# Patient Record
Sex: Female | Born: 1935 | Race: White | Hispanic: No | Marital: Married | Smoking: Former smoker
Health system: Southern US, Community
[De-identification: ages and names within clinical notes are randomized; demographics above are authoritative.]

## PROBLEM LIST (undated history)

## (undated) DIAGNOSIS — E785 Hyperlipidemia, unspecified: Secondary | ICD-10-CM

## (undated) DIAGNOSIS — E119 Type 2 diabetes mellitus without complications: Secondary | ICD-10-CM

## (undated) DIAGNOSIS — I10 Essential (primary) hypertension: Secondary | ICD-10-CM

## (undated) DIAGNOSIS — N390 Urinary tract infection, site not specified: Secondary | ICD-10-CM

## (undated) DIAGNOSIS — D649 Anemia, unspecified: Secondary | ICD-10-CM

## (undated) DIAGNOSIS — G20 Parkinson's disease: Secondary | ICD-10-CM

## (undated) DIAGNOSIS — F17201 Nicotine dependence, unspecified, in remission: Secondary | ICD-10-CM

## (undated) DIAGNOSIS — I471 Supraventricular tachycardia: Secondary | ICD-10-CM

## (undated) DIAGNOSIS — G459 Transient cerebral ischemic attack, unspecified: Secondary | ICD-10-CM

## (undated) DIAGNOSIS — N200 Calculus of kidney: Secondary | ICD-10-CM

## (undated) DIAGNOSIS — G20A1 Parkinson's disease without dyskinesia, without mention of fluctuations: Secondary | ICD-10-CM

## (undated) DIAGNOSIS — K219 Gastro-esophageal reflux disease without esophagitis: Secondary | ICD-10-CM

## (undated) DIAGNOSIS — E669 Obesity, unspecified: Secondary | ICD-10-CM

## (undated) DIAGNOSIS — E039 Hypothyroidism, unspecified: Secondary | ICD-10-CM

## (undated) DIAGNOSIS — I2510 Atherosclerotic heart disease of native coronary artery without angina pectoris: Secondary | ICD-10-CM

## (undated) HISTORY — DX: Transient cerebral ischemic attack, unspecified: G45.9

## (undated) HISTORY — DX: Calculus of kidney: N20.0

## (undated) HISTORY — PX: TOTAL ABDOMINAL HYSTERECTOMY: SHX209

## (undated) HISTORY — PX: APPENDECTOMY: SHX54

## (undated) HISTORY — DX: Obesity, unspecified: E66.9

## (undated) HISTORY — PX: KIDNEY STONE SURGERY: SHX686

## (undated) HISTORY — DX: Anemia, unspecified: D64.9

## (undated) HISTORY — DX: Urinary tract infection, site not specified: N39.0

## (undated) HISTORY — DX: Supraventricular tachycardia: I47.1

## (undated) HISTORY — DX: Type 2 diabetes mellitus without complications: E11.9

## (undated) HISTORY — DX: Hypothyroidism, unspecified: E03.9

## (undated) HISTORY — DX: Hyperlipidemia, unspecified: E78.5

## (undated) HISTORY — DX: Parkinson's disease: G20

## (undated) HISTORY — DX: Nicotine dependence, unspecified, in remission: F17.201

## (undated) HISTORY — DX: Atherosclerotic heart disease of native coronary artery without angina pectoris: I25.10

## (undated) HISTORY — DX: Parkinson's disease without dyskinesia, without mention of fluctuations: G20.A1

## (undated) HISTORY — DX: Gastro-esophageal reflux disease without esophagitis: K21.9

---

## 1992-02-13 DIAGNOSIS — I2510 Atherosclerotic heart disease of native coronary artery without angina pectoris: Secondary | ICD-10-CM

## 1992-02-13 HISTORY — DX: Atherosclerotic heart disease of native coronary artery without angina pectoris: I25.10

## 2001-03-18 ENCOUNTER — Ambulatory Visit (HOSPITAL_COMMUNITY): Admission: RE | Admit: 2001-03-18 | Discharge: 2001-03-18 | Payer: Self-pay | Admitting: Cardiovascular Disease

## 2003-12-16 ENCOUNTER — Ambulatory Visit: Payer: Self-pay | Admitting: Family Medicine

## 2004-01-20 ENCOUNTER — Ambulatory Visit: Payer: Self-pay | Admitting: Family Medicine

## 2004-02-08 ENCOUNTER — Ambulatory Visit: Payer: Self-pay | Admitting: *Deleted

## 2004-02-13 DIAGNOSIS — I471 Supraventricular tachycardia, unspecified: Secondary | ICD-10-CM

## 2004-02-13 HISTORY — DX: Supraventricular tachycardia, unspecified: I47.10

## 2004-02-13 HISTORY — DX: Supraventricular tachycardia: I47.1

## 2004-02-13 HISTORY — PX: RADIOFREQUENCY ABLATION: SHX2290

## 2004-05-10 ENCOUNTER — Ambulatory Visit: Payer: Self-pay | Admitting: Family Medicine

## 2004-07-27 ENCOUNTER — Ambulatory Visit: Payer: Self-pay | Admitting: Family Medicine

## 2004-08-08 ENCOUNTER — Encounter: Admission: RE | Admit: 2004-08-08 | Discharge: 2004-10-03 | Payer: Self-pay | Admitting: Family Medicine

## 2004-08-17 ENCOUNTER — Ambulatory Visit: Payer: Self-pay | Admitting: Family Medicine

## 2004-08-29 ENCOUNTER — Ambulatory Visit: Payer: Self-pay | Admitting: Family Medicine

## 2004-09-25 ENCOUNTER — Ambulatory Visit: Payer: Self-pay | Admitting: Family Medicine

## 2004-11-16 ENCOUNTER — Ambulatory Visit: Payer: Self-pay | Admitting: Family Medicine

## 2004-11-22 ENCOUNTER — Ambulatory Visit: Payer: Self-pay | Admitting: Family Medicine

## 2004-12-20 ENCOUNTER — Ambulatory Visit: Payer: Self-pay | Admitting: Family Medicine

## 2004-12-20 ENCOUNTER — Inpatient Hospital Stay (HOSPITAL_COMMUNITY): Admission: EM | Admit: 2004-12-20 | Discharge: 2004-12-22 | Payer: Self-pay | Admitting: Emergency Medicine

## 2004-12-21 ENCOUNTER — Ambulatory Visit: Payer: Self-pay | Admitting: Internal Medicine

## 2005-01-02 ENCOUNTER — Ambulatory Visit: Payer: Self-pay | Admitting: Cardiology

## 2005-02-12 HISTORY — PX: LUMBAR SPINE SURGERY: SHX701

## 2005-02-17 ENCOUNTER — Ambulatory Visit: Payer: Self-pay | Admitting: Internal Medicine

## 2005-02-27 ENCOUNTER — Ambulatory Visit: Payer: Self-pay | Admitting: Family Medicine

## 2005-04-10 ENCOUNTER — Ambulatory Visit: Payer: Self-pay | Admitting: Family Medicine

## 2005-05-09 ENCOUNTER — Ambulatory Visit (HOSPITAL_COMMUNITY): Admission: RE | Admit: 2005-05-09 | Discharge: 2005-05-09 | Payer: Self-pay | Admitting: Neurosurgery

## 2005-05-17 ENCOUNTER — Ambulatory Visit: Payer: Self-pay | Admitting: Family Medicine

## 2005-06-08 ENCOUNTER — Ambulatory Visit: Payer: Self-pay | Admitting: Family Medicine

## 2005-06-27 ENCOUNTER — Ambulatory Visit: Payer: Self-pay | Admitting: Family Medicine

## 2005-07-05 ENCOUNTER — Ambulatory Visit: Payer: Self-pay | Admitting: Family Medicine

## 2005-07-23 ENCOUNTER — Inpatient Hospital Stay (HOSPITAL_COMMUNITY): Admission: RE | Admit: 2005-07-23 | Discharge: 2005-07-24 | Payer: Self-pay | Admitting: Neurosurgery

## 2005-09-14 ENCOUNTER — Ambulatory Visit (HOSPITAL_COMMUNITY): Admission: RE | Admit: 2005-09-14 | Discharge: 2005-09-14 | Payer: Self-pay | Admitting: Neurosurgery

## 2005-11-19 ENCOUNTER — Ambulatory Visit: Payer: Self-pay | Admitting: Family Medicine

## 2005-12-14 ENCOUNTER — Ambulatory Visit: Payer: Self-pay | Admitting: Family Medicine

## 2006-03-20 ENCOUNTER — Ambulatory Visit: Payer: Self-pay | Admitting: Family Medicine

## 2006-06-26 ENCOUNTER — Ambulatory Visit: Payer: Self-pay | Admitting: Family Medicine

## 2009-02-06 ENCOUNTER — Emergency Department (HOSPITAL_COMMUNITY): Admission: EM | Admit: 2009-02-06 | Discharge: 2009-02-06 | Payer: Self-pay | Admitting: Emergency Medicine

## 2010-01-17 ENCOUNTER — Emergency Department (HOSPITAL_COMMUNITY)
Admission: EM | Admit: 2010-01-17 | Discharge: 2010-01-17 | Payer: Self-pay | Source: Home / Self Care | Admitting: Emergency Medicine

## 2010-01-24 ENCOUNTER — Ambulatory Visit (HOSPITAL_COMMUNITY)
Admission: RE | Admit: 2010-01-24 | Discharge: 2010-01-24 | Payer: Self-pay | Source: Home / Self Care | Attending: Family Medicine | Admitting: Family Medicine

## 2010-02-07 ENCOUNTER — Ambulatory Visit: Payer: Self-pay | Admitting: Internal Medicine

## 2010-03-01 ENCOUNTER — Ambulatory Visit (HOSPITAL_COMMUNITY)
Admission: RE | Admit: 2010-03-01 | Discharge: 2010-03-01 | Payer: Self-pay | Source: Home / Self Care | Attending: Internal Medicine | Admitting: Internal Medicine

## 2010-03-01 ENCOUNTER — Ambulatory Visit: Admit: 2010-03-01 | Payer: Self-pay | Admitting: Internal Medicine

## 2010-03-06 LAB — GLUCOSE, CAPILLARY: Glucose-Capillary: 98 mg/dL (ref 70–99)

## 2010-03-17 NOTE — Op Note (Signed)
  Jennifer [name], ACHORN                ACCOUNT NO.:  [id number]  MEDICAL RECORD NO.:  [id number]          PATIENT TYPE:  AMB  LOCATION:  DAY                           FACILITY:  [facility]  PHYSICIAN:  Lionel December, M.D.    DATE OF BIRTH:  11-01-1930  DATE OF PROCEDURE:  03/01/2010 DATE OF DISCHARGE:  03/01/2010                              OPERATIVE REPORT   INDICATION:  [name] is a 77 year old Caucasian female with chronic GERD, whose heart burn was well controlled with Nexium and she has recurrent solid food dysphagia.  She has Parkinson disease.  She had barium study which suggest motility disorder.  She is undergoing EGD to make sure she does not have ring or stricture that could be easily treated to improve her dysphagia.  Procedure risks were reviewed with the patient. Informed consent was obtained.  MEDICATIONS FOR CONSCIOUS SEDATION: 1. Cetacaine spray for pharyngeal topical anesthesia. 2. Demerol 20 mg IV. 3. Versed 3 mg IV.  FINDINGS:  Procedure performed in endoscopy suite.  The patient's vital signs and O2 sat were monitored during the procedure and remained stable.  The patient was placed in left lateral recumbent position and Pentax videoscope was passed through oropharynx without any difficulty into esophagus.  Esophagus, mucosa of the esophagus normal.  Body of the esophagus was somewhat tortuous.  GE junction located at 30 cm from the incisors with a prominent ring.  Scope was easily passed across into the herniated part of the stomach.  Hiatus was at 34 cm.  Hernia was felt to be moderate size.  Stomach was emptied and distended very well with insufflation.  Folds of proximal stomach was normal.  Examination of the mucosa, body, antrum, pyloric channel as well as angularis, fundus and cardia was normal.  Duodenum and bulbar mucosa was normal.  Scope was passed to the second part of duodenum where mucosa and folds were normal.  Endoscope was withdrawn.  Esophagus  was dilated by passing 54-French Maloney dilator with full insertion.  As the dilators withdrawn, endoscope was passed again and esophageal mucosa reexamined and ring was noted to be disrupted.  Endoscope was withdrawn.  The patient tolerated the procedure well.  FINAL DIAGNOSES: 1. Schatzki ring which was dilated, disrupted by passing 54-French     Maloney dilator. 2. Moderate size sliding hiatal hernia.  RECOMMENDATIONS: 1. The patient will resume her Plavix and aspirin later this evening.     She will continue antireflux measures.  She has to chew her food     thoroughly since she may have an element of insufficient motility     disorder. 2. Prescription written for Nexium 40 mg p.o. q.a.m. 30 with 11     refills.     Lionel December, M.D.     NR/MEDQ  D:  03/01/2010  T:  03/02/2010  Job:  [id number]  cc:   Ladona Horns. Mariel Sleet, MD Fax: [phone]  Electronically Signed by Lionel December M.D. on 03/17/2010 12:57:53 PM

## 2010-04-25 LAB — BASIC METABOLIC PANEL
BUN: 21 mg/dL (ref 6–23)
CO2: 24 mEq/L (ref 19–32)
Calcium: 9.4 mg/dL (ref 8.4–10.5)
Chloride: 108 mEq/L (ref 96–112)
Creatinine, Ser: 1.08 mg/dL (ref 0.4–1.2)
GFR calc Af Amer: 60 mL/min — ABNORMAL LOW (ref >60)
GFR calc non Af Amer: 49 mL/min — ABNORMAL LOW (ref >60)
Glucose, Bld: 86 mg/dL (ref 70–99)
Potassium: 4 mEq/L (ref 3.5–5.1)
Sodium: 141 mEq/L (ref 135–145)

## 2010-04-25 LAB — POCT CARDIAC MARKERS
CKMB, poc: <1 ng/mL — ABNORMAL LOW (ref 1.0–8.0)
Myoglobin, poc: 67.6 ng/mL (ref 12–200)
Troponin i, poc: <0.05 ng/mL (ref 0.00–0.09)

## 2010-04-25 LAB — CBC
HCT: 35.6 % — ABNORMAL LOW (ref 36.0–46.0)
Hemoglobin: 11.8 g/dL — ABNORMAL LOW (ref 12.0–15.0)
MCH: 29.4 pg (ref 26.0–34.0)
MCHC: 33.1 g/dL (ref 30.0–36.0)
MCV: 88.6 fL (ref 78.0–100.0)
Platelets: 188 10*3/uL (ref 150–400)
RBC: 4.02 MIL/uL (ref 3.87–5.11)
RDW: 14.2 % (ref 11.5–15.5)
WBC: 5.3 10*3/uL (ref 4.0–10.5)

## 2010-06-27 ENCOUNTER — Observation Stay (HOSPITAL_COMMUNITY)
Admission: EM | Admit: 2010-06-27 | Discharge: 2010-06-28 | Disposition: A | Discharge status: Home / Self Care | Payer: Medicare Other | Source: Ambulatory Visit | Attending: Internal Medicine | Admitting: Internal Medicine

## 2010-06-27 ENCOUNTER — Encounter (HOSPITAL_COMMUNITY): Payer: Self-pay | Admitting: Radiology

## 2010-06-27 ENCOUNTER — Emergency Department (HOSPITAL_COMMUNITY): Payer: Medicare Other

## 2010-06-27 DIAGNOSIS — I2510 Atherosclerotic heart disease of native coronary artery without angina pectoris: Secondary | ICD-10-CM | POA: Insufficient documentation

## 2010-06-27 DIAGNOSIS — D696 Thrombocytopenia, unspecified: Secondary | ICD-10-CM | POA: Insufficient documentation

## 2010-06-27 DIAGNOSIS — G20 Parkinson's disease: Secondary | ICD-10-CM | POA: Insufficient documentation

## 2010-06-27 DIAGNOSIS — I10 Essential (primary) hypertension: Secondary | ICD-10-CM | POA: Insufficient documentation

## 2010-06-27 DIAGNOSIS — E875 Hyperkalemia: Secondary | ICD-10-CM | POA: Insufficient documentation

## 2010-06-27 DIAGNOSIS — D649 Anemia, unspecified: Secondary | ICD-10-CM | POA: Insufficient documentation

## 2010-06-27 DIAGNOSIS — N390 Urinary tract infection, site not specified: Secondary | ICD-10-CM | POA: Insufficient documentation

## 2010-06-27 DIAGNOSIS — K219 Gastro-esophageal reflux disease without esophagitis: Secondary | ICD-10-CM | POA: Insufficient documentation

## 2010-06-27 DIAGNOSIS — G20A1 Parkinson's disease without dyskinesia, without mention of fluctuations: Secondary | ICD-10-CM | POA: Insufficient documentation

## 2010-06-27 DIAGNOSIS — R0789 Other chest pain: Principal | ICD-10-CM | POA: Insufficient documentation

## 2010-06-27 DIAGNOSIS — Z79899 Other long term (current) drug therapy: Secondary | ICD-10-CM | POA: Insufficient documentation

## 2010-06-27 DIAGNOSIS — E119 Type 2 diabetes mellitus without complications: Secondary | ICD-10-CM | POA: Insufficient documentation

## 2010-06-27 HISTORY — DX: Essential (primary) hypertension: I10

## 2010-06-27 LAB — GLUCOSE, CAPILLARY
Glucose-Capillary: 65 mg/dL — ABNORMAL LOW (ref 70–99)
Glucose-Capillary: 156 mg/dL — ABNORMAL HIGH (ref 70–99)
Glucose-Capillary: 155 mg/dL — ABNORMAL HIGH (ref 70–99)
Glucose-Capillary: 101 mg/dL — ABNORMAL HIGH (ref 70–99)
Glucose-Capillary: 58 mg/dL — ABNORMAL LOW (ref 70–99)

## 2010-06-27 LAB — URINALYSIS, ROUTINE W REFLEX MICROSCOPIC
Bilirubin Urine: NEGATIVE
Glucose, UA: NEGATIVE mg/dL
Ketones, ur: NEGATIVE mg/dL
Nitrite: POSITIVE — AB
Protein, ur: 30 mg/dL — AB
Specific Gravity, Urine: 1.02 (ref 1.005–1.030)
Urobilinogen, UA: 0.2 mg/dL (ref 0.0–1.0)
pH: 6.5 (ref 5.0–8.0)

## 2010-06-27 LAB — DIFFERENTIAL
Basophils Absolute: 0 10*3/uL (ref 0.0–0.1)
Basophils Relative: 1 % (ref 0–1)
Eosinophils Absolute: 0.7 10*3/uL (ref 0.0–0.7)
Eosinophils Relative: 13 % — ABNORMAL HIGH (ref 0–5)
Lymphocytes Relative: 24 % (ref 12–46)
Lymphs Abs: 1.2 10*3/uL (ref 0.7–4.0)
Monocytes Absolute: 0.4 10*3/uL (ref 0.1–1.0)
Monocytes Relative: 8 % (ref 3–12)
Neutro Abs: 2.7 10*3/uL (ref 1.7–7.7)
Neutrophils Relative %: 54 % (ref 43–77)

## 2010-06-27 LAB — BASIC METABOLIC PANEL
BUN: 20 mg/dL (ref 6–23)
CO2: 24 mEq/L (ref 19–32)
Calcium: 9.8 mg/dL (ref 8.4–10.5)
Chloride: 106 mEq/L (ref 96–112)
Creatinine, Ser: 1.15 mg/dL (ref 0.4–1.2)
GFR calc Af Amer: 55 mL/min — ABNORMAL LOW (ref >60)
GFR calc non Af Amer: 46 mL/min — ABNORMAL LOW (ref >60)
Glucose, Bld: 109 mg/dL — ABNORMAL HIGH (ref 70–99)
Potassium: 5.1 mEq/L (ref 3.5–5.1)
Sodium: 140 mEq/L (ref 135–145)

## 2010-06-27 LAB — PROTIME-INR
INR: 0.89 (ref 0.00–1.49)
Prothrombin Time: 12.2 seconds (ref 11.6–15.2)

## 2010-06-27 LAB — CARDIAC PANEL(CRET KIN+CKTOT+MB+TROPI): Troponin I: <0.3 ng/mL (ref <0.30)

## 2010-06-27 LAB — CBC
HCT: 36.6 % (ref 36.0–46.0)
Hemoglobin: 11.7 g/dL — ABNORMAL LOW (ref 12.0–15.0)
MCH: 28.6 pg (ref 26.0–34.0)
MCHC: 32 g/dL (ref 30.0–36.0)
MCV: 89.5 fL (ref 78.0–100.0)
Platelets: 163 10*3/uL (ref 150–400)
RBC: 4.09 MIL/uL (ref 3.87–5.11)
RDW: 14.8 % (ref 11.5–15.5)
WBC: 5 10*3/uL (ref 4.0–10.5)

## 2010-06-27 LAB — POCT CARDIAC MARKERS
CKMB, poc: <1 ng/mL — ABNORMAL LOW (ref 1.0–8.0)
Myoglobin, poc: 59.9 ng/mL (ref 12–200)
Troponin i, poc: <0.05 ng/mL (ref 0.00–0.09)

## 2010-06-27 LAB — CK TOTAL AND CKMB (NOT AT ARMC)
CK, MB: 1.2 ng/mL (ref 0.3–4.0)
Relative Index: INVALID (ref 0.0–2.5)
Total CK: 35 U/L (ref 7–177)

## 2010-06-27 LAB — URINE MICROSCOPIC-ADD ON

## 2010-06-27 LAB — APTT: aPTT: 27 seconds (ref 24–37)

## 2010-06-27 LAB — TROPONIN I: Troponin I: <0.3 ng/mL (ref <0.30)

## 2010-06-28 DIAGNOSIS — I517 Cardiomegaly: Secondary | ICD-10-CM

## 2010-06-28 LAB — BASIC METABOLIC PANEL
BUN: 19 mg/dL (ref 6–23)
Creatinine, Ser: 1.21 mg/dL — ABNORMAL HIGH (ref 0.4–1.2)
GFR calc non Af Amer: 43 mL/min — ABNORMAL LOW (ref >60)

## 2010-06-28 LAB — COMPREHENSIVE METABOLIC PANEL
ALT: 6 U/L (ref 0–35)
AST: 10 U/L (ref 0–37)
Albumin: 3.1 g/dL — ABNORMAL LOW (ref 3.5–5.2)
Chloride: 107 mEq/L (ref 96–112)
Creatinine, Ser: 1.12 mg/dL (ref 0.4–1.2)
GFR calc Af Amer: 57 mL/min — ABNORMAL LOW (ref >60)
Sodium: 140 mEq/L (ref 135–145)
Total Bilirubin: 0.2 mg/dL — ABNORMAL LOW (ref 0.3–1.2)

## 2010-06-28 LAB — LIPID PANEL
HDL: 44 mg/dL (ref >39)
LDL Cholesterol: 49 mg/dL (ref 0–99)
Triglycerides: 131 mg/dL (ref <150)

## 2010-06-28 LAB — DIFFERENTIAL
Basophils Absolute: 0 10*3/uL (ref 0.0–0.1)
Basophils Relative: 1 % (ref 0–1)
Eosinophils Absolute: 0.7 10*3/uL (ref 0.0–0.7)
Monocytes Relative: 8 % (ref 3–12)
Neutro Abs: 2.4 10*3/uL (ref 1.7–7.7)
Neutrophils Relative %: 47 % (ref 43–77)

## 2010-06-28 LAB — CARDIAC PANEL(CRET KIN+CKTOT+MB+TROPI)
CK, MB: 1.2 ng/mL (ref 0.3–4.0)
CK, MB: 1.2 ng/mL (ref 0.3–4.0)
Relative Index: INVALID (ref 0.0–2.5)
Total CK: 27 U/L (ref 7–177)
Total CK: 29 U/L (ref 7–177)
Troponin I: <0.3 ng/mL (ref <0.30)
Troponin I: <0.3 ng/mL (ref <0.30)

## 2010-06-28 LAB — CBC
MCH: 28.4 pg (ref 26.0–34.0)
Platelets: 143 10*3/uL — ABNORMAL LOW (ref 150–400)
RBC: 3.8 MIL/uL — ABNORMAL LOW (ref 3.87–5.11)
WBC: 5.2 10*3/uL (ref 4.0–10.5)

## 2010-06-28 LAB — GLUCOSE, CAPILLARY

## 2010-06-28 LAB — IRON AND TIBC: Iron: 46 ug/dL (ref 42–135)

## 2010-06-28 LAB — FERRITIN: Ferritin: 14 ng/mL (ref 10–291)

## 2010-06-29 LAB — URINE CULTURE
Colony Count: >=100000
Culture  Setup Time: 201205151855

## 2010-06-29 LAB — HEMOGLOBIN A1C: Mean Plasma Glucose: 143 mg/dL — ABNORMAL HIGH (ref <117)

## 2010-06-30 NOTE — Cardiovascular Report (Signed)
NAMEDENEENE, TARVER                ACCOUNT NO.:  [id number]   MEDICAL RECORD NO.:  [id number]          PATIENT TYPE:  INP   LOCATION:  2018                         FACILITY:  [facility]   PHYSICIAN:  Charlies Constable, M.D. [facility] DATE OF BIRTH:  1930-12-01   DATE OF PROCEDURE:  12/22/2004  DATE OF DISCHARGE:  12/22/2004                              CARDIAC CATHETERIZATION   PROCEDURE:  Cardiac catheterization.   CLINICAL HISTORY:  Ms. Foote is 77 years old and had a remote PTCA of the  diagonal branch by Dr. Tresa Endo in 1994. She was admitted with chest pain and  atrial flutter. She underwent a flutter ablation yesterday. An attempt was  made to access the right femoral artery for catheterization, but this was  unsuccessful and she was brought back today for catheterization via the left  femoral artery approach.   PROCEDURE NOTE:  The procedure via the left femoral artery using arterial  sheath and 6-French preformed coronary catheters. A front wall arterial  punctured and Omnipaque contrast was used. Distal aortogram was performed to  rule out abdominal aortic aneurysm. We were unable close the left femoral  artery due to a stick below the bifurcation. The patient tolerated the  procedure well and left the laboratory in satisfactory condition.   RESULTS:  1.  The aortic pressure was 115/58 with a mean of 82 and the left      ventricular pressure was 115/10.  2.  The left main coronary artery has 30% distal stenosis.  3.  The left anterior descending artery gave rise to an early diagonal      branch, septal perforators, and a second diagonal branch. The LAD and      diagonal branch were irregular and somewhat small in caliber. There was      no significant obstruction.  4.  The circumflex artery gave rise to a ramus branch, marginal branch, and      a small posterolateral branch.  These vessels were free of significant      disease.  5.  The right coronary artery is a moderate size vessel and  had a large      shepherd's crook. The right coronary artery gave rise to right      ventricular branches, a posterior descending branch and a posterolateral      branch. There were 50% and 40% in the proximal right coronary artery and      irregularities in the proximal and mid right coronary artery.   LEFT VENTRICULOGRAM:  The left ventriculogram performed in the RAO  projection showed good wall motion with no evidence of hypokinesis. The  estimated ejection fraction was 60%.   DISTAL AORTOGRAM:  A distal aortogram was performed which showed a 70%  stenosis in the sub branch of the left renal artery. The right renal artery  was patent. There was no significant aortoiliac obstruction.   CONCLUSION:  Nonobstructive coronary artery disease with 30% narrowing in  the distal left main, irregularities in the LAD and diagonal branch, no  significant obstruction in the circumflex artery, 50% and 40% in the  proximal right coronary artery, and normal left ventricular function.   RECOMMENDATIONS:  Re-assurance. Will plan discharge later today with a  follow-up groin check in the office in a week and follow up with Dr. Ladona Ridgel  in six weeks, and follow up with Dr. Eden Emms after that.           ______________________________  Charlies Constable, M.D. [facility]     BB/MEDQ  D:  12/22/2004  T:  12/22/2004  Job:  [id number]   cc:   Delaney Meigs, M.D.  Fax: [phone]   Charlton Haws, M.D.  [phone] [address]  [address]  [place]  [place] [address]   Doylene Canning. Ladona Ridgel, M.D.  [address]  [address]  [place]  [place] [address]

## 2010-06-30 NOTE — Cardiovascular Report (Signed)
[place]. [facility]  Patient:    Jennifer [name], Jennifer [name] Visit Number: [id number] MRN: [id number]          Service Type: CAT Location: [facility] 2852 [id number] Attending Physician:  Colon Branch Dictated by:   Noralyn Pick Eden Emms, M.D. [facility] Proc. Date: 03/18/01 Admit Date:  03/18/2001                          Cardiac Catheterization  PROCEDURE:  Heart catheterization.  CARDIOLOGIST:  Noralyn Pick. Eden Emms, M.D. [facility]  INDICATION:  Previous angioplasty of right coronary artery and LAD many years ago in 1994.  The patient is without any chest pain.  Cardiolite study suggested possibility of apical ischemia.  RESULTS:  The left main coronary artery had 20% discrete stenosis.  Left anterior descending artery had 20% multiple discrete stenoses in the proximal and mid portion.  First diagonal branch had 20% ostial lesion.  The circumflex coronary artery was normal.  The right coronary artery had 30% multiple discrete lesions proximally.  There was a 50% tubular lesion in the mid vessel.  This vessel was normal size and diseased.  RAO VENTRICULOGRAPHY:  RAO ventriculography was normal.  Ejection fraction was in the 60% range.  There was no gradient across the aortic valve and no MR. lv pressure was in the 164/19 range.  Aortic pressure was in the 162/71 range.  IMPRESSION:  The patient needs to stop smoking.  She will likely have progression of her right coronary artery disease over the next few years if she continues to smoke.  Currently there is no flow-limiting lesion and, particularly, there is no flow-limiting lesion corresponding to the apical on Cardiolite study.  On Cardiolite study, there was no inferior ischemia. Continued medical therapy is warranted including treatment of her hypercholesterolemia with Zocor.  She will be discharged home later today for medical followup. Dictated by:   Noralyn Pick Eden Emms, M.D. [facility] Attending Physician:  Colon Branch DD:   03/18/01 TD:  03/18/01 Job: [id number]

## 2010-06-30 NOTE — H&P (Signed)
Jennifer [name], Jennifer [name]                ACCOUNT NO.:  [id number]   MEDICAL RECORD NO.:  [id number]          PATIENT TYPE:  INP   LOCATION:  2018                         FACILITY:  [facility]   PHYSICIAN:  Arvilla Meres, M.D. LHCDATE OF BIRTH:  1931/01/09   DATE OF ADMISSION:  12/20/2004  DATE OF DISCHARGE:                                HISTORY & PHYSICAL   CHIEF COMPLAINT:  Chest pain.   HISTORY OF PRESENT ILLNESS:  Jennifer [name] is a 77 year old female with a  history of heart disease who had onset of chest pain three days ago.  She  has had multiple episodes, each relieved with sublingual nitroglycerin x1,  however, she had chest pain that started substernally and reached an 8/10.  It was associated with shortness of breath and mild diaphoresis and nausea.  She took one of her husband's sublingual nitroglycerin which had worked but  it did not work.  She saw her primary care physician who evaluated her and  realized that she was in SVT as well as having ongoing chest pain.  She was  transported by EMS urgently to [facility].  En route,  she received medication, probably adenosine, and she is now in sinus rhythm.  After her tachycardia resolved, her chest pain was also relieved.   She has no awareness of increased  heart rate.  She said the pain radiated  to both arms and she was having such severe pain and shortness of breath  that she was not aware of any tachy palpitations.  She has no history of  tachy palpitations.  She was in her usual state of health until three days  ago when the chest pain episodes began.  She is currently pain-free.   ALLERGIES:  PENICILLIN, CODEINE, DEMEROL, and SEPTRA.   MEDICATIONS:  1.  Aspirin 325 mg daily.  2.  Metoprolol 50 mg b.i.d.  3.  Glucovance 2.5/5 two tablets b.i.d.  4.  Plavix 75 mg daily.  5.  Synthroid 75 mcg daily.  6.  Actos 45 mg daily.  7.  Prinivil 5 mg one half tablet daily.  8.  Vytorin 10/40 daily.   PAST  MEDICAL HISTORY:  1.  She is status post percutaneous intervention to the RCA and LAD in 1994.      No further details available.  2.  Status post catheterization in 2003 with a 20% left main, 20% LAD, 20%      D1, RCA with 30 and 50% lesions and EF of 60%.  3.  Hyperlipidemia.  4.  Hypertension.  5.  Diabetes.  6.  Ongoing tobacco use.  7.  Family history of coronary artery disease.  8.  History of TIA.  9.  Hypothyroidism.  10. Depression.   PAST SURGICAL HISTORY:  1.  Status post cardiac catheterization x2.  2.  Appendectomy.  3.  Hysterectomy.   SOCIAL HISTORY:  She lives in [place], [place], with her husband and  is a retired Engineer, petroleum.  She has a greater than 50-pack-year history  of tobacco use, denies alcohol and drug abuse.  FAMILY HISTORY:  Father diet at age 59 with heart disease and her mother  died at age 22 with heart disease and she has one sibling with heart disease  as well.   REVIEW OF SYSTEMS:  She has diaphoresis today but no other fevers, chills or  sweats.  She has some hearing loss and dentition is poor.  She has no rashes  or lesions.  Chest pain is described above.  She has an occasional cough but  denies wheezing and denies edema or palpitations.  There is no presyncope or  syncope.  She has no orthopnea or PND but has chronic dyspnea on exertion.  She has stress incontinence.  She has arthralgias.  She has no melena,  hematemesis or hemoptysis.  Review of systems is otherwise negative.   PHYSICAL EXAMINATION:  GENERAL APPEARANCE:  She is a well-developed, elderly  white female in no acute distress.  HEENT:  Her head is normocephalic and atraumatic with pupils are equal,  round, reactive to light and accommodation.  Extraocular movements intact.  Sclerae are clear.  Nares without discharge.  Dentition is poor.  NECK:  There is no JVD, no thyromegaly, no carotid bruits are appreciated.  LUNGS:  Clear to auscultation bilaterally.   CARDIOVASCULAR:  Her heart is regular in rate and rhythm with no significant  murmurs, rubs, or gallops noted.  Distal pulses are 2+ and bilateral femoral  bruits are appreciated.  ABDOMEN:  Obese, soft and nontender with active bowel sounds, no  hepatosplenomegaly.  EXTREMITIES:  No clubbing, cyanosis, or edema noted.  SKIN:  No rashes or lesions are noted.  MUSCULOSKELETAL:  There is no joint deformity or effusions and no spine or  CVA tenderness.  NEUROLOGIC:  She is alert and oriented.  Cranial nerves II-XII grossly  intact.   EKG done by EMS shows supraventricular tachycardia, rate 152 with no acute  ischemic changes.   Chest x-ray and labs are pending at the time of dictation.   IMPRESSION:  Jennifer [name] is a 77 year old female with known coronary artery  disease who is status post remote percutaneous intervention.  She is  admitted today with a three day history of intermittent chest pain and  shortness of breath that is worse with exertion.  Her pain was worse today  and she went to her primary care physician and was found to be in  supraventricular tachycardia, rates 150 to 170.  She apparently broke with  adenosine and is currently pain-free.  Her chest pain may be secondary to  intermittent supraventricular tachycardia, but need to rule out progression  of coronary artery disease.  We will therefore:  1.  Admit to rule out myocardial infarction.  2.  Use beta-blockade, aspirin and heparin.  3.  Cardiac catheterization hopefully tomorrow.  4.  Electrophysiology to see for consideration of ablation.  5.  Check echo and TSH.   Dr. Gala Romney saw the patient and determined the plan of care.      Theodore Demark, P.A. [facility]      Arvilla Meres, M.D. [facility]  Electronically Signed    RB/MEDQ  D:  12/20/2004  T:  12/21/2004  Job:  [id number]   cc:   Delaney Meigs, M.D.  Fax: [phone]   Charlton Haws, M.D. [phone] [address]  [address]  [place]  [place] [address]

## 2010-06-30 NOTE — Op Note (Signed)
Jennifer [name], [name]                ACCOUNT NO.:  [id number]   MEDICAL RECORD NO.:  [id number]          PATIENT TYPE:  INP   LOCATION:  2018                         FACILITY:  [facility]   PHYSICIAN:  Doylene Canning. Ladona Ridgel, M.D.  DATE OF BIRTH:  05/02/1930   DATE OF PROCEDURE:  12/21/2004  DATE OF DISCHARGE:                                 OPERATIVE REPORT   PROCEDURE PERFORMED:  Electrophysiologic study and radiofrequency catheter  ablation of atrial tachycardia.   INTRODUCTION:  The patient is a 77 year old woman who was admitted to  hospital with right arm, left arm and substernal chest pain and shortness of  breath, and was essentially found to be in SVT at 160 beats per minute.  She  was reported terminated with intravenous adenosine, though we do not know  with the time sequence was with her adenosine termination as the patient did  not feel palpitations per se with her SVT but rather had chest and arm  discomfort (angina).  She was admitted to hospital, where serial cardiac  enzymes demonstrated no obvious myocardial injury.  She is now referred for  electrophysiologic study and catheter ablation.   PROCEDURE:  After informed consent was obtained, the patient was taken to  the diagnostic EP lab in the fasting state.  After the usual preparation and draping, intravenous fentanyl and midazolam  were given for sedation.  Initially attempts to cannulate the right jugular  vein were carried out, but a guidewire could not be advanced.  A 5-French  quadripolar catheter was inserted percutaneously in the right femoral vein  and advanced the RV apex and a 5-French quadripolar catheter was inserted  percutaneously in the right femoral vein and advanced to the His bundle  region.  A 6-French hexapolar catheter was inserted percutaneously in the  right femoral vein and advanced to the coronary sinus.  Pacing was also  carried out from the RV apex and stepwise decreased down to 490 msec, where  VA  Wenckebach was observed.  During rapid ventricular pacing, the atrial  activation was midline and decremental.  Next, programmed ventricular  stimulation was carried out from the RV apex at a base drive cycle length of  191 msec.  The S1-S2 interval was stepwise decreased down to 520 msec, where  the retrograde AV node ERP was observed.  During programmed ventricular  stimulation, the atrial activation was midline and decremental.  Next,  programmed atrial stimulation was carried out from the coronary sinus as  well as from the high right atrium at a base drive cycle length of 478 msec.  The S1-S2 interval was stepwise decreased from 440 msec down to 320 msec,  where the AV node ERP was observed.  It should be noted that during  programmed atrial stimulation there was reproducibly and easily inducible  SVT.  Details of this will follow.  Next, rapid atrial pacing was carried  out from the coronary sinus as well as the high right atrium at a pacing  cycle length of 600 msec and stepwise decreased down to 420 msec, where AV  Wenckebach was  observed.  During rapid atrial pacing the PR interval was  less than the RR interval.  There was also inducible SVT with rapid atrial  pacing at decrements down around 360 msec.  It should be noted that during tachycardia, the cycle length was 440 msec.  Mapping was carried out demonstrating the earliest atrial activation in the  high right atrium.  This was demonstrated when 20-pole halo catheter was  inserted percutaneously through the right femoral vein and advanced to the  right atrium.  At this point ventricular pacing was carried out during  tachycardia, demonstrating a VA-AV conduction sequence consistent with  atrial tachycardia.  The ablation catheter, a 7-French quadripolar ablation  catheter, was inserted percutaneously through the right femoral vein and  advanced to the right atrium.  A total of six RF energy applications were  delivered to the  region of the high right atrium, resulting in termination  of the tachycardia.  Following catheter ablation, additional pacing was  carried out demonstrating no inducible SVT.  The patient was observed for  approximately 30 minutes and had additional atrial tachycardia.  The  catheters were removed, hemostasis was assured, and the patient was returned  to her room in satisfactory condition.   COMPLICATIONS:  There were no immediate procedure complications.   RESULTS:  A.  Baseline ECG:  Baseline ECG demonstrates sinus bradycardia,  normal axes and intervals.  B.  Baseline intervals:  The sinus node cycle length was 1015 msec, the PR  interval 134 msec, the QRS duration 81 msec, the HV interval was 49 msec,  the AH interval 68 msec.  C.  Rapid ventricular pacing:  Rapid atrial pacing was carried out from the  RV apex and demonstrated a VA Wenckebach cycle length of 490 msec.  During  rapid ventricular pacing, the atrial activation was midline and decremental.  D.  Programmed ventricular stimulation:  Programmed ventricular stimulation  was carried out from the RV apex at a base drive cycle of 045 msec.  The S1-  S2 interval was stepwise decreased down to 520 msec, where the retrograde AV  node ERP was observed.  During programmed ventricular stimulation, the  atrial activation was again midline and decremental.  E.  Rapid atrial pacing:  Rapid atrial pacing was carried out from the  coronary sinus as well as the high right atrium at pacing cycle length of  600 msec and stepwise decreased down to 420 msec, where AV Wenckebach was  observed.  Additional rapid atrial pacing down at 360 msec resulted in SVT.  F.  Programmed atrial stimulation:  Programmed atrial stimulation was  carried out from the high right atrium as well as the coronary sinus at a  basic drive cycle length of 409 msec.  The S1-S2 interval was stepwise decreased down to 320 msec, where the AV node ERP was observed.   During  programmed atrial stimulation, there was easily inducible SVT prior to  ablation.  G.  Arrhythmias observed:  Atrial tachycardia.  Initiation:  Rapid atrial  pacing and programmed atrial stimulation.  Duration was sustained.  Cycle  length 440 msec.  Method of termination was spontaneous and with RF  ablation.  H.  Mapping:  Mapping of the patient's atrial tachycardia demonstrated the  earliest atrial activation in the high right atrium near the sinus node  region.  1.  Radiofrequency energy application:  A total of six RF energy      applications were delivered to high right atrium  near the sinus node.      It should be noted during that RF energy application, the tachycardia      was terminated and it could not be induced.   CONCLUSION:  This study demonstrates successful electrophysiologic study and  radiofrequency catheter ablation of atrial tachycardia arising high in the  right atrium.  Six radiofrequency energy applications were delivered  resulting in rendering the tachycardia not inducible.           ______________________________  Doylene Canning. Ladona Ridgel, M.D.     GWT/MEDQ  D:  12/21/2004  T:  12/22/2004  Job:  [id number]   cc:   Delaney Meigs, M.D.  Fax: [phone]   Charlton Haws, M.D.  [phone] [address]  [address]  [place]  [place] [address]

## 2010-06-30 NOTE — Discharge Summary (Signed)
Jennifer [name], Jennifer [name]                ACCOUNT NO.:  [id number]   MEDICAL RECORD NO.:  [id number]          PATIENT TYPE:  INP   LOCATION:  2018                         FACILITY:  [facility]   PHYSICIAN:  Charlies Constable, M.D. [facility] DATE OF BIRTH:  Jun 17, 1930   DATE OF ADMISSION:  12/20/2004  DATE OF DISCHARGE:  12/22/2004                           DISCHARGE SUMMARY - REFERRING   SUMMARY OF HISTORY:  Jennifer [name] is a 77 year old white female who was  admitted on December 20, 2004, by Dr. Gala Romney after she presented with a  three day history of chest discomfort, each episode relieved with sublingual  nitroglycerin.  At one point, she took her husband's sublingual  nitroglycerin and this did not work.  Her primary care physician evaluated  her and found that she was in SVT with chest discomfort, thus, she was  transferred to [facility] for further evaluation.  Please note that  she was unaware of the rapid heart beat.   PAST MEDICAL HISTORY:  Notable for obesity, hyperlipidemia, hypertension,  diabetes, ongoing tobacco use, TIA, hypothyroidism, depression,  appendectomy, hysterectomy, status post catheterization with PCI to the RCA  and LAD in 2004.  The last catheterization in 2003 showed an EF of 60%, 20%  left main, 20% LAD, 20% diagonal one, 30-50% RCA lesions.   LABORATORY DATA:  Chest x-ray on November 8 showed cardiomegaly, no active  disease.  EKG on transfer showed supraventricular tachycardia with a rate of  167.  EMS found her to be in normal sinus rhythm.  Admission weight was  175.4.  H&H was 13.1 and 38.3, normal indices, platelets 176, WBC 5.2.  PTT  27, PT 11.8.  Sodium 138, potassium 4.2, BUN 14, creatinine 0.9, normal  LFTs.  Hemoglobin A1C was 6.8.  CK total MBs and troponins were negative x  3.  BNP was 38, fasting lipids showed a total cholesterol 122, triglycerides  63, HDL 59, LDL 50, TSH 5.058.   HOSPITAL COURSE:  Jennifer [name] was placed on IV heparin and admitted to  the  hospital for further evaluation.  She received Adenosine in the emergency  room  restoring sinus rhythm and relieved her discomfort.  EP consultation  was sought with Dr. Ladona Ridgel.  On December 21, 2004, Dr. Ladona Ridgel performed EP  study and ablation without difficulty.  Cardiac catheterization was also  performed on December 22, 2004, by Dr. Juanda Chance, this showed nonobstructive  coronary artery disease with a normal LV function.  It was felt that post  sheath removal and bedrest she could be discharged home post ambulation if  catheterization site was stable.   DISCHARGE DIAGNOSIS:  1.  Supraventricular tachycardia status post ablation.  2.  Unstable angina with nonobstructive coronary artery disease on cardiac      catheterization.   PROCEDURE:  1.  Cardiac catheterization on December 22, 2004, by Dr. Juanda Chance showed a 30%      distal left main, 50%/40% proximal /RCA, 60% LV function.  She was also      noted to have a left renal artery bifurcation with a 70% lesion in the  bifurcation branch.  2.  Status post SVT ablation on December 21, 2004.   DISPOSITION:  She is discharged home.  Asked to maintaining a low fat, low  salt, low cholesterol ADA diet.  She received supplemental instructions in  regards to her activity and wound are.  She was advised no smoking or  tobacco products.   DISCHARGE MEDICATIONS:  Aspirin 325 mg daily, Metoprolol 50 mg b.i.d.  She  was asked to resume her Glucovance 2.5/5, 2 tablets b.i.d. on Sunday p.m.  Plavix 75 mg daily, Synthroid 75 mcg daily, Actos 45 mg daily, Prinivil 45  mg 1/2 tablet daily, Vytorin 10/40 daily, nitroglycerin 0.4 mg p.r.n.   FOLLOW UP:  She will follow up with Dr. Fabio Bering PA, [facility], on  January 02, 2005, at 12:30.  She will follow up with Dr. Ladona Ridgel on February 16, 2005, at 11:30.  She was asked to follow up with Dr. Lysbeth Galas as needed.      Joellyn Rued, P.A. [facility]    ______________________________  Charlies Constable,  M.D. [facility]    EW/MEDQ  D:  12/22/2004  T:  12/22/2004  Job:  [id number]   cc:   Charlton Haws, M.D.  [address]  [address]  [place]  [place] [address]   Doylene Canning. Ladona Ridgel, M.D.  [address]  [address]  [place]  [place] [address]   Delaney Meigs, M.D.  Fax: [phone]

## 2010-06-30 NOTE — Op Note (Signed)
NAMEJAKI, Jennifer [name]                ACCOUNT NO.:  [id number]   MEDICAL RECORD NO.:  [id number]          PATIENT TYPE:  INP   LOCATION:  3007                         FACILITY:  [facility]   PHYSICIAN:  Kathaleen Maser. Pool, M.D.    DATE OF BIRTH:  December 20, 1930   DATE OF PROCEDURE:  07/23/2005  DATE OF DISCHARGE:                                 OPERATIVE REPORT   PREP DIAGNOSIS:  Right L2-3 spondylosis with stenosis and right L3-4  spondylosis with stenosis.   POSTOPERATIVE DIAGNOSIS:  Right L2-3 spondylosis with stenosis and right L3-  4 spondylosis with stenosis.   PROCEDURE NAME:  Right L2-3 and right L3-4 decompressive laminotomies with  foraminotomies.   SURGEON:  Kathaleen Maser. Pool, M.D.   ASSISTANT:  Donalee Citrin, M.D.   ANESTHESIA:  General oroendotracheal.   INDICATIONS:  Jennifer [name] is a 77 year old female with a history of severe  back and right lower extremity pain failing conservative management.  Workup  demonstrates evidence of marked spondylosis with stenosis at L2-3 and to a  lesser degree at  L3-4.  The patient has been counseled as her options.  Initially we considered doing just the L2-3 level, but on further review of  the studies; it is apparent the L3-4 level is also significantly involved.   OPERATIVE NOTE:  The patient was placed on the operating room table in the  supine position and after a significant level of anesthesia was achieved the  patient was placed prone onto the Wilson frame and appropriately padded.  The patient's lumbar region was prepped and draped sterilely.  A #10 blade  was used to make a linear skin incision overlying the L2-3-4 levels.  This  was carried down sharply in the midline.   A subperiosteal dissection was then performed exposing the lamina facet  joints at L2, L3, and L4.  Deep self-retaining retractor was then placed.  Intraoperative x-ray was taken; level was confirmed.  A laminotomy was then  performed using high-speed drill and Kerrison  rongeurs to remove the  inferior aspect of the lamina of L2, the medial aspect of the L3-4 facet  joint, the entire right hemi lamina of a L3 and the L3-4 facet joint, and  the superior aspect of the L4 lamina.  Ligament flavum was then elevated to  resect a piece of fascia using Kerrison rongeurs underlying the thecal sac;  exiting L3 and L4 nerve roots were identified.  Microscope was then brought  to field for microdissection, starting first at L3-4.  There is a broad  based spondylitic protrusion at L3-4 which was dissected free.  Thecal sac  and nerve roots were protected.  The bone spur was resected using osteotomes  and curettes.   Disk space was entered.  An aggressive diskectomy was performed.  A moderate  amount of free disk herniation extending into the foramen was encountered  and completely resected.  At this point a very thorough decompression and  discectomy had been performed at L2-3.  Attention then placed at L3-4.  There is a focal bulging in the disk just beneath the  L4 nerve root.  Thecal  sac and nerve roots were protected.  Disk space was then incised with a 15-  blade in a rectangular fashion.  A wide disk space cleanout was achieved  using pituitary rongeurs, up-biting pituitary rongeurs, and Epstein  curettes.  All elements of disk herniation were completely resected.  All  wounds were __________ and the disk material was removed from the  interspace.   At this point, the wound was then irrigated with antibiotic solution.  Gelfoam was placed topically for hemostasis and found to be good.  The  microscope and retractor system were removed.  Hemostasis was achieved with  electrocautery.  The wounds were then closed in layers with Vicryl sutures.  Steri-Strips and sterile dressings were applied.  There were no apparent  complications.  The patient tolerated the procedure well; and she returns to  the recovery room postoperatively.            ______________________________  Kathaleen Maser Pool, M.D.     HAP/MEDQ  D:  07/23/2005  T:  07/23/2005  Job:  [id number]

## 2010-07-03 NOTE — H&P (Signed)
Jennifer [name], Jennifer [name]                ACCOUNT NO.:  [id number]  MEDICAL RECORD NO.:  [id number]           PATIENT TYPE:  O  LOCATION:  A313                          FACILITY:  [facility]  PHYSICIAN:  Elliot Cousin, M.D.    DATE OF BIRTH:  11-04-1930  DATE OF ADMISSION:  06/27/2010 DATE OF DISCHARGE:  LH                             HISTORY & PHYSICAL   PRIMARY CARE PHYSICIAN:  Delaney Meigs, MD  GASTROENTEROLOGIST:  Lionel December, MD  CHIEF COMPLAINT:  Chest pain and generalized weakness.  HISTORY OF PRESENT ILLNESS:  The patient is a 77 year old woman with a past medical history significant for nonobstructive coronary artery disease, atrial tachycardia, status post ablation, hypertension, type 2 diabetes mellitus, and esophageal dysmotility.  She presents to the emergency department today with a chief complaint of chest pain and generalized weakness.  She has had chest pain substernally intermittently for the past 1-2 weeks.  Last night, the pain was constant.  She rates the pain as moderate on a scale of mild-to-severe. She says that the pain feels like a tightness.  She has had associated nausea, but no vomiting.  She has had radiation of the pain to her right arm.  She has had lightheadedness, although she acknowledges that she has chronic dizziness when she stands up.  Sometimes she has vertigo when she lays down.  She has had no difficulty swallowing, no sweating, cough, belching, chills, fever, pleurisy, or headache.  She did not eat this morning because of ill feeling.  She has had an increase in urinary frequency, but not necessarily pain when she urinates.  She has had constipation, but no diarrhea.  She denies bright red blood per rectum and black tarry stools.  In the emergency department, the patient is hemodynamically stable. Temperature is pending.  Her EKG reveals normal sinus rhythm with a heart rate of 71 beats per minute and some artifact in lead V. Otherwise,  there is low voltage QRS in the precordial leads.  Her chest x-ray reveals emphysematous and bronchitic changes, but no acute abnormalities.  Her initial cardiac markers are negative.  Her WBC is within normal limits at 5.0.  Her hemoglobin is slightly low at 11.7. Her urinalysis reveals trace blood, 30 protein, positive nitrite, and small leukocytes.  Her micro urine reveals too numerous to count wbc's and many bacteria.  She is being admitted for further evaluation and management.  PAST MEDICAL HISTORY: 1. Coronary artery disease, status post PTCA of the diagonal artery in     1994 by Dr. Tresa Endo.  Cardiac catheterization in November 2006 by Dr.     Charlies Constable revealed nonobstructive coronary artery disease and     normal LV function. 2. Left renal artery stenosis per cardiac catheterization in 2006. 3. Atrial tachycardia/supraventricular tachycardia status post     ablation by Dr. Lewayne Bunting in November 2006. 4. Hypertension. 5. Hyperlipidemia. 6. Type 2 diabetes mellitus. 7. Hypothyroidism. 8. Parkinson's disease. 9. Dysphagia, gastroesophageal reflux disease, hiatal hernia, and     Schatzki's ring, status post dilatation in January 2012 by Dr.     Karilyn Cota. 10.Esophageal  dysmotility per esophagram, December 2011. 11.Status post back surgeries x2 with associated neuropathy. 12.Status post hysterectomy.  MEDICATIONS: 1. Glyburide/metformin 2.5/500 mg 2 tablets b.i.d. 2. Plavix 75 mg daily. 3. Synthroid 0.05 mg daily. 4. Metoprolol tartrate 25 mg daily. 5. Aspirin 325 mg daily. 6. Sanctura XR 60 mg daily. 7. Furosemide 20 mg daily. 8. Nexium 40 mg daily. 9. Lisinopril 5 mg daily. 10.Crestor 10 mg b.i.d. 11.Lyrica 75 mg b.i.d. 12.Azilect 1 mg daily. 13.Celebrex 200 mg daily (the patient has not restarted yet).  ALLERGIES:  THE PATIENT HAS ALLERGIES TO CODEINE, PENICILLIN, AND REQUIP.  SOCIAL HISTORY:  The patient is married.  [place].  She has one daughter, Ms. Alona Bene.  She is retired.  She no longer drives.  She stopped smoking approximately 4 years ago after smoking half a pack of cigarettes for approximately 50 years.  She denies alcohol use.  No history of illicit drug use.  FAMILY HISTORY:  Her mother died of a heart attack.  She did have a history of congestive heart failure.  Her father died of a stroke.  He also had a history of heart disease.  REVIEW OF SYSTEMS:  The patient's review of systems is positive for bilateral leg pain, lower back pain, numbness in her legs, occasional difficulty swallowing, otherwise review of systems is negative.  PHYSICAL EXAMINATION:  VITAL SIGNS: Temperature pending, blood pressure 116/71, pulse 74, respiratory rate 20, oxygen saturation 96% on room air. GENERAL:  The patient is a pleasant 77 year old Caucasian woman, who is currently sitting up in bed, in no acute distress. HEENT:  Head is normocephalic, nontraumatic.  Pupils equal, round, and reactive to light.  Extraocular movements are intact.  Conjunctivae are clear.  Sclerae are white.  Tympanic membranes, not examined.  Nasal mucosa is mildly dry.  No sinus tenderness.  Oropharynx reveals mildly dry mucous membranes.  No posterior exudates or erythema. NECK:  Supple.  No adenopathy, no thyromegaly, no bruit, no JVD. LUNGS:  Decreased breath sounds in the bases, otherwise clear. Breathing is nonlabored. HEART:  S1 and S2 with no murmurs, rubs, or gallops. ABDOMEN:  Mildly obese, positive bowel sounds, soft, nontender, and nondistended.  No hepatosplenomegaly.  No masses palpated. GU AND RECTAL:  Deferred. EXTREMITIES:  Pedal pulses are palpable bilaterally.  No pretibial edema and no pedal edema. NEUROLOGIC:  The patient is alert and oriented x3.  Cranial nerves II through XII are intact.  There is bilateral upper extremity pin rolling tremor.  She does have parkinsonian facies.  Strength in the sitting  position is grossly 5-/5 globally.  Sensation is grossly intact. Her speech is clear.  She is cooperative.  LABORATORY DATA:  Admission laboratories, the results of the EKG and chest x-ray were dictated above.  Urinalysis reveals trace blood, 30 protein, nitrite positive, small leukocytes.  Sodium 140, potassium 5.1, chloride 106, CO2 of 24, glucose 109, BUN 20, creatinine 1.15, calcium 9.8.  WBC 5.0, hemoglobin 11.7, platelet count 163, CK-MB less than 1. Troponin-I less than 0.05, myoglobin 59.9.  ASSESSMENT: 1. Substernal chest pain.  The patient's chest pain appears to be     somewhat atypical.  She is certainly at risk for angina given her     history of coronary artery disease, although it has been deemed     nonobstructive.  The chest pain may also be a consequence of     gastroesophageal reflux disease as she has a history of  hiatal     hernia, Schatzki's ring, and esophageal dysmotility.  She denies     any recent history of dysphagia or pain when swallowing.  She is     treated chronically with Nexium. 2. Pyuria, consistent with urinary tract infection.  The patient has     no pain with urination, although she does complain of generalized     weakness and some urinary incontinence.  She does not recall having  a urinary tract infection before.  However, she takes Reunion     for urinary incontinence. 3. Mild normocytic anemia.  The patient's hemoglobin is borderline low     at 11.7. 4. Hypertension.  The patient's blood pressure is well within normal     limits.  She is treated chronically with metoprolol and lisinopril. 5. Hypothyroidism.  The patient is treated chronically with Synthroid. 6. Type 2 diabetes mellitus.  Her venous glucose is on the lower end     of normal for a patient with type 2 diabetes mellitus.  She is     treated chronically with glyburide and metformin.  She has not     eaten today and therefore she is at risk of hypoglycemia. 7. Parkinson's disease.   She has an active pin rolling tremor on exam.     She is treated chronically with Azilect. 8. As stated above, she does have a history of esophageal dysmotility,     gastroesophageal reflux disease, status post Schatzki's ring     dilatation in January 2012 by Dr. Karilyn Cota.  She is treated with     Nexium 40 mg daily.  PLAN: 1. We will admit her for 24-hour observation. 2. For further evaluation, we will order cardiac enzymes, thyroid     function test, fasting lipid profile, and a 2-D echocardiogram to     rule out wall motion abnormalities. 3. We will treat her symptomatically with as-needed morphine or as-     needed sublingual nitroglycerin.  The dose of Nexium and/or     Protonix will be increased to b.i.d. during the hospitalization. 4. We will start gentle hydration. 5. We will start Cipro intravenously and empirically.  We will ask the     lab to culture her urine prior to the administration of Cipro. 6. We will consult Cardiology and/or Gastroenterology only if needed.     Elliot Cousin, M.D.     DF/MEDQ  D:  06/27/2010  T:  06/27/2010  Job:  [id number]  cc:   Delaney Meigs, M.D. Fax: [phone]  Lionel December, M.D. Fax: [phone]  Electronically Signed by Elliot Cousin M.D. on 07/03/2010 05:28:02 PM

## 2010-07-03 NOTE — Discharge Summary (Signed)
Jennifer [name], Jennifer [name]                ACCOUNT NO.:  [id number]  MEDICAL RECORD NO.:  [id number]           PATIENT TYPE:  O  LOCATION:  A313                          FACILITY:  [facility]  PHYSICIAN:  Elliot Cousin, M.D.    DATE OF BIRTH:  May 05, 1930  DATE OF ADMISSION:  06/27/2010 DATE OF DISCHARGE:  05/16/2012LH                              DISCHARGE SUMMARY   DISCHARGE DIAGNOSES: 1. Chest pain.  Myocardial infarction ruled out.     History of nonobstructive CAD. 2. History of gastroesophageal reflux disease, esophageal dysmotility,     and hiatal hernia. 3. Type 2 diabetes mellitus with asymptomatic hypoglycemia. 4. Hyperkalemia, possibly secondary to ACE inhibitor therapy. 5. Nonsustained ventricular tachycardia, seen on     telemetry and PVCs on EKG. 6. Urinary tract infection. 7. History of hypothyroidism with iatrogenic hyperthyroidism. 8. Normocytic anemia.  The patient's hemoglobin was 10.8 prior to     discharge.  Anemia panel is pending. 9. Mild thrombocytopenia.  The patient's platelet count was 143 prior     to discharge. 10.Hypertension, well-controlled. 11.Parkinson disease, stable.  DISCHARGE MEDICATIONS: 1. Cipro 250 mg b.i.d. for 5 more days. 2. Multivitamin with iron once daily. 3. Lansoprazole 30 mg.  The dose was increased from once daily to     twice daily. 4. Levothyroxine.  The dose was decreased from 100 mcg to 88 mcg     daily. 5. Aspirin 325 mg daily. 6. Azilect 1 mg daily. 7. Celebrex 200 mg daily as needed for pain. 8. Crestor 10 mg daily. 9. Fluticasone Diskus 50 mcg 1 spray nasally twice daily. 10.Glyceride/metformin 2.5/500 mg 1 tablet b.i.d. 11.Lyrica 75 mg b.i.d. 12.Metoprolol 50 mg daily. 13.Plavix 75 mg daily. 14.Pramipexole 0.125 mg 3 times daily. 15.Sanctura XR 60 mg daily. 16.Stop lisinopril. 17.Stop furosemide.  DISCHARGE DISPOSITION:  The patient was discharged to home in improved and stable condition on Jun 28, 2010.  She will  follow up with her primary care physician, Dr. Lysbeth Galas on July 14, 2010, at 9:45 a.m. and with cardiologist, Dr. [place] Bing on July 21, 2010, at 1:15 p.m.  CONSULTATIONS:  None.  PROCEDURE PERFORMED:  A 2-D echocardiogram on Jun 28, 2010.  The results were pending at the time of hospital discharge.  HISTORY OF PRESENTING ILLNESS:  The patient is a 77 year old woman with a past medical history significant for nonobstructive coronary artery disease, atrial tachycardia, hypertension, type 2 diabetes mellitus, and esophageal dysmotility.  She presented to the emergency department on Jun 27, 2010, with a chief complaint of chest pain and generalized weakness.  In the emergency department, the patient was noted to be hemodynamically stable and afebrile.  Her EKG revealed a heart rate of 71 beats per minute, artifact in lead V, and low voltage QRS.  Her chest x-ray revealed emphysematous and bronchitic changes, but no acute abnormalities.  Her initial cardiac markers were negative.  Her urinalysis revealed a trace of blood, 30 protein, positive nitrite, and small leukocytes.  Her micro urine revealed too numerous to count wbc's and many bacteria.  She was admitted for further evaluation and management.  HOSPITAL COURSE:  1. Chest pain.  The patient was restarted on aspirin, Plavix, and her     other cardiac medications.  Her pain was treated with as-needed     sublingual nitroglycerin and as-needed morphine.  Proton pump     inhibitor therapy was continued.  The dose, however, of Protonix     was increased to b.i.d.  For further evaluation, a number of     studies were ordered.  All of her cardiac enzymes were well within     normal limits, and therefore she ruled out for myocardial     infarction.  Her followup EKG revealed normal sinus rhythm with a     heart rate of 81 beats per minute and a few PVCs, but no ST or T-     wave abnormalities.  The patient was also noted to have low  voltage     QRS.  A fasting lipid profile was ordered.  However, the results     were pending at the time of discharge.  The 2-D echocardiogram was     ordered and performed as well, but the results were pending at the     time of hospital discharge.  The patient had no complaints of chest     pain during the hospitalization.  The etiology of her chest pain is     unknown.  It could have been secondary to gastroesophageal reflux     and/or the effects of her hiatal hernia.  Nevertheless, an     appointment was made for her to follow up with cardiologist, Dr.     Dietrich Pates for further outpatient evaluation. 2. Gastroesophageal reflux disease, hiatal hernia, and history of     esophageal dysmotility.  The dose of Protonix was increased to 40     mg b.i.d.  The patient had no complaints of dysphagia during the     hospital course.  She is followed chronically by Dr. Karilyn Cota.  She     was recently informed that it would be okay to restart Celebrex.     She had not started Celebrex yet.  I instructed her to take     Celebrex only as needed.  She was instructed to increase the     lansoprazole to b.i.d. rather than taking it once daily. 3. Hyperkalemia.  The patient's serum potassium was 5.1 on admission.     It increased to 5.5 the next day.  Lisinopril was discontinued.     She was given one small intravenous dose of Lasix and Kayexalate.     Her serum potassium improved to 4.7 prior to discharge.  She was     advised to discontinue lisinopril. 4. Urinary tract infection.  The patient had no outright complaints of     pain with urination, although she did notice more urinary     frequency.  Her urinalysis was suggestive of infection.  She was     treated with Cipro intravenously.  A urine culture     was ordered, but the results were pending at the time of hospital     discharge.  She was discharged to home on 5 more days of Cipro. 5. Hypothyroidism with iatrogenic hyperthyroidism.  The  patient's TSH     was low at 0.066 and her free T4 was mildly elevated at 1.81.     Levothyroxine was withheld during the hospital course.  The dose of     levothyroxine was decreased to 88 mcg daily.  The patient was     informed of the decrease in the dose.  She was advised to restart     the new dose tomorrow.  Further evaluation and management will be     deferred to Dr. Lysbeth Galas. 6. Mild anemia and thrombocytopenia.  The patient's hemoglobin was     11.7 on admission, and her platelet count was 163 on admission.     Following IV fluid hydration, her hemoglobin fell to 10.8 and her     platelet count fell to 143.  She was given DVT prophylaxis with     Lovenox.  Lovenox probably did not cause the decrease in her     platelet count.  The decrease may have been secondary to the     dilutional effect of the IV fluids.  Nevertheless, an anemia panel     was ordered.  The results were pending at the time of hospital     discharge.  The patient was advised to take a multivitamin with     iron once daily.     Elliot Cousin, M.D.     DF/MEDQ  D:  06/28/2010  T:  06/29/2010  Job:  [id number]  cc:   Delaney Meigs, M.D. Fax: [phone]  Lionel December, M.D. Fax: [phone]  Gerrit Friends. Dietrich Pates, MD, [facility] [address] [place], [place] [address] Electronically Signed by Elliot Cousin M.D. on 07/03/2010 05:37:39 PM

## 2010-07-18 ENCOUNTER — Inpatient Hospital Stay (HOSPITAL_COMMUNITY)
Admission: EM | Admit: 2010-07-18 | Discharge: 2010-07-21 | DRG: 312 | Disposition: A | Discharge status: Home Health | Payer: Medicare Other | Source: Ambulatory Visit | Attending: Internal Medicine | Admitting: Internal Medicine

## 2010-07-18 DIAGNOSIS — G20 Parkinson's disease: Secondary | ICD-10-CM | POA: Diagnosis present

## 2010-07-18 DIAGNOSIS — I2510 Atherosclerotic heart disease of native coronary artery without angina pectoris: Secondary | ICD-10-CM | POA: Diagnosis present

## 2010-07-18 DIAGNOSIS — G20A1 Parkinson's disease without dyskinesia, without mention of fluctuations: Secondary | ICD-10-CM | POA: Diagnosis present

## 2010-07-18 DIAGNOSIS — R5381 Other malaise: Secondary | ICD-10-CM | POA: Diagnosis present

## 2010-07-18 DIAGNOSIS — IMO0001 Reserved for inherently not codable concepts without codable children: Secondary | ICD-10-CM | POA: Diagnosis present

## 2010-07-18 DIAGNOSIS — A088 Other specified intestinal infections: Secondary | ICD-10-CM | POA: Diagnosis present

## 2010-07-18 DIAGNOSIS — E860 Dehydration: Secondary | ICD-10-CM | POA: Diagnosis present

## 2010-07-18 DIAGNOSIS — I951 Orthostatic hypotension: Principal | ICD-10-CM | POA: Diagnosis present

## 2010-07-18 DIAGNOSIS — E039 Hypothyroidism, unspecified: Secondary | ICD-10-CM | POA: Diagnosis present

## 2010-07-18 DIAGNOSIS — K219 Gastro-esophageal reflux disease without esophagitis: Secondary | ICD-10-CM | POA: Diagnosis present

## 2010-07-18 LAB — DIFFERENTIAL
Basophils Absolute: 0 10*3/uL (ref 0.0–0.1)
Eosinophils Absolute: 0.5 10*3/uL (ref 0.0–0.7)
Lymphocytes Relative: 16 % (ref 12–46)
Lymphs Abs: 0.9 10*3/uL (ref 0.7–4.0)
Neutrophils Relative %: 65 % (ref 43–77)

## 2010-07-18 LAB — URINALYSIS, ROUTINE W REFLEX MICROSCOPIC
Glucose, UA: NEGATIVE mg/dL
Leukocytes, UA: NEGATIVE
Protein, ur: 100 mg/dL — AB
Specific Gravity, Urine: >1.03 — ABNORMAL HIGH (ref 1.005–1.030)
pH: 5.5 (ref 5.0–8.0)

## 2010-07-18 LAB — COMPREHENSIVE METABOLIC PANEL
Albumin: 3.6 g/dL (ref 3.5–5.2)
Alkaline Phosphatase: 109 U/L (ref 39–117)
BUN: 21 mg/dL (ref 6–23)
Calcium: 10.2 mg/dL (ref 8.4–10.5)
Potassium: 4.4 mEq/L (ref 3.5–5.1)
Total Protein: 7.1 g/dL (ref 6.0–8.3)

## 2010-07-18 LAB — URINE MICROSCOPIC-ADD ON

## 2010-07-18 LAB — CK TOTAL AND CKMB (NOT AT ARMC)
CK, MB: 2.6 ng/mL (ref 0.3–4.0)
Total CK: 52 U/L (ref 7–177)

## 2010-07-18 LAB — CBC
HCT: 36.6 % (ref 36.0–46.0)
MCV: 88.6 fL (ref 78.0–100.0)
Platelets: 146 10*3/uL — ABNORMAL LOW (ref 150–400)
RBC: 4.13 MIL/uL (ref 3.87–5.11)
WBC: 5.4 10*3/uL (ref 4.0–10.5)

## 2010-07-18 LAB — TROPONIN I: Troponin I: <0.3 ng/mL (ref <0.30)

## 2010-07-19 DIAGNOSIS — K219 Gastro-esophageal reflux disease without esophagitis: Secondary | ICD-10-CM

## 2010-07-19 DIAGNOSIS — D649 Anemia, unspecified: Secondary | ICD-10-CM

## 2010-07-19 DIAGNOSIS — G20 Parkinson's disease: Secondary | ICD-10-CM

## 2010-07-19 DIAGNOSIS — E119 Type 2 diabetes mellitus without complications: Secondary | ICD-10-CM | POA: Insufficient documentation

## 2010-07-19 DIAGNOSIS — R079 Chest pain, unspecified: Secondary | ICD-10-CM | POA: Insufficient documentation

## 2010-07-19 DIAGNOSIS — E0590 Thyrotoxicosis, unspecified without thyrotoxic crisis or storm: Secondary | ICD-10-CM | POA: Insufficient documentation

## 2010-07-19 DIAGNOSIS — I10 Essential (primary) hypertension: Secondary | ICD-10-CM

## 2010-07-19 DIAGNOSIS — D696 Thrombocytopenia, unspecified: Secondary | ICD-10-CM

## 2010-07-19 DIAGNOSIS — N390 Urinary tract infection, site not specified: Secondary | ICD-10-CM

## 2010-07-19 DIAGNOSIS — G20A1 Parkinson's disease without dyskinesia, without mention of fluctuations: Secondary | ICD-10-CM

## 2010-07-19 LAB — CBC
MCV: 89.2 fL (ref 78.0–100.0)
Platelets: 121 10*3/uL — ABNORMAL LOW (ref 150–400)
RBC: 3.69 MIL/uL — ABNORMAL LOW (ref 3.87–5.11)
RDW: 14.7 % (ref 11.5–15.5)
WBC: 3.9 10*3/uL — ABNORMAL LOW (ref 4.0–10.5)

## 2010-07-19 LAB — T4, FREE: Free T4: 1.43 ng/dL (ref 0.80–1.80)

## 2010-07-19 LAB — GLUCOSE, CAPILLARY
Glucose-Capillary: 76 mg/dL (ref 70–99)
Glucose-Capillary: 93 mg/dL (ref 70–99)

## 2010-07-19 LAB — CLOSTRIDIUM DIFFICILE BY PCR: Toxigenic C. Difficile by PCR: NEGATIVE

## 2010-07-19 LAB — DIFFERENTIAL
Basophils Relative: 1 % (ref 0–1)
Eosinophils Absolute: 0.5 10*3/uL (ref 0.0–0.7)
Eosinophils Relative: 14 % — ABNORMAL HIGH (ref 0–5)
Neutrophils Relative %: 46 % (ref 43–77)

## 2010-07-19 LAB — TSH: TSH: 0.114 u[IU]/mL — ABNORMAL LOW (ref 0.350–4.500)

## 2010-07-19 NOTE — H&P (Signed)
Jennifer [name], Jennifer [name] NO.:  [id number]  MEDICAL RECORD NO.:  [id number]  LOCATION:  A309                          FACILITY:  [facility]  PHYSICIAN:  Vania Rea, M.D. DATE OF BIRTH:  December 12, 1930  DATE OF ADMISSION:  07/18/2010 DATE OF DISCHARGE:  LH                             HISTORY & PHYSICAL   PRIMARY CARE PHYSICIAN:  Delaney Meigs, MD  NEUROLOGIST:  Dr. Izola Price at [facility].  CHIEF COMPLAINT:  Low blood pressure.  HISTORY OF PRESENT ILLNESS:  This is a 77 year old Caucasian lady who reports that she has been feeling very weak for the past couple of days, fell twice, and eventually went to her primary care physician for evaluation today.  When seen by primary care physician, the blood pressure was noted to be very low and he sent her to the emergency room for further evaluation.  She denies any chest pain, nausea, and vomiting.  She does feel that her husband apparently at one point felt her heart was racing, for which reason he took her to Dr. Joyce Copa office.  She does have a past history of SVT but has not herself noted any chest pain, tightness.  No shortness of breath.  She has had two watery loose stools since coming to the emergency room.  Since arrival in the emergency room, the patient's blood pressure which was initially in the 70s has come up to normal after repeated boluses of IV fluids.  However, she is still not quite herself and the hospitalist service has been called to assist with management.  PAST MEDICAL HISTORY: 1. Coronary artery disease. 2. Diabetes. 3. Hypertension. 4. Hypothyroidism with history of iatrogenic hyperthyroidism due to     excess Synthroid. 5. Parkinson disease. 6. History of kidney stones. 7. Past history of nonsustained V-tach. 8. History of GERD. 9. History of nonobstructive coronary disease.  PAST SURGICAL HISTORY: 1. History of decompressive lumbar laminotomy in June 2007. 2. Status post  radiofrequency ablation of atrial tachycardia in     November 2006 by Dr. Sharrell Ku.  MEDICATIONS: 1. Crestor 10 mg twice daily. 2. Glyburide/metformin 2.5/500 two tablets twice daily. 3. Plavix daily. 4. Synthroid 50 mcg daily. 5. Metoprolol 25 mg daily. 6. Aspirin 325 mg daily. 7. Sanctura 60 mg daily. 8. Furosemide 20 mg daily. 9. Nexium 40 mg daily. 10.Lisinopril 5 mg daily. 11.Lyrica 75 mg twice daily. 12.Azilect 1 mg daily. 13.Pramipexole 0.125 mg three times daily.  ALLERGIES:  CODEINE, PENICILLIN, REQUIP.  SOCIAL HISTORY:  She has a 50-pack-year tobacco history but currently does not smoke, drink, or use alcohol.  Lives with her husband.  She is a retired Engineer, petroleum.  FAMILY HISTORY:  Significant for coronary artery disease in both parents and also a sibling with heart disease.  Significant only for headache which she says has developed since coming to the emergency room.  She has not eaten for over 12 hours and watery loose stools which have developed since being in the emergency room.  PHYSICAL EXAMINATION:  GENERAL:  A pleasant, elderly Caucasian lady, reclining in the stretcher. VITAL SIGNS:  Her temperature is 98.2.  Her pulse is 97, respirations 18,  blood pressure 115/64.  She is saturating 100% on 2 liters. HEENT:  Her pupils are round and equal.  Mucous membranes pink. Anicteric. NECK:  No cervical lymphadenopathy.  No thyromegaly or carotid bruit. CHEST:  Clear to auscultation bilaterally. CARDIOVASCULAR:  Regular rhythm.  No murmur. ABDOMEN:  Obese, soft, nontender. EXTREMITIES:  Without edema.  Dorsalis pedis pulses 2+ bilaterally. SKIN: Warm, no erythema, no ulcerations. CENTRAL NERVOUS SYSTEM:  Cranial nerves II through XII grossly intact. She has a coarse resting tremor of the left upper extremity, less so of the right upper extremity.  LABS: WBC 5.4, Hb 11.7, Plats 146, normal differential.  Na 137, K 4.4, CO2 26, Glu 215, BUN 21, Creat  1.21, Ca 10.2.  CK 52, CK-MB 2.3, Trop I <0.30.  Urine SG: >1.030, proteins 100.  EKG: Sinus tach.   ASSESSMENT 1.  Hypotension - improved with IV fluids 2. Dehydration 3. DM2, uncnotrolled 4. Parkinsons Disease 5. New onset Diarrhoea 6. Hypothyroidism, 7. CAD 8. Chronic Back Pain  PLAN: Admit for continued hydration and telemetry monitoring; She has a H/O SVT and there are some irregulart tele tracings which are likely due to artifact becasue of her parkinsons. since she ahs had recnet antibiotics and hospitalization will chek C. diff  PCR., Cardiac enzymes, Thyroid function. We note her Lisionpril and lasix were discontinued at previous discharge. Recent ECHO shows neither systolic nor diastolic dysfunction. Other plans as per orders.      Vania Rea, M.D.     LC/MEDQ  D:  07/19/2010  T:  07/19/2010  Job:  [id number]  Electronically Signed by Vania Rea M.D. on 07/19/2010 07:05:56 AM

## 2010-07-20 LAB — FECAL LACTOFERRIN, QUANT

## 2010-07-20 LAB — GLUCOSE, CAPILLARY
Glucose-Capillary: 121 mg/dL — ABNORMAL HIGH (ref 70–99)
Glucose-Capillary: 132 mg/dL — ABNORMAL HIGH (ref 70–99)
Glucose-Capillary: 110 mg/dL — ABNORMAL HIGH (ref 70–99)

## 2010-07-20 LAB — BASIC METABOLIC PANEL
Calcium: 9.4 mg/dL (ref 8.4–10.5)
GFR calc Af Amer: >60 mL/min (ref >60)
GFR calc non Af Amer: 60 mL/min — ABNORMAL LOW (ref >60)
Glucose, Bld: 157 mg/dL — ABNORMAL HIGH (ref 70–99)
Potassium: 4.1 mEq/L (ref 3.5–5.1)
Sodium: 140 mEq/L (ref 135–145)

## 2010-07-20 LAB — HEMOGLOBIN A1C: Mean Plasma Glucose: 137 mg/dL — ABNORMAL HIGH (ref <117)

## 2010-07-21 ENCOUNTER — Encounter: Payer: Medicare Other | Admitting: Cardiology

## 2010-07-21 LAB — URINE CULTURE
Colony Count: >=100000
Culture  Setup Time: 201206062122

## 2010-07-21 LAB — GLUCOSE, CAPILLARY
Glucose-Capillary: 116 mg/dL — ABNORMAL HIGH (ref 70–99)
Glucose-Capillary: 139 mg/dL — ABNORMAL HIGH (ref 70–99)

## 2010-07-31 NOTE — Discharge Summary (Signed)
NAMEWILLARD, [name]                ACCOUNT NO.:  [id number]  MEDICAL RECORD NO.:  [id number]  LOCATION:  A309                          FACILITY:  [facility]  PHYSICIAN:  Jennifer [name], MDDATE OF BIRTH:  05/03/1930  DATE OF ADMISSION:  07/18/2010 DATE OF DISCHARGE:  06/08/2012LH                              DISCHARGE SUMMARY   DISCHARGE DIAGNOSES: 1. Hypotension. 2. Continued orthostatic hypotension. 3. Viral gastroenteritis. 4. Hypothyroidism. 5. Coronary artery disease. 6. Type 2 diabetes. 7. History of hypertension. 8. Parkinson disease. 9. History of nonsustained ventricular tachycardia. 10.Gastroesophageal reflux disease. 11.Reported unknown bladder dysfunction. 12.Chronic debility. 13.Chronic back pain.  DISCHARGE MEDICATIONS:  Stop metoprolol for a week until followup with Dr. Lysbeth [name].  This can be resumed if she becomes significantly hypertensive, and her orthostatic hypotension and symptoms improve. Stop Sanctura.  Continue: 1. Rasagiline 1 mg a day. 2. Synthroid 88 mcg a day. 3. Crestor 10 mg nightly. 4. Plavix 75 mg nightly. 5. Pramipexole 0.125 mg p.o. t.i.d. 6. Lansoprazole 30 mg a day. 7. Lyrica 75 mg p.o. b.i.d. 8. Aspirin 325 mg a day. 9. Fluticasone disk 50 mcg b.i.d. p.r.n. nasal congestion. 10.Glyburide/metformin 2.5/500 mg p.o. b.i.d. 11.Celebrex 200 mg a day.  CONDITION:  Stable.  ACTIVITY:  Continue her walker.  She should observe orthostatic hypotension precautions.  Home physical therapy is being arranged.  She should wear her compression stockings during the day.  Follow up with Dr. Dietrich [name] as previously scheduled.  Follow up with Dr. Lysbeth [name] next week to check orthostatic vital signs, and resume metoprolol as Sanctura as needed.  CONDITION:  Stable.  DIET:  Should be diabetic.  CONSULTATIONS:  None.  PROCEDURES:  None.  LABORATORY DATA:  C. diff PCR is negative.  On admission, CBC significant for hemoglobin of 11.7, platelet count  was 146.  CBC, otherwise unremarkable.  Complete metabolic panel on admission significant for a glucose of 215, creatinine 1.21, BUN was 21.  On July 20, 2010, her BUN was 14 and her creatinine was 0.91, hemoglobin A1c was 6.4.  Cardiac enzymes negative.  Free T4 is normal.  TSH is 0.114. Urinalysis shows specific gravity of greater than 1.030, 100 protein, many bacteria, but negative nitrite, negative leukocyte esterase, 0-2 white cells, and 0-2 red cells.  Urine culture grew out greater than 100,000 colonies of enterococcus viridans. Fecal lactoferrin was negative.  DIAGNOSTICS:  EKG showed sinus tachycardia.  HISTORY AND HOSPITAL COURSE:  Please see H and P for complete admission details.  Jennifer [name] is a pleasant 77 year old white female with multiple medical problems who had been feeling very weak.  She fell several times.  Her symptoms were worse with standing.  Her blood pressure reportedly was low in Jennifer [name].  When she arrived in the emergency room, she had a blood pressure of the 70s, which responded well to IV fluids.  She developed watery diarrhea and probably had a viral gastroenteritis and was dehydrated.  She had no fevers.  She was given IV fluids.  C. diff was ruled out.  Her antihypertensives were held.  By the time of discharge, her diarrhea had improved and nearly resolved.  Her appetite has been fine,  and she feels much better, but she continues to have orthostatic change in blood pressure.  This morning, her supine blood pressure was 145/82, and her standing blood pressure was 94/59.  Her heart rate increased by 10 as well.  However, she is requesting discharge for the past 3 days.  She has been able to ambulate.  We will put compression stockings on her, and stop her metoprolol completely for now.  I will also recommend that she stop her Sanctura in case that is contributing.  She will be seen by a home health nurse to monitor cardiopulmonary status and  orthostatics.  She may need her metoprolol started back, but for now this will be held. She will need close follow up with her primary care physician.  If this continues to be a problem, she may need pharmacologic treatment of her orthostasis as well.  She is quite debilitated and deconditioned and hopefully, Physical Therapy will help with the orthostatics as well.  It is most likely multifactorial, diabetes, medications, age, Parkinson, deconditioning.  Her other medical problems remained stable during the hospitalization.  Total time on the day of discharge was greater than 30 minutes.     Jennifer Monterosso L. Lendell Caprice, MD     CLS/MEDQ  D:  07/21/2010  T:  07/22/2010  Job:  [id number]  cc:   Jennifer [name], M.D. Fax: [phone]  Jennifer Friends. Jennifer Pates, MD, [facility] [address] [place], [place] [address]  Electronically Signed by Crista Curb MD on 07/31/2010 10:34:30 AM

## 2010-08-04 ENCOUNTER — Encounter: Payer: Self-pay | Admitting: Adult Health

## 2010-08-04 ENCOUNTER — Ambulatory Visit (INDEPENDENT_AMBULATORY_CARE_PROVIDER_SITE_OTHER): Payer: Medicare Other | Admitting: Adult Health

## 2010-08-04 DIAGNOSIS — R079 Chest pain, unspecified: Secondary | ICD-10-CM

## 2010-08-04 DIAGNOSIS — G238 Other specified degenerative diseases of basal ganglia: Secondary | ICD-10-CM

## 2010-08-04 MED ORDER — METOPROLOL SUCCINATE 12.5 MG HALF TABLET: 12.5000 mg | ORAL_TABLET | Freq: Every day | ORAL | Status: DC

## 2010-08-04 NOTE — Patient Instructions (Signed)
Your physician has recommended you make the following change in your medication: start taking Metoprolol 12.5 mg (1/2 of a 25mg  tablet) daily  Your physician recommends that you schedule a follow-up appointment in: 1 month

## 2010-08-04 NOTE — Progress Notes (Signed)
HPI: [name] is  76 y/o patient of Dr. Dietrich [name] we are following for assessment and treatment of CAD, NSVT, who was recently discharged from [facility] with diagnosis of orthostatic hypotension, in the setting of viral gastritis. She was treated with abx and symptoms improved along with BP. Metoprolol was discontinued. She was fitted for TED hose.  Since being home she is feeling a little better but continues to have dizziness when standing up. She is wearing the TED hose daily. She is drinking water daily.  She feels her heart rate up. Her husband who is with her is very attentive and keeps a close eye on her. She has had no falls or near syncope.  Allergies  Allergen Reactions  . Codeine   . Penicillins   . Requip     Current Outpatient Prescriptions  Medication Sig Dispense Refill  . aspirin 325 MG tablet Take 325 mg by mouth daily.        . celecoxib (CELEBREX) 200 MG capsule Take 200 mg by mouth 2 (two) times daily.        . clopidogrel (PLAVIX) 75 MG tablet Take 75 mg by mouth daily.        . fluticasone (FLONASE) 50 MCG/ACT nasal spray Place 2 sprays into the nose daily.        Marland Kitchen glyBURIDE-metformin (GLUCOVANCE) 2.5-500 MG per tablet Take 1 tablet by mouth daily with breakfast.        . lansoprazole (PREVACID) 30 MG capsule Take 30 mg by mouth daily.        Marland Kitchen levothyroxine (SYNTHROID, LEVOTHROID) 88 MCG tablet Take 88 mcg by mouth daily.        . metFORMIN (GLUCOPHAGE-XR) 500 MG 24 hr tablet Take 500 mg by mouth daily with breakfast.        . pramipexole (MIRAPEX) 0.125 MG tablet Take 0.125 mg by mouth 4 (four) times daily.       . pregabalin (LYRICA) 75 MG capsule Take 75 mg by mouth 2 (two) times daily.        . Rasagiline Mesylate (AZILECT) 1 MG TABS Take 1 tablet by mouth daily.        . rosuvastatin (CRESTOR) 10 MG tablet Take 10 mg by mouth daily.        . metoprolol succinate (TOPROL XL) 12.5 mg TB24 Take 0.5 tablets (12.5 mg total) by mouth daily.  30 tablet  3  . DISCONTD:  ciprofloxacin (CIPRO) 250 MG tablet Take 250 mg by mouth 2 (two) times daily.        Marland Kitchen DISCONTD: metoprolol (TOPROL-XL) 50 MG 24 hr tablet Take 50 mg by mouth daily.        Marland Kitchen DISCONTD: Multiple Vitamins-Minerals (MULTIVITAMIN WITH MINERALS) tablet Take 1 tablet by mouth daily.        Marland Kitchen DISCONTD: Trospium Chloride (SANCTURA XR) 60 MG CP24 Take 1 capsule by mouth daily.          Past Medical History  Diagnosis Date  . Hypertension   . Chest pain   . GERD (gastroesophageal reflux disease)   . Diabetes mellitus   . UTI (lower urinary tract infection)   . Hyperthyroidism   . Anemia   . Thrombocytopenia   . Hypertension   . Parkinson disease     Past Surgical History  Procedure Date  . Kidney stone surgery   . Lumbar laminectomy 2007    decompression  . Radiofrequency ablation 2006    atrial tachycardia Dr.Gregg Ladona [name]  YQI:HKVQQV of systems complete and found to be negative unless listed above PHYSICAL EXAM BP 128/80  Pulse 116  Ht 5\' 3"  (1.6 m)  Wt 163 lb (73.936 kg)  BMI 28.87 kg/m2  SpO2 91% General: Well developed, well nourished, frail,  in no acute distress Head: Eyes PERRLA, No xanthomas.   Normal cephalic and atramatic  Lungs: Clear bilaterally to auscultation and percussion. Heart: HRRR S1 S2, tachycardic.  Pulses are 2+ & equal.            No carotid bruit. No JVD.  No abdominal bruits. No femoral bruits. Abdomen: Bowel sounds are positive, abdomen soft and non-tender without masses or                  Hernia's noted. Msk:   Kyphosis,  Slow l gait. Diminshed strength and tone for age. Tremors are noted. Extremities: No clubbing, cyanosis or edema.  DP +1 Neuro: Alert and oriented X 3. Psych:  Good affect, responds appropriately  Orthostatics: Lying: 149/91 HR 147; Sitting 154/96 HR 117; Standing 128/80 HR 116.:  ASSESSMENT AND PLAN

## 2010-08-04 NOTE — Assessment & Plan Note (Signed)
Review of orthostatic BP taken in clinic today reveal that she continues to have this.  Almost 20 mmHg drop from lying to sitting.  HR remains elevated. She is to continue TED hose, water and get up slowly from seated or lying position.  Secondary to tachycardia and CAD, will restart metoprolol at 12.5mg  daily.  She had been on 50 mg daily, although H&P states it was 25 mg daily.  She insists it was 50 mg. We will start slowly and recheck her in a couple of weeks.

## 2010-08-04 NOTE — Assessment & Plan Note (Signed)
No recurrence with know history of CAD. Continue risk factor management.

## 2010-08-20 ENCOUNTER — Encounter (HOSPITAL_COMMUNITY): Payer: Self-pay

## 2010-08-20 ENCOUNTER — Emergency Department (HOSPITAL_COMMUNITY)
Admission: EM | Admit: 2010-08-20 | Discharge: 2010-08-20 | Disposition: A | Discharge status: Home / Self Care | Payer: Medicare Other | Attending: Emergency Medicine | Admitting: Emergency Medicine

## 2010-08-20 DIAGNOSIS — G20A1 Parkinson's disease without dyskinesia, without mention of fluctuations: Secondary | ICD-10-CM | POA: Insufficient documentation

## 2010-08-20 DIAGNOSIS — E119 Type 2 diabetes mellitus without complications: Secondary | ICD-10-CM | POA: Insufficient documentation

## 2010-08-20 DIAGNOSIS — I10 Essential (primary) hypertension: Secondary | ICD-10-CM | POA: Insufficient documentation

## 2010-08-20 DIAGNOSIS — Z87891 Personal history of nicotine dependence: Secondary | ICD-10-CM | POA: Insufficient documentation

## 2010-08-20 DIAGNOSIS — R197 Diarrhea, unspecified: Secondary | ICD-10-CM | POA: Insufficient documentation

## 2010-08-20 DIAGNOSIS — Z862 Personal history of diseases of the blood and blood-forming organs and certain disorders involving the immune mechanism: Secondary | ICD-10-CM | POA: Insufficient documentation

## 2010-08-20 DIAGNOSIS — G20 Parkinson's disease: Secondary | ICD-10-CM | POA: Insufficient documentation

## 2010-08-20 LAB — DIFFERENTIAL
Basophils Absolute: 0 10*3/uL (ref 0.0–0.1)
Lymphocytes Relative: 23 % (ref 12–46)
Lymphs Abs: 1.2 10*3/uL (ref 0.7–4.0)
Monocytes Absolute: 0.4 10*3/uL (ref 0.1–1.0)
Neutro Abs: 3.4 10*3/uL (ref 1.7–7.7)

## 2010-08-20 LAB — CBC
HCT: 34.6 % — ABNORMAL LOW (ref 36.0–46.0)
MCV: 89.2 fL (ref 78.0–100.0)
RBC: 3.88 MIL/uL (ref 3.87–5.11)
RDW: 14.9 % (ref 11.5–15.5)
WBC: 5.2 10*3/uL (ref 4.0–10.5)

## 2010-08-20 LAB — BASIC METABOLIC PANEL
CO2: 28 mEq/L (ref 19–32)
Chloride: 108 mEq/L (ref 96–112)
Glucose, Bld: 120 mg/dL — ABNORMAL HIGH (ref 70–99)
Sodium: 141 mEq/L (ref 135–145)

## 2010-08-20 MED ORDER — SODIUM CHLORIDE 0.9 % IV SOLN
Freq: Once | INTRAVENOUS | Status: AC
  Administered 2010-08-20 (×2): via INTRAVENOUS

## 2010-08-20 MED ORDER — LOPERAMIDE HCL 2 MG PO CAPS
4.0000 mg | ORAL_CAPSULE | ORAL | Status: DC | PRN
  Administered 2010-08-20: 4 mg via ORAL
  Filled 2010-08-20: qty 2

## 2010-08-20 MED ORDER — METRONIDAZOLE 500 MG PO TABS: 500.0000 mg | ORAL_TABLET | Freq: Three times a day (TID) | ORAL | Status: AC

## 2010-08-20 NOTE — ED Provider Notes (Signed)
History     Chief Complaint  Patient presents with  . Diarrhea   Patient is a 77 y.o. female presenting with diarrhea. The history is provided by the patient and a relative.  Diarrhea The primary symptoms include abdominal pain and diarrhea. Primary symptoms do not include fever, weight loss, nausea or vomiting. Episode onset: Symptoms started three weeks ago and have been  constant. She had been hospitalized one month ago. The onset was sudden. The problem has not changed (She ahs taken Pepto Bismol with no reliefl Nothing makes it better, nothing makes it worse. Symptoms are severe.) since onset. The illness does not include anorexia.    Past Medical History  Diagnosis Date  . Hypertension   . Chest pain   . GERD (gastroesophageal reflux disease)   . Diabetes mellitus   . UTI (lower urinary tract infection)   . Hyperthyroidism   . Anemia   . Thrombocytopenia   . Hypertension   . Parkinson disease     Past Surgical History  Procedure Date  . Kidney stone surgery   . Lumbar laminectomy 2007    decompression  . Radiofrequency ablation 2006    atrial tachycardia Dr.Gregg Ladona Ridgel    History reviewed. No pertinent family history.  History  Substance Use Topics  . Smoking status: Former Smoker -- 50 years    Types: Cigarettes  . Smokeless tobacco: Never Used  . Alcohol Use: No    OB History    Grav Para Term Preterm Abortions TAB SAB Ect Mult Living                  Review of Systems  Constitutional: Negative for fever and weight loss.  Gastrointestinal: Positive for abdominal pain and diarrhea. Negative for nausea, vomiting and anorexia.  All other systems reviewed and are negative.    Physical Exam  BP 182/82  Pulse 82  Temp(Src) 97.4 F (36.3 C) (Oral)  Resp 20  Ht 5\' 4"  (1.626 m)  Wt 162 lb (73.483 kg)  BMI 27.81 kg/m2  SpO2 94%  Physical Exam  Nursing note and vitals reviewed. Constitutional: She is oriented to person, place, and time. She  appears well-developed and well-nourished.  HENT:  Head: Normocephalic and atraumatic.  Right Ear: External ear normal.  Left Ear: External ear normal.  Nose: Nose normal.  Mouth/Throat: Oropharynx is clear and moist.  Eyes: Conjunctivae and EOM are normal. Pupils are equal, round, and reactive to light.  Neck: Normal range of motion. Neck supple. No JVD present.  Cardiovascular: Normal rate, regular rhythm and normal heart sounds.   Pulmonary/Chest: Effort normal and breath sounds normal. She has no wheezes. She has no rales.  Abdominal: Soft. Bowel sounds are normal. She exhibits no mass. There is no tenderness.  Genitourinary:       Decreased sphincter tone. Stool is gay and Hemoccult negative.  Musculoskeletal: Normal range of motion. She exhibits no edema.  Lymphadenopathy:    She has no cervical adenopathy.  Neurological: She is alert and oriented to person, place, and time. No cranial nerve deficit.  Skin: Skin is warm and dry. No rash noted.  Psychiatric: She has a normal mood and affect.    ED Course  Procedures No orthostatic vital sign changes MDM Possible C. Difficile colitis - will treat empirically with Flagyl.      Dione Booze, MD 08/20/10 1326

## 2010-08-20 NOTE — ED Notes (Signed)
Pt to be discharged home. Family has stepped out to have lunch.

## 2010-08-20 NOTE — ED Notes (Signed)
Pt states she has had diarrhea x 3 weeks. States she feels all washed out

## 2010-08-20 NOTE — ED Notes (Signed)
Pt ambulated to bathroom with assist x1 and back to bed. Pt in nad and EDP at bedside.

## 2010-09-08 ENCOUNTER — Encounter: Payer: Self-pay | Admitting: Adult Health

## 2010-09-08 ENCOUNTER — Ambulatory Visit (INDEPENDENT_AMBULATORY_CARE_PROVIDER_SITE_OTHER): Payer: Medicare Other | Admitting: Adult Health

## 2010-09-08 DIAGNOSIS — E7800 Pure hypercholesterolemia, unspecified: Secondary | ICD-10-CM

## 2010-09-08 DIAGNOSIS — I10 Essential (primary) hypertension: Secondary | ICD-10-CM

## 2010-09-08 DIAGNOSIS — I2510 Atherosclerotic heart disease of native coronary artery without angina pectoris: Secondary | ICD-10-CM

## 2010-09-08 MED ORDER — ROSUVASTATIN CALCIUM 10 MG PO TABS: 10.0000 mg | ORAL_TABLET | Freq: Every day | ORAL | Status: DC

## 2010-09-08 NOTE — Progress Notes (Signed)
HPI  Allergies  Allergen Reactions  . Codeine   . Penicillins   . Requip     Current Outpatient Prescriptions  Medication Sig Dispense Refill  . aspirin 325 MG tablet Take 325 mg by mouth daily.        . celecoxib (CELEBREX) 200 MG capsule Take 200 mg by mouth 2 (two) times daily.        . clopidogrel (PLAVIX) 75 MG tablet Take 75 mg by mouth daily.        . fluconazole (DIFLUCAN) 150 MG tablet Take 150 mg by mouth as directed.        . fluticasone (FLONASE) 50 MCG/ACT nasal spray Place 2 sprays into the nose daily.        . furosemide (LASIX) 20 MG tablet Take 20 mg by mouth daily as needed.       . lansoprazole (PREVACID) 30 MG capsule Take 30 mg by mouth daily.        Marland Kitchen levothyroxine (SYNTHROID, LEVOTHROID) 75 MCG tablet Take 75 mcg by mouth daily.        . metoprolol succinate (TOPROL XL) 12.5 mg TB24 Take 0.5 tablets (12.5 mg total) by mouth daily.  30 tablet  3  . phenazopyridine (PYRIDIUM) 100 MG tablet Take 100 mg by mouth 3 (three) times daily as needed.        . pramipexole (MIRAPEX) 0.125 MG tablet Take 0.125 mg by mouth 3 (three) times daily.       . pregabalin (LYRICA) 75 MG capsule Take 75 mg by mouth 2 (two) times daily.        . Rasagiline Mesylate (AZILECT) 1 MG TABS Take 1 tablet by mouth daily.        . rosuvastatin (CRESTOR) 10 MG tablet Take 1 tablet (10 mg total) by mouth daily.  28 tablet  0    Past Medical History  Diagnosis Date  . Hypertension   . Chest pain   . GERD (gastroesophageal reflux disease)   . Diabetes mellitus   . UTI (lower urinary tract infection)   . Hyperthyroidism   . Anemia   . Thrombocytopenia   . Hypertension   . Parkinson disease     Past Surgical History  Procedure Date  . Kidney stone surgery   . Lumbar laminectomy 2007    decompression  . Radiofrequency ablation 2006    atrial tachycardia Dr.Gregg Ladona Ridgel    ROS: PHYSICAL EXAM BP 131/68  Pulse 114  Ht 5\' 2"  (1.575 m)  Wt 167 lb (75.751 kg)  BMI 30.54 kg/m2  SpO2  95%  EKG:  ASSESSMENT AND PLAN HPI: [name] is  77 y/o patient of Dr. Dietrich Pates we are following for assessment and treatment of CAD, NSVT, who was admitted to [facility] with diagnosis of orthostatic hypotension, in the setting of viral gastritis in June 2012. She was treated with abx and symptoms improved along with BP. Metoprolol was discontinued. She was fitted for TED hose.She does not wear them consistently, because during the summer months they make her feel too hot.  On last visit she was started on low dose medtoprolol succinate, 12.5mg  daily for tachycardia.  She was also restarted on low dose lasix 20mg  by Dr. Lysbeth Galas since last visit because of persistent LEE.Marland Kitchen  She was seen in the ER last week for diarrhea and was place on flagyl.  She has completed the course of antibiotics.  She is without cardiac complaint today with the exception of generalized fatigue.  She states that the Parkinsons is progressing and she is having less energy and unable to do things as she had in the past.  She denies chest pain or shortness of breath.  Tremor become worse on and off during the day. She has her usual aches and pains from arthritis.  Allergies  Allergen Reactions  . Codeine   . Penicillins   . Requip     Current Outpatient Prescriptions  Medication Sig Dispense Refill  . aspirin 325 MG tablet Take 325 mg by mouth daily.        . celecoxib (CELEBREX) 200 MG capsule Take 200 mg by mouth 2 (two) times daily.        . clopidogrel (PLAVIX) 75 MG tablet Take 75 mg by mouth daily.        . fluconazole (DIFLUCAN) 150 MG tablet Take 150 mg by mouth as directed.        . fluticasone (FLONASE) 50 MCG/ACT nasal spray Place 2 sprays into the nose daily.        . furosemide (LASIX) 20 MG tablet Take 20 mg by mouth daily as needed.       . lansoprazole (PREVACID) 30 MG capsule Take 30 mg by mouth daily.        Marland Kitchen levothyroxine (SYNTHROID, LEVOTHROID) 75 MCG tablet Take 75 mcg by mouth daily.        .  metoprolol succinate (TOPROL XL) 12.5 mg TB24 Take 0.5 tablets (12.5 mg total) by mouth daily.  30 tablet  3  . phenazopyridine (PYRIDIUM) 100 MG tablet Take 100 mg by mouth 3 (three) times daily as needed.        . pramipexole (MIRAPEX) 0.125 MG tablet Take 0.125 mg by mouth 3 (three) times daily.       . pregabalin (LYRICA) 75 MG capsule Take 75 mg by mouth 2 (two) times daily.        . Rasagiline Mesylate (AZILECT) 1 MG TABS Take 1 tablet by mouth daily.        . rosuvastatin (CRESTOR) 10 MG tablet Take 1 tablet (10 mg total) by mouth daily.  28 tablet  0    Past Medical History  Diagnosis Date  . Hypertension   . Chest pain   . GERD (gastroesophageal reflux disease)   . Diabetes mellitus   . UTI (lower urinary tract infection)   . Hyperthyroidism   . Anemia   . Thrombocytopenia   . Hypertension   . Parkinson disease     Past Surgical History  Procedure Date  . Kidney stone surgery   . Lumbar laminectomy 2007    decompression  . Radiofrequency ablation 2006    atrial tachycardia Dr.Gregg Ladona Ridgel    QMV:HQIONG of systems complete and found to be negative unless listed above PHYSICAL EXAM BP 131/68  Pulse 114  Ht 5\' 2"  (1.575 m)  Wt 167 lb (75.751 kg)  BMI 30.54 kg/m2  SpO2 95% General: Well developed, well nourished, frail,  in no acute distress Head: Eyes PERRLA, No xanthomas.   Normal cephalic and atramatic  Lungs: Clear bilaterally to auscultation and percussion. Heart: HRRR S1 S2, tachycardic.  Pulses are 2+ & equal.            No carotid bruit. No JVD.  No abdominal bruits. No femoral bruits. Abdomen: Bowel sounds are positive, abdomen soft and non-tender without masses or  Hernia's noted. Msk:   Kyphosis,  Slow l gait. Diminshed strength and tone for age. Tremors are noted. Extremities: No clubbing, cyanosis or edema.  DP +1 Neuro: Alert and oriented X 3. Psych:  Good affect, responds appropriately  Orthostatics: Lying: 149/91 HR 147; Sitting  154/96 HR 117; Standing 128/80 HR 116.:  ASSESSMENT AND PLAN

## 2010-09-08 NOTE — Assessment & Plan Note (Signed)
She is asymptomatic at present. She will continue plavix and ASA.  Will follow in 6 months.

## 2010-09-08 NOTE — Patient Instructions (Addendum)
You will follow up with Jennifer [name] in 6 months. Continue your current medications,except you may now take your lasix(furosemide), as needed. You have been given crestor 10mg  samples.

## 2010-09-08 NOTE — Assessment & Plan Note (Signed)
Blood pressure is well controlled. I have rechecked the apical pulse and it is 82 bpm.  The tremors from Parkensons my have given a falsely high HR reading.  We will continue the metoprolol at current dose.

## 2011-03-08 ENCOUNTER — Encounter: Payer: Self-pay | Admitting: Cardiology

## 2011-03-08 ENCOUNTER — Ambulatory Visit (INDEPENDENT_AMBULATORY_CARE_PROVIDER_SITE_OTHER): Payer: Medicare Other | Admitting: Cardiology

## 2011-03-08 DIAGNOSIS — K219 Gastro-esophageal reflux disease without esophagitis: Secondary | ICD-10-CM | POA: Insufficient documentation

## 2011-03-08 DIAGNOSIS — D649 Anemia, unspecified: Secondary | ICD-10-CM

## 2011-03-08 DIAGNOSIS — G238 Other specified degenerative diseases of basal ganglia: Secondary | ICD-10-CM

## 2011-03-08 DIAGNOSIS — F17201 Nicotine dependence, unspecified, in remission: Secondary | ICD-10-CM | POA: Insufficient documentation

## 2011-03-08 DIAGNOSIS — E785 Hyperlipidemia, unspecified: Secondary | ICD-10-CM | POA: Insufficient documentation

## 2011-03-08 DIAGNOSIS — I10 Essential (primary) hypertension: Secondary | ICD-10-CM

## 2011-03-08 DIAGNOSIS — G459 Transient cerebral ischemic attack, unspecified: Secondary | ICD-10-CM | POA: Insufficient documentation

## 2011-03-08 DIAGNOSIS — I2510 Atherosclerotic heart disease of native coronary artery without angina pectoris: Secondary | ICD-10-CM | POA: Insufficient documentation

## 2011-03-08 DIAGNOSIS — E039 Hypothyroidism, unspecified: Secondary | ICD-10-CM | POA: Insufficient documentation

## 2011-03-08 DIAGNOSIS — E782 Mixed hyperlipidemia: Secondary | ICD-10-CM

## 2011-03-08 DIAGNOSIS — I951 Orthostatic hypotension: Secondary | ICD-10-CM

## 2011-03-08 DIAGNOSIS — I471 Supraventricular tachycardia: Secondary | ICD-10-CM | POA: Insufficient documentation

## 2011-03-08 MED ORDER — PRAVASTATIN SODIUM 80 MG PO TABS: 80.0000 mg | ORAL_TABLET | Freq: Every day | ORAL | Status: DC

## 2011-03-08 MED ORDER — LISINOPRIL 10 MG PO TABS: 10.0000 mg | ORAL_TABLET | Freq: Every day | ORAL | Status: DC

## 2011-03-08 NOTE — Assessment & Plan Note (Signed)
Patient has done well with no symptoms or recent manifestations of coronary disease.  Chart alludes to an intervention in 1994, but documentation of that procedure is not available.  We will continue to optimally control cardiovascular risk factors.

## 2011-03-08 NOTE — Assessment & Plan Note (Signed)
Attributed to the posterior circulation.  Maintained on clopidogrel at the request of her neurologist.

## 2011-03-08 NOTE — Assessment & Plan Note (Addendum)
Most recent laboratories are consistent with iron deficiency.  I have no record that this diagnosis was pursued or that GI studies have been done since an esophageal dilatation for Schatzki's ring in 02/2010.  I will leave further assessment of these issues to Dr. Lysbeth Galas.

## 2011-03-08 NOTE — Assessment & Plan Note (Signed)
Orthostatic hypotension in 07/2010 following gastroenteritis; no subsequent documentation of orthostatic change in blood pressure.  She does not appear to have true Shy-Drager's.

## 2011-03-08 NOTE — Assessment & Plan Note (Addendum)
Lipid profile was excellent 8 months ago, but she is treated with an expensive drug at low dose.  Pravastatin 80 mg per day should provide at least as much lipid-lowering as rosuvastatin 10 mg and will be substituted for it with a repeat lipid profile in one month.

## 2011-03-08 NOTE — Progress Notes (Signed)
Patient ID: Jennifer [name], female   DOB: 1930/08/07, 78 y.o.   MRN: [id number] HPI: Scheduled return visit for this lovely woman with a history of coronary disease, Parkinson's disease and an episode of orthostatic hypotension 6 months ago.  In the interval since our most recent office visit, she has done quite well.  She's had no further dizziness nor episodes of syncope.  She reports generalized fatigue and difficulty sleeping.  She has chronic low back pain that has improved since a 2nd neurosurgical procedure performed by Dr. Manson [name] in [name] some years ago.  She now has discomfort in her right hip with ambulation, which has been attributed to arthritis.  She has not been seen by an orthopaedic surgeon.  Prior to Admission medications   Medication Sig Start Date End Date Taking? Authorizing Provider  aspirin 325 MG tablet Take 325 mg by mouth daily.     Yes Historical Provider, MD  celecoxib (CELEBREX) 200 MG capsule Take 200 mg by mouth daily.    Yes Historical Provider, MD  clopidogrel (PLAVIX) 75 MG tablet Take 75 mg by mouth daily.     Yes Historical Provider, MD  glyBURIDE-metformin (GLUCOVANCE) 2.5-500 MG per tablet Take 1 tablet by mouth daily with breakfast.   Yes Historical Provider, MD  lansoprazole (PREVACID) 30 MG capsule Take 30 mg by mouth daily.     Yes Historical Provider, MD  levothyroxine (SYNTHROID, LEVOTHROID) 75 MCG tablet Take 75 mcg by mouth daily.     Yes Historical Provider, MD  pramipexole (MIRAPEX) 0.125 MG tablet Take 0.25 mg by mouth 3 (three) times daily.    Yes Historical Provider, MD  pregabalin (LYRICA) 75 MG capsule Take 75 mg by mouth 2 (two) times daily.     Yes Historical Provider, MD  Rasagiline Mesylate (AZILECT) 1 MG TABS Take 1 tablet by mouth daily.    Yes Historical Provider, MD  lisinopril (PRINIVIL,ZESTRIL) 10 MG tablet Take 1 tablet (10 mg total) by mouth daily. 03/08/11 03/07/12  Jennifer Friends. Gaige Fussner, MD  pravastatin (PRAVACHOL) 80 MG tablet Take 1  tablet (80 mg total) by mouth daily. 03/08/11 03/07/12  Jennifer Friends. Dietrich Pates, MD    Allergies  Allergen Reactions  . Ace Inhibitors Other (See Comments)    Hyperkalemia-mild   . Codeine   . Penicillins   . Requip   Past medical history, social history, and family history reviewed and updated.  ROS: Denies orthopnea, PND, chest discomfort, dyspnea, palpitations and syncope.  PHYSICAL EXAM: BP 129/73  Pulse 92  Resp 18  Ht 5\' 3"  (1.6 m)  Wt 77.111 kg (170 lb)  BMI 30.11 kg/m2  General-Well developed; no acute distress Body habitus-mildly overweight Neck-No JVD; no carotid bruits Lungs-clear lung fields; resonant to percussion Cardiovascular-normal PMI; normal S1 and S2; pulse irregularity Abdomen-normal bowel sounds; soft and non-tender without masses or organomegaly Musculoskeletal-No deformities, no cyanosis or clubbing Neurologic-Normal cranial nerves; symmetric strength and tone; resting tremor of the head, hands and left lower extremity especially; masked facies; shuffling gait Skin-Warm, no significant lesions Extremities-distal pulses intact; trace edema  Rhythm Strip: Normal sinus rhythm at a rate of 90 bpm  ASSESSMENT AND PLAN:  Jennifer Bing, MD 03/08/2011 3:40 PM

## 2011-03-08 NOTE — Assessment & Plan Note (Addendum)
Blood pressure control is excellent.  Dose of metoprolol is minimal and subtherapeutic and will be discontinued.  Since she has diabetes, ACE inhibitor will provide blood pressure therapy as well as mitigate proteinuria and renal dysfunction.

## 2011-03-08 NOTE — Patient Instructions (Signed)
**Note De-Identified Dajane Valli Obfuscation** Your physician has recommended you make the following change in your medication: stop taking Metoprolol, start taking Lisinopril 10 mg daily and when you finish current bottle of Crestor start taking Pravastatin 80 mg at bedtime.  Your physician recommends that you return for lab work in: 1 month  Your physician has requested that you regularly monitor and record your blood pressure readings at home. Please use the same machine at the same time of day to check your readings and record them to bring to your follow-up visit. Please call office to report repeated blood pressure  greater than 140 (top number) or greater than 90 (bottom number)  Your physician recommends that you schedule a follow-up appointment in: 1 year

## 2011-03-09 ENCOUNTER — Encounter: Payer: Self-pay | Admitting: *Deleted

## 2011-03-18 ENCOUNTER — Other Ambulatory Visit: Payer: Self-pay | Admitting: Adult Health

## 2011-04-02 ENCOUNTER — Encounter (INDEPENDENT_AMBULATORY_CARE_PROVIDER_SITE_OTHER): Payer: Self-pay | Admitting: *Deleted

## 2011-04-09 ENCOUNTER — Other Ambulatory Visit: Payer: Self-pay | Admitting: *Deleted

## 2011-04-09 ENCOUNTER — Other Ambulatory Visit: Payer: Self-pay | Admitting: Adult Health

## 2011-04-09 DIAGNOSIS — E785 Hyperlipidemia, unspecified: Secondary | ICD-10-CM

## 2011-04-10 ENCOUNTER — Other Ambulatory Visit: Payer: Self-pay | Admitting: Adult Health

## 2011-04-13 ENCOUNTER — Telehealth: Payer: Self-pay | Admitting: *Deleted

## 2011-04-13 ENCOUNTER — Encounter: Payer: Self-pay | Admitting: *Deleted

## 2011-04-24 ENCOUNTER — Ambulatory Visit (INDEPENDENT_AMBULATORY_CARE_PROVIDER_SITE_OTHER): Payer: Medicare Other | Admitting: Internal Medicine

## 2011-05-07 ENCOUNTER — Encounter: Payer: Self-pay | Admitting: Cardiology

## 2011-05-09 ENCOUNTER — Encounter: Payer: Self-pay | Admitting: Cardiology

## 2011-05-15 ENCOUNTER — Encounter (INDEPENDENT_AMBULATORY_CARE_PROVIDER_SITE_OTHER): Payer: Self-pay | Admitting: Internal Medicine

## 2011-05-15 ENCOUNTER — Ambulatory Visit (INDEPENDENT_AMBULATORY_CARE_PROVIDER_SITE_OTHER): Payer: Medicare Other | Admitting: Internal Medicine

## 2011-05-15 VITALS — BP 140/78 | HR 72 | Temp 96.8°F | Resp 18 | Ht 63.0 in | Wt 174.6 lb

## 2011-05-15 DIAGNOSIS — R197 Diarrhea, unspecified: Secondary | ICD-10-CM

## 2011-05-15 NOTE — Patient Instructions (Signed)
High fiber diet. Metamucil 3-4 g daily by mouth. Check with your cardiologist if you could reduce aspirin dose to 81 mg daily. Use celecoxib on as-needed basis rather than every day to reduce risk of GI bleeding and ulcer. Use Glycerin or Dulcolax suppository per rectum daily for one week and thereafter on as-needed basis. Stool diary as to consistency and frequency of stools until office visit in 6 weeks.

## 2011-05-15 NOTE — Consult Note (Signed)
Note dictated; Job [phone]

## 2011-05-16 NOTE — Consult Note (Signed)
NAME:  Jennifer, [name]                     ACCOUNT NO.:  MEDICAL RECORD NO.:  [id number]  LOCATION:                                 FACILITY:  PHYSICIAN:  Lionel December, M.D.    DATE OF BIRTH:  1930/07/27  DATE OF CONSULTATION:  05/15/2011 DATE OF DISCHARGE:                                CONSULTATION   CONSULTING PHYSICIAN:  Delaney Meigs, M.D.  REASON FOR CONSULTATION:  Recurrent diarrhea.  HISTORY OF PRESENT ILLNESS:  Jennifer [name] is a 78 year old Caucasian female who is referred through courtesy of Dr. Lysbeth Galas for GI evaluation.  She is accompanied by her sister-in-law, Ms. [name].  She presents with at least 3 month history of diarrhea.  She had stool studies by Dr. Lysbeth Galas in February, 2013, and these were all negative reviewed under lab data.  She states on her worse days, she will have as many as 6 stools.  She has had multiple accidents.  She describes her stools to be loose and watery.  Diarrhea has not been associated with nausea, vomiting, fever, chills or weight loss.  She states she does not understand why she has gained 3 pounds.  She did use antibiotics a few months ago for her sinusitis, but does not believe it made her diarrhea worse.  She states she has been under lot of stress.  She feels bad all the time.  She states she has used up whole box of Imodium, but does not believe it made any difference.  She did try flour paste which she took 4 doses and she believes it, it helped a great deal.  She has had both diurnal and nocturnal diarrhea.  She states before she developed diarrhea, she either had normal bowel movements or occasionally was constipated.  However, her symptoms have changed completely in the last 3 days.  She has not had a bowel movement and now feels constipated. She denies abdominal pain.  She also denies melena or rectal bleeding. She states her last colonoscopy was perhaps 20 years ago and was normal. These records are not available.  She  has been using depends because of accidents and urgency.  CURRENT MEDICATIONS: 1. Aspirin 325 mg p.o. daily. 2. Solu-Cortef 200 mg p.o. daily. 3. Clopidogrel 75 mg p.o. daily. 4. Dexlansoprazole 60 mg p.o. q.a.m. 5. Glucovance 2.5/500 mg p.o. q.a.m. 6. Levothyroxine 100 mcg p.o. daily. 7. Lisinopril 10 mg p.o. daily. 8. Lopressor 12.5 mg p.o. daily. 9. Pramipexole 0.25 mg p.o. q.i.d. 10.Pregabalin 75 mg p.o. b.i.d. 11.Rasagiline mesylate 1 mg p.o. daily. 12.Rosuvastatin 10 mg p.o. daily.  PAST MEDICAL HISTORY:  She has been diabetic for more than 25 years. She has developed neuropathy secondary to it.  She has coronary artery disease.  She had PTCA in 1994.  She had radiofrequency ablation for atrial tachycardia back in 2006.  She has chronic GERD.  She had esophagogastroduodenoscopy by me in January 2012. For dysphagia, she had Schatzki's ring which was disrupted with a Maloney dilator and she had moderate size sliding hiatal hernia.  She is not having swallowing difficulty anymore.  She has hypothyroidism.  Parkinson disease was diagnosed about 5  years ago.  She had BSO with hysterectomy over 20 years ago.  She has had back surgery x2.  She had decompressive laminectomies with foraminotomies at right L2-L3 and right L3-L4 in June 2007.  She believes her last surgery was in October 2011, in [place] and she had bone graft and provided good relief to her chronic back pain.  Other surgeries include tonsillectomy and appendectomy as a child. Right cataract extraction.  She has hypertension.  Hyperlipidemia.  ALLERGIES:  To CODEINE, PENICILLIN, REQUIP and ACE INHIBITORS.  FAMILY HISTORY:  Father suffered CVA at age 5 and died a year later. Mother died of MI in her 10s.  She had 13 siblings and only 3 are living, 33 is 51 year old and in nursing home.  Another 78 is 76 year old and in the hospital with pneumonia and third one is 76 year old.  SOCIAL HISTORY:  She  is married.  Her husband is not doing well because he got burned with left upper extremity.  The patient states she is able to do simple tasks at home like cleaning, etc.  She has 1 daughter who is in good health and lives next door and her sister also lives close by.  The patient has never drank alcohol and she smoked 1 to 1-1/2 pack a day for 33 years, but quit 4 years ago.  PHYSICAL EXAM:  VITAL SIGNS:  Weight 174.6 pounds, she is 63 inches tall, pulse 72 per minute, blood pressure 140/70, temp is 96.8, respiratory rate is 18. EYES:  Conjunctivae are pink.  Sclerae are nonicteric. MOUTH:  Oropharyngeal mucosa is normal.  She has complete upper plate n.p.o. for teeth in lower jaw and one is in poor condition. NECK:  No neck masses or thyromegaly noted. CARDIAC:  With regular rhythm, normal S1 and S2.  No murmur or gallop noted. LUNGS:  Clear to auscultation. ABDOMEN:  Full.  Bowel sounds are normal on palpation, soft abdomen without tenderness, organomegaly or masses. RECTAL:  Normal sphincter tone.  She has moderate amount of formed stool in the rectum.  Stool is guaiac negative. EXTREMITIES:  No peripheral edema or clubbing noted.  LABORATORY DATA:  From Dr. Joyce Copa office from March 27, 2011, C. diff toxin titer is negative.  Stool O and P negative.  Cultures negative for enteric pathogens.  CBC from August 20, 2010, WBC 5.2, H and H 11.2 and 34.6, platelet count was 151 potassium.  ASSESSMENT:  [name] is a 79 year old Caucasian female who presents with diarrhea of few months duration.  Her stool studies over 7 weeks ago were negative.  It is interesting to know that she has been constipated for the last 3 days and she does have formed stool in her rectum and is quite negative.  Her diarrhea could be spurious due to poor colonic evacuation or she could have diabetic diarrhea or merely an irritable bowel syndrome.  She could  also have collagenous or microscopic colitis.   Since she is constipated and has a formed stool in rectum, I would delay further workup just to see how she responds to therapy.  Second issue is that she is on full dose aspirin, celecoxib and clopidogrel.  She is, therefore, at high-risk for peptic ulcer disease and/or gastrointestinal bleed.  I have asked the patient to check with Dr. Lysbeth Galas or Dr. Dietrich Pates if aspirin dose could be reduced to 81 mg. She also needs to use celecoxib sparingly.  RECOMMENDATIONS:  The patient advised to increase intake of fiber rich foods.  Metamucil 3-4 g p.o. daily samples given.  She is to use glycerin and/or Dulcolax suppository per rectum daily for 1 week and thereafter on as needed basis.  The patient advised to use celecoxib only on as needed basis.  She will decrease aspirin to 81 mg daily if it is all right with Dr. Lysbeth Galas or Dr. Dietrich Pates.  She will call their office.  The patient will keep stool diary as to consistency and frequency of stools until her next visit in 6 weeks.  If she does not do better, we will consider diagnostic colonoscopy.  We appreciate the opportunity to participate in the care of this nice lady.            ______________________________ Lionel December, M.D.     NR/MEDQ  D:  05/15/2011  T:  05/15/2011  Job:  [id number]

## 2011-05-23 NOTE — Progress Notes (Signed)
CONSULTING PHYSICIAN: Delaney Meigs, M.D.  REASON FOR CONSULTATION: Recurrent diarrhea.  HISTORY OF PRESENT ILLNESS: Jennifer [name] is a 78 year old Caucasian female who  is referred through courtesy of Dr. Lysbeth Galas for GI evaluation. She is  accompanied by her sister-in-law, [name].  She presents with at least 3 month history of diarrhea. She had stool  studies by Dr. Lysbeth Galas in February, 2013, and these were all negative  reviewed under lab data. She states on her worse days, she will have as  many as 6 stools. She has had multiple accidents. She describes her  stools to be loose and watery. Diarrhea has not been associated with  nausea, vomiting, fever, chills or weight loss. She states she does not  understand why she has gained 3 pounds. She did use antibiotics a few  months ago for her sinusitis, but does not believe it made her diarrhea  worse. She states she has been under lot of stress. She feels bad all  the time. She states she has used up whole box of Imodium, but does not  believe it made any difference. She did try flour paste which she took  4 doses and she believes it, it helped a great deal. She has had both  diurnal and nocturnal diarrhea. She states before she developed  diarrhea, she either had normal bowel movements or occasionally was  constipated. However, her symptoms have changed completely in the last  3 days. She has not had a bowel movement and now feels constipated.  She denies abdominal pain. She also denies melena or rectal bleeding.  She states her last colonoscopy was perhaps 20 years ago and was normal.  These records are not available. She has been using depends because of  accidents and urgency.  CURRENT MEDICATIONS:  1. Aspirin 325 mg p.o. daily.  2. Solu-Cortef 200 mg p.o. daily.  3. Clopidogrel 75 mg p.o. daily.  4. Dexlansoprazole 60 mg p.o. q.a.m.  5. Glucovance 2.5/500 mg p.o. q.a.m.  6. Levothyroxine 100 mcg p.o. daily.  7. Lisinopril  10 mg p.o. daily.  8. Lopressor 12.5 mg p.o. daily.  9. Pramipexole 0.25 mg p.o. q.i.d.  10.Pregabalin 75 mg p.o. b.i.d.  11.Rasagiline mesylate 1 mg p.o. daily.  12.Rosuvastatin 10 mg p.o. daily.  PAST MEDICAL HISTORY: She has been diabetic for more than 25 years.  She has developed neuropathy secondary to it.  She has coronary artery disease. She had PTCA in 1994. She had  radiofrequency ablation for atrial tachycardia back in 2006. She has  chronic GERD. She had esophagogastroduodenoscopy by me in January 2012.  For dysphagia, she had Schatzki's ring which was disrupted with a  Maloney dilator and she had moderate size sliding hiatal hernia. She is  not having swallowing difficulty anymore.  She has hypothyroidism.  Parkinson disease was diagnosed about 5 years ago.  She had BSO with hysterectomy over 20 years ago.  She has had back surgery x2. She had decompressive laminectomies with  foraminotomies at right L2-L3 and right L3-L4 in June 2007. She  believes her last surgery was in October 2011, in [place] and she  had bone graft and provided good relief to her chronic back pain.  Other surgeries include tonsillectomy and appendectomy as a child.  Right cataract extraction.  She has hypertension.  Hyperlipidemia.  ALLERGIES: To CODEINE, PENICILLIN, REQUIP and ACE INHIBITORS.  FAMILY HISTORY: Father suffered CVA at age 13 and died a year later.  Mother died of MI in her  60s. She had 13 siblings and only 3 are  living, 32 is 29 year old and in nursing home. Another 52 is 76 year old  and in the hospital with pneumonia and third one is 76 year old.  SOCIAL HISTORY: She is married. Her husband is not doing well because  he got burned with left upper extremity. The patient states she is able  to do simple tasks at home like cleaning, etc. She has 1 daughter who  is in good health and lives next door and her sister also lives close  by. The patient has never drank alcohol and she  smoked 1 to 1-1/2 pack  a day for 33 years, but quit 4 years ago.  PHYSICAL EXAM: VITAL SIGNS: Weight 174.6 pounds, she is 63 inches  tall, pulse 72 per minute, blood pressure 140/70, temp is 96.8,  respiratory rate is 18.  EYES: Conjunctivae are pink. Sclerae are nonicteric.  MOUTH: Oropharyngeal mucosa is normal. She has complete upper plate  n.p.o. for teeth in lower jaw and one is in poor condition.  NECK: No neck masses or thyromegaly noted.  CARDIAC: With regular rhythm, normal S1 and S2. No murmur or gallop  noted.  LUNGS: Clear to auscultation.  ABDOMEN: Full. Bowel sounds are normal on palpation, soft abdomen  without tenderness, organomegaly or masses.  RECTAL: Normal sphincter tone. She has moderate amount of formed stool  in the rectum. Stool is guaiac negative.  EXTREMITIES: No peripheral edema or clubbing noted.  LABORATORY DATA: From Dr. Joyce Copa office from March 27, 2011, C.  diff toxin titer is negative.  Stool O and P negative.  Cultures negative for enteric pathogens.  CBC from August 20, 2010, WBC 5.2, H and H 11.2 and 34.6, platelet count  was 151 potassium.  ASSESSMENT: [name] is a 78 year old Caucasian female who presents with  diarrhea of few months duration. Her stool studies over 7 weeks ago  were negative. It is interesting to know that she has been constipated  for the last 3 days and she does have formed stool in her rectum and is  quite negative.  Her diarrhea could be spurious due to poor colonic evacuation or she  could have diabetic diarrhea or merely an irritable bowel syndrome. She  could also have collagenous or microscopic colitis. Since she is  constipated and has a formed stool in rectum, I would delay further  workup just to see how she responds to therapy.  Second issue is that she is on full dose aspirin, celecoxib and  clopidogrel. She is, therefore, at high-risk for peptic ulcer disease  and/or gastrointestinal bleed. I have asked the  patient to check with  Dr. Lysbeth Galas or Dr. Dietrich Pates if aspirin dose could be reduced to 81 mg.  She also needs to use celecoxib sparingly.  RECOMMENDATIONS: The patient advised to increase intake of fiber rich  foods.  Metamucil 3-4 g p.o. daily samples given.  She is to use glycerin and/or Dulcolax suppository per rectum daily for  1 week and thereafter on as needed basis.  The patient advised to use celecoxib only on as needed basis.  She will decrease aspirin to 81 mg daily if it is all right with Dr.  Lysbeth Galas or Dr. Dietrich Pates. She will call their office.  The patient will keep stool diary as to consistency and frequency of  stools until her next visit in 6 weeks.  If she does not do better, we will consider diagnostic colonoscopy.  We appreciate the opportunity to participate  in the care of this nice  lady.

## 2011-07-02 ENCOUNTER — Encounter (INDEPENDENT_AMBULATORY_CARE_PROVIDER_SITE_OTHER): Payer: Self-pay | Admitting: Internal Medicine

## 2011-07-02 ENCOUNTER — Ambulatory Visit (INDEPENDENT_AMBULATORY_CARE_PROVIDER_SITE_OTHER): Payer: Medicare Other | Admitting: Internal Medicine

## 2011-07-02 VITALS — BP 108/70 | HR 68 | Temp 97.5°F | Resp 20 | Ht 61.0 in | Wt 175.0 lb

## 2011-07-02 DIAGNOSIS — K219 Gastro-esophageal reflux disease without esophagitis: Secondary | ICD-10-CM

## 2011-07-02 DIAGNOSIS — R197 Diarrhea, unspecified: Secondary | ICD-10-CM

## 2011-07-02 NOTE — Progress Notes (Signed)
Presenting complaint;  Followup for diarrhea.  Subjective:  Patient is a 78 year old Caucasian female who is here for followup of her diarrhea. She was last seen about 6 weeks ago. Today she is accompanied by her husband. She feels much better. On most days she has 1-2 formed stools. On few occasions she has felt constipated and uses suppository with quick relief. She denies melena or rectal bleeding her appetite is very good. Patient's last colonoscopy was done over 10 years ago and she is not interested in having one now unless symptoms progress or she passes blood per rectum. She would like to get some samples of Dexilant as she cannot afford this medication. She is on this PPI in order to prevent interaction with clopidogrel.  Current Medications: Current Outpatient Prescriptions  Medication Sig Dispense Refill  . aspirin 81 MG tablet Take 81 mg by mouth daily.      . celecoxib (CELEBREX) 200 MG capsule Take 200 mg by mouth daily.       . clopidogrel (PLAVIX) 75 MG tablet Take 75 mg by mouth daily.        Marland Kitchen glyBURIDE-metformin (GLUCOVANCE) 2.5-500 MG per tablet Take 1 tablet by mouth daily with breakfast.      . levothyroxine (SYNTHROID, LEVOTHROID) 75 MCG tablet Take 100 mcg by mouth daily.       . metoprolol tartrate (LOPRESSOR) 25 MG tablet Take 25 mg by mouth. Patient takes 1/2 tablet a day      . pramipexole (MIRAPEX) 0.125 MG tablet Take 0.25 mg by mouth 4 (four) times daily.       . pregabalin (LYRICA) 75 MG capsule Take 75 mg by mouth 2 (two) times daily.        . Rasagiline Mesylate (AZILECT) 1 MG TABS Take 1 tablet by mouth daily.       . rosuvastatin (CRESTOR) 10 MG tablet Take 10 mg by mouth daily.      Marland Kitchen dexlansoprazole (DEXILANT) 60 MG capsule Take 60 mg by mouth daily.      Marland Kitchen lisinopril (PRINIVIL,ZESTRIL) 10 MG tablet Take 1 tablet (10 mg total) by mouth daily.  30 tablet  11     Objective: Blood pressure 108/70, pulse 68, temperature 97.5 F (36.4 C), temperature  source Oral, resp. rate 20, height 5\' 1"  (1.549 m), weight 175 lb (79.379 kg). Conjunctiva is pink. Sclera is nonicteric Oropharyngeal mucosa is normal. No neck masses or thyromegaly noted. Abdomen is full. It is soft and nontender without organomegaly or masses.  She does not have peripheral edema. She has pill-rolling tremors to both hands.   Assessment:  #1. Irregular bowels felt to be secondary to irritable bowel syndrome. She has responded nicely to simple measures. #2. GERD. Symptoms well controlled with therapy. Ideally she should be on Dexilant.  Plan:  Continue fiber supplements and when necessary use of Dulcolax suppository. Samples of Dexon given to the patient. Office visit in one year

## 2011-07-02 NOTE — Patient Instructions (Addendum)
Please take fiber supplement 4 g every day rather than on as-needed basis.

## 2011-08-07 ENCOUNTER — Encounter (INDEPENDENT_AMBULATORY_CARE_PROVIDER_SITE_OTHER): Payer: Self-pay

## 2012-02-20 ENCOUNTER — Encounter: Payer: Self-pay | Admitting: Cardiology

## 2012-02-20 ENCOUNTER — Ambulatory Visit (INDEPENDENT_AMBULATORY_CARE_PROVIDER_SITE_OTHER): Payer: Medicare Other | Admitting: Cardiology

## 2012-02-20 VITALS — BP 146/72 | HR 80 | Ht 61.0 in | Wt 179.0 lb

## 2012-02-20 DIAGNOSIS — I471 Supraventricular tachycardia: Secondary | ICD-10-CM

## 2012-02-20 DIAGNOSIS — G459 Transient cerebral ischemic attack, unspecified: Secondary | ICD-10-CM

## 2012-02-20 DIAGNOSIS — I10 Essential (primary) hypertension: Secondary | ICD-10-CM

## 2012-02-20 DIAGNOSIS — I951 Orthostatic hypotension: Secondary | ICD-10-CM | POA: Insufficient documentation

## 2012-02-20 NOTE — Progress Notes (Signed)
HPI The patient presents to me because she lives in this area. She is new to me. She has a history of previous vascular disease with apparent angioplasty in 1990s I was able to review a catheterization 2006 when she had nonobstructive disease. She's been followed for orthostatic hypotension. This was her most recent problem but she's not bothered by this any longer. She will rarely get lightheaded. She's not had any presyncope or syncope. She will rarely get chest discomfort and she does at times take a sublingual nitroglycerin but this has been a stable pattern. She's very limited in her activities by back and leg pain. She is going to have this evaluated in and possibly get spinal injection. She says she has a bulging disc.  Allergies  Allergen Reactions  . Ace Inhibitors Other (See Comments)    Hyperkalemia-mild   . Codeine   . Penicillins   . Ropinirole Hcl     Current Outpatient Prescriptions  Medication Sig Dispense Refill  . aspirin 81 MG tablet Take 81 mg by mouth daily.      . celecoxib (CELEBREX) 200 MG capsule Take 200 mg by mouth daily.       . clopidogrel (PLAVIX) 75 MG tablet Take 75 mg by mouth daily.        Marland Kitchen dexlansoprazole (DEXILANT) 60 MG capsule Take 60 mg by mouth daily.      Marland Kitchen glyBURIDE-metformin (GLUCOVANCE) 2.5-500 MG per tablet Take 1 tablet by mouth daily with breakfast.      . levothyroxine (SYNTHROID, LEVOTHROID) 75 MCG tablet Take 100 mcg by mouth daily.       Marland Kitchen lisinopril (PRINIVIL,ZESTRIL) 10 MG tablet Take 1 tablet (10 mg total) by mouth daily.  30 tablet  11  . metoprolol tartrate (LOPRESSOR) 25 MG tablet Take 25 mg by mouth. Patient takes 1/2 tablet a day      . pramipexole (MIRAPEX) 0.125 MG tablet Take 0.25 mg by mouth 2 (two) times daily.       . Rasagiline Mesylate (AZILECT) 1 MG TABS Take 1 tablet by mouth daily.       . rosuvastatin (CRESTOR) 10 MG tablet Take 10 mg by mouth daily.        Past Medical History  Diagnosis Date  . Hypertension     . Arteriosclerotic cardiovascular disease (ASCVD) 1994    1994-PCI to RCA and LAD; nonsustained ventricular tachycardia;cath in 11/06-30% left main, 50% proximal RCA, normal EF; Echo in 06/2010-mild LVH, hyperdynamic LV function  . Gastroesophageal reflux disease     Dysmotility, hiatal hernia; Schatzki's ring-dilated in 02/2010  . Diabetes mellitus, type 2   . UTI (lower urinary tract infection)   . 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 Anemia     Normocytic; with thrombocytopenia  . Parkinson disease   . Nephrolithiasis   . PSVT (paroxysmal supraventricular tachycardia) 2006    S/p RFA-2006  . Tobacco abuse, in remission     50 pack years  . Obesity   . TIA (transient ischemic attack)   . Hyperlipidemia     Past Surgical History  Procedure Date  . Kidney stone surgery   . Lumbar spine surgery 2007    L2-3 and L3-4 decompressive laminotomies and foraminotomies  . Radiofrequency ablation 2006    atrial tachycardia Dr.Gregg Ladona Ridgel  . Total abdominal hysterectomy   . Appendectomy     ROS:   Positive for all. Otherwise as stated in the HPI and  negative for all other systems.   PHYSICAL EXAM BP 146/72  Pulse 80  Ht 5\' 1"  (1.549 m)  Wt 179 lb (81.194 kg)  BMI 33.82 kg/m2  SpO2 99% GENERAL:  Well appearing HEENT:  Pupils equal round and reactive, fundi not visualized, oral mucosa unremarkable NECK:  No jugular venous distention, waveform within normal limits, carotid upstroke brisk and symmetric, no bruits, no thyromegaly LYMPHATICS:  No cervical, inguinal adenopathy LUNGS:  Clear to auscultation bilaterally BACK:  No CVA tenderness CHEST:  Unremarkable HEART:  PMI not displaced or sustained,S1 and S2 within normal limits, no S3, no S4, no clicks, no rubs, no murmurs ABD:  Flat, positive bowel sounds normal in frequency in pitch, no bruits, no rebound, no guarding, no midline pulsatile mass, no hepatomegaly, no splenomegaly EXT:  2 plus  pulses throughout, no edema, no cyanosis no clubbing SKIN:  No rashes no nodules NEURO:  Cranial nerves II through XII grossly intact, motor grossly intact throughout, feel rolling tremor PSYCH:  Cognitively intact, oriented to person place and time   ASSESSMENT AND PLAN  Arteriosclerotic cardiovascular disease (ASCVD) -  The patient has no new sypmtoms.  No further cardiovascular testing is indicated.  We will continue with aggressive risk reduction and meds as listed.  Of note she could come off her Plavix for 7 days as needed if she has spinal injection.  Hyperlipidemia -  I will defer to Dr. Lysbeth Galas.  Given how well she is doing from a cardiovascular standpoint and other comorbidities I would  not titrate to maximum therapy  If fast as is suggested by guidelines.  Hypertension -  Although her blood pressure is slightly elevated today I will not titrate her medicines with her previous history of orthostasis. No change in therapy is indicated.   TIA (transient ischemic attack) -  Attributed to the posterior circulation. Maintained on clopidogrel at the request of her neurologist.

## 2012-02-20 NOTE — Patient Instructions (Addendum)
The current medical regimen is effective;  continue present plan and medications.  Follow up in 1 year with Dr Hochrein.  You will receive a letter in the mail 2 months before you are due.  Please call us when you receive this letter to schedule your follow up appointment.  

## 2012-06-19 ENCOUNTER — Encounter (INDEPENDENT_AMBULATORY_CARE_PROVIDER_SITE_OTHER): Payer: Self-pay | Admitting: *Deleted

## 2012-06-30 ENCOUNTER — Ambulatory Visit (INDEPENDENT_AMBULATORY_CARE_PROVIDER_SITE_OTHER): Payer: Medicare Other | Admitting: Internal Medicine

## 2012-09-16 ENCOUNTER — Ambulatory Visit (INDEPENDENT_AMBULATORY_CARE_PROVIDER_SITE_OTHER): Payer: Medicare Other | Admitting: Internal Medicine

## 2012-11-20 ENCOUNTER — Observation Stay (HOSPITAL_COMMUNITY)
Admission: EM | Admit: 2012-11-20 | Discharge: 2012-11-21 | Disposition: A | Discharge status: Home / Self Care | Payer: Medicare Other | Attending: Internal Medicine | Admitting: Internal Medicine

## 2012-11-20 ENCOUNTER — Other Ambulatory Visit: Payer: Self-pay

## 2012-11-20 ENCOUNTER — Encounter (HOSPITAL_COMMUNITY): Payer: Self-pay | Admitting: Emergency Medicine

## 2012-11-20 DIAGNOSIS — E119 Type 2 diabetes mellitus without complications: Secondary | ICD-10-CM

## 2012-11-20 DIAGNOSIS — R197 Diarrhea, unspecified: Secondary | ICD-10-CM

## 2012-11-20 DIAGNOSIS — F17201 Nicotine dependence, unspecified, in remission: Secondary | ICD-10-CM

## 2012-11-20 DIAGNOSIS — I471 Supraventricular tachycardia, unspecified: Secondary | ICD-10-CM

## 2012-11-20 DIAGNOSIS — K219 Gastro-esophageal reflux disease without esophagitis: Secondary | ICD-10-CM

## 2012-11-20 DIAGNOSIS — D649 Anemia, unspecified: Secondary | ICD-10-CM

## 2012-11-20 DIAGNOSIS — R55 Syncope and collapse: Principal | ICD-10-CM

## 2012-11-20 DIAGNOSIS — G459 Transient cerebral ischemic attack, unspecified: Secondary | ICD-10-CM

## 2012-11-20 DIAGNOSIS — E039 Hypothyroidism, unspecified: Secondary | ICD-10-CM

## 2012-11-20 DIAGNOSIS — I951 Orthostatic hypotension: Secondary | ICD-10-CM

## 2012-11-20 DIAGNOSIS — N179 Acute kidney failure, unspecified: Secondary | ICD-10-CM

## 2012-11-20 DIAGNOSIS — I2510 Atherosclerotic heart disease of native coronary artery without angina pectoris: Secondary | ICD-10-CM

## 2012-11-20 DIAGNOSIS — G20C Parkinsonism, unspecified: Secondary | ICD-10-CM

## 2012-11-20 DIAGNOSIS — G238 Other specified degenerative diseases of basal ganglia: Secondary | ICD-10-CM | POA: Insufficient documentation

## 2012-11-20 DIAGNOSIS — E785 Hyperlipidemia, unspecified: Secondary | ICD-10-CM

## 2012-11-20 DIAGNOSIS — N390 Urinary tract infection, site not specified: Secondary | ICD-10-CM

## 2012-11-20 DIAGNOSIS — I10 Essential (primary) hypertension: Secondary | ICD-10-CM

## 2012-11-20 LAB — CBC WITH DIFFERENTIAL/PLATELET
Basophils Absolute: 0.1 10*3/uL (ref 0.0–0.1)
Eosinophils Relative: 2 % (ref 0–5)
HCT: 37.2 % (ref 36.0–46.0)
Hemoglobin: 12.3 g/dL (ref 12.0–15.0)
Lymphocytes Relative: 27 % (ref 12–46)
Lymphs Abs: 1.4 10*3/uL (ref 0.7–4.0)
MCV: 91.2 fL (ref 78.0–100.0)
Monocytes Absolute: 0.3 10*3/uL (ref 0.1–1.0)
Monocytes Relative: 5 % (ref 3–12)
Neutro Abs: 3.5 10*3/uL (ref 1.7–7.7)
Platelets: 161 10*3/uL (ref 150–400)
RBC: 4.08 MIL/uL (ref 3.87–5.11)
RDW: 13.8 % (ref 11.5–15.5)
WBC: 5.3 10*3/uL (ref 4.0–10.5)

## 2012-11-20 LAB — URINALYSIS, ROUTINE W REFLEX MICROSCOPIC
Bilirubin Urine: NEGATIVE
Glucose, UA: NEGATIVE mg/dL
Ketones, ur: NEGATIVE mg/dL
Nitrite: POSITIVE — AB
Specific Gravity, Urine: 1.025 (ref 1.005–1.030)
Urobilinogen, UA: 0.2 mg/dL (ref 0.0–1.0)
pH: 5.5 (ref 5.0–8.0)

## 2012-11-20 LAB — GLUCOSE, CAPILLARY: Glucose-Capillary: 120 mg/dL — ABNORMAL HIGH (ref 70–99)

## 2012-11-20 LAB — BASIC METABOLIC PANEL
BUN: 22 mg/dL (ref 6–23)
CO2: 26 mEq/L (ref 19–32)
Calcium: 9.6 mg/dL (ref 8.4–10.5)
Chloride: 103 mEq/L (ref 96–112)
Creatinine, Ser: 1.24 mg/dL — ABNORMAL HIGH (ref 0.50–1.10)
GFR calc non Af Amer: 40 mL/min — ABNORMAL LOW (ref >90)
Glucose, Bld: 77 mg/dL (ref 70–99)

## 2012-11-20 LAB — URINE MICROSCOPIC-ADD ON

## 2012-11-20 LAB — TROPONIN I: Troponin I: <0.3 ng/mL (ref <0.30)

## 2012-11-20 MED ORDER — ENOXAPARIN SODIUM 40 MG/0.4ML ~~LOC~~ SOLN
40.0000 mg | SUBCUTANEOUS | Status: DC
  Administered 2012-11-20: 40 mg via SUBCUTANEOUS
  Filled 2012-11-20: qty 0.4

## 2012-11-20 MED ORDER — SODIUM CHLORIDE 0.9 % IV BOLUS (SEPSIS)
500.0000 mL | Freq: Once | INTRAVENOUS | Status: AC
  Administered 2012-11-20: 12:00:00 via INTRAVENOUS

## 2012-11-20 MED ORDER — ASPIRIN 81 MG PO CHEW
81.0000 mg | CHEWABLE_TABLET | Freq: Every day | ORAL | Status: DC
  Administered 2012-11-20 – 2012-11-21 (×2): 81 mg via ORAL
  Filled 2012-11-20 (×2): qty 1

## 2012-11-20 MED ORDER — ONDANSETRON HCL 4 MG PO TABS: 4.0000 mg | ORAL_TABLET | Freq: Four times a day (QID) | ORAL | Status: DC | PRN

## 2012-11-20 MED ORDER — ACETAMINOPHEN 650 MG RE SUPP: 650.0000 mg | Freq: Four times a day (QID) | RECTAL | Status: DC | PRN

## 2012-11-20 MED ORDER — INSULIN ASPART 100 UNIT/ML ~~LOC~~ SOLN: 0.0000 [IU] | Freq: Three times a day (TID) | SUBCUTANEOUS | Status: DC

## 2012-11-20 MED ORDER — LEVOTHYROXINE SODIUM 75 MCG PO TABS
75.0000 ug | ORAL_TABLET | Freq: Every day | ORAL | Status: DC
Start: 2012-11-21 — End: 2012-11-21
  Administered 2012-11-21: 75 ug via ORAL
  Filled 2012-11-20: qty 1

## 2012-11-20 MED ORDER — ACETAMINOPHEN 325 MG PO TABS: 650.0000 mg | ORAL_TABLET | Freq: Four times a day (QID) | ORAL | Status: DC | PRN

## 2012-11-20 MED ORDER — ATORVASTATIN CALCIUM 10 MG PO TABS: 10.0000 mg | ORAL_TABLET | Freq: Every day | ORAL | Status: DC

## 2012-11-20 MED ORDER — CARBIDOPA-LEVODOPA 10-100 MG PO TABS
1.0000 | ORAL_TABLET | Freq: Two times a day (BID) | ORAL | Status: DC
  Administered 2012-11-20 – 2012-11-21 (×2): 1 via ORAL
  Filled 2012-11-20 (×8): qty 1

## 2012-11-20 MED ORDER — DOCUSATE SODIUM 100 MG PO CAPS
100.0000 mg | ORAL_CAPSULE | Freq: Two times a day (BID) | ORAL | Status: DC
  Administered 2012-11-20 – 2012-11-21 (×3): 100 mg via ORAL
  Filled 2012-11-20 (×3): qty 1

## 2012-11-20 MED ORDER — INSULIN ASPART 100 UNIT/ML ~~LOC~~ SOLN: 0.0000 [IU] | Freq: Every day | SUBCUTANEOUS | Status: DC

## 2012-11-20 MED ORDER — SODIUM CHLORIDE 0.9 % IV SOLN
INTRAVENOUS | Status: DC
  Administered 2012-11-21: 04:00:00 via INTRAVENOUS

## 2012-11-20 MED ORDER — CIPROFLOXACIN IN D5W 200 MG/100ML IV SOLN
200.0000 mg | Freq: Two times a day (BID) | INTRAVENOUS | Status: DC
  Administered 2012-11-20 – 2012-11-21 (×2): 200 mg via INTRAVENOUS
  Filled 2012-11-20 (×8): qty 100

## 2012-11-20 MED ORDER — TRAZODONE HCL 50 MG PO TABS: 25.0000 mg | ORAL_TABLET | Freq: Every evening | ORAL | Status: DC | PRN

## 2012-11-20 MED ORDER — ALUM & MAG HYDROXIDE-SIMETH 200-200-20 MG/5ML PO SUSP: 30.0000 mL | Freq: Four times a day (QID) | ORAL | Status: DC | PRN

## 2012-11-20 MED ORDER — ONDANSETRON HCL 4 MG/2ML IJ SOLN: 4.0000 mg | Freq: Four times a day (QID) | INTRAMUSCULAR | Status: DC | PRN

## 2012-11-20 MED ORDER — BISACODYL 10 MG RE SUPP: 10.0000 mg | Freq: Every day | RECTAL | Status: DC | PRN

## 2012-11-20 MED ORDER — PRAMIPEXOLE DIHYDROCHLORIDE 0.25 MG PO TABS
0.1250 mg | ORAL_TABLET | Freq: Two times a day (BID) | ORAL | Status: DC
  Administered 2012-11-20 – 2012-11-21 (×2): 0.125 mg via ORAL
  Filled 2012-11-20: qty 1
  Filled 2012-11-20 (×3): qty 0.5
  Filled 2012-11-20: qty 1
  Filled 2012-11-20 (×3): qty 0.5
  Filled 2012-11-20: qty 1

## 2012-11-20 MED ORDER — CLOPIDOGREL BISULFATE 75 MG PO TABS
75.0000 mg | ORAL_TABLET | Freq: Every day | ORAL | Status: DC
  Administered 2012-11-21: 75 mg via ORAL
  Filled 2012-11-20: qty 1

## 2012-11-20 NOTE — H&P (Signed)
Patient seen and examined.  Agree with note as above.  Patient was admitted to the hospital with transient hypotension, bradycardia and lightheadedness.  She was found to have uti by urinalysis.  She will be monitored on telemetry and will get 2d echo.  If work up is unrevealing, she may need follow up with cardiology (Hochrien) to be evaluated for holter monitor.  Her parkinsons may be playing a role as well due to orthostatic hypotension.  Agree with holding antihypertensives for now. Possible discharge home in am.  Manuelita Moxon

## 2012-11-20 NOTE — ED Notes (Signed)
Pt states she was at her PCP two weeks ago and had her bp med changed.states her bp was low today and she was taken off her lisinopril and celebrex

## 2012-11-20 NOTE — ED Provider Notes (Signed)
CSN: [id number]     Arrival date & time 11/20/12  1104 History  This chart was scribed for Roney Marion, MD, by Yevette Edwards, ED Scribe. This patient was seen in room APA06/APA06 and the patient's care was started at 11:38 AM. First MD Initiated Contact with Patient 11/20/12 1124     Chief Complaint  Patient presents with  . Hypotension    The history is provided by the patient, the spouse and a relative. No language interpreter was used.   HPI Comments: Jennifer [name] is a 79 y.o. female, with a h/o HTN, TIA, and ASCVD, who presents to the Emergency Department complaining of hypotension.  At her PCP's office today PTA in the ED, she was told that her blood sugar was high, her blood pressure was low, and her heart rate was decreased. She reports that she has experienced episodes of lightheadedness which have been occurring intermittently for approximately three week, but which have increased in occurrence in the past several days. She has also experienced nausea, a headache, and intermittent SOB as associated symptoms; she experienced SOB today. She denies any syncope, and she denies any palpations associated with the lightheadedness. The pt also denies a diminished appetite, emesis, or diarrhea. When she visited her PCP last week, she had medication changes including being taken off lisinopril and a change from celebrex to Coenzyme Q10 and excedrin. She reports that she took half of a tablet metoprolol this morning.  She denies a h/o MIA or anemia.The pt is a former smoker.    Dr. Denzil Magnuson is her PCP.  Dr. Karl Bales at [place] is her cardiologist. She had a second pacemaker cauterized.   Past Medical History  Diagnosis Date  . Hypertension   . Arteriosclerotic cardiovascular disease (ASCVD) 1994    1994-PCI to RCA and LAD; nonsustained ventricular tachycardia;cath in 11/06-30% left main, 50% proximal RCA, normal EF; Echo in 06/2010-mild LVH, hyperdynamic LV function  . Gastroesophageal reflux  disease     Dysmotility, hiatal hernia; Schatzki's ring-dilated in 02/2010  . Diabetes mellitus, type 2   . UTI (lower urinary tract infection)   . 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 Anemia     Normocytic; with thrombocytopenia  . Parkinson disease   . Nephrolithiasis   . PSVT (paroxysmal supraventricular tachycardia) 2006    S/p RFA-2006  . Tobacco abuse, in remission     50 pack years  . Obesity   . TIA (transient ischemic attack)   . Hyperlipidemia    Past Surgical History  Procedure Laterality Date  . Kidney stone surgery    . Lumbar spine surgery  2007    L2-3 and L3-4 decompressive laminotomies and foraminotomies  . Radiofrequency ablation  2006    atrial tachycardia Dr.Gregg Ladona Ridgel  . Total abdominal hysterectomy    . Appendectomy     Family History  Problem Relation Age of Onset  . Coronary artery disease Father   . Stroke Father   . Coronary artery disease Mother     also a sibling  . Heart disease Mother   . Heart disease Sister   . Heart disease Sister   . Heart disease Sister   . Heart disease Sister   . Heart disease Sister   . Heart disease Sister   . Heart disease Brother   . Heart disease Brother   . Heart disease Brother   . Heart disease Brother   . Heart disease Brother   .  Healthy Daughter    History  Substance Use Topics  . Smoking status: Former Smoker -- 1.00 packs/day for 50 years    Types: Cigarettes    Quit date: 05/14/2008  . Smokeless tobacco: Never Used     Comment: Patient smokes 1 to 1 1/2 a day  . Alcohol Use: No   No OB history provided.  Review of Systems  Constitutional: Positive for unexpected weight change (15 pounds within three months. ). Negative for fever and appetite change.  Respiratory: Positive for shortness of breath.   Cardiovascular: Negative for palpitations.  Gastrointestinal: Positive for nausea. Negative for vomiting and diarrhea.  Neurological: Positive for  light-headedness and headaches.  All other systems reviewed and are negative.    Allergies  Ace inhibitors; Codeine; Penicillins; and Ropinirole hcl  Home Medications   Current Outpatient Rx  Name  Route  Sig  Dispense  Refill  . acetaminophen (TYLENOL) 650 MG CR tablet   Oral   Take 650 mg by mouth 2 (two) times daily as needed for pain.         Marland Kitchen aspirin 81 MG tablet   Oral   Take 81 mg by mouth daily.         . carbidopa-levodopa (SINEMET IR) 10-100 MG per tablet   Oral   Take 1-2 tablets by mouth 2 (two) times daily. Takes 1 tablet in the morning & 2 after supper.         . clopidogrel (PLAVIX) 75 MG tablet   Oral   Take 75 mg by mouth daily.           . Coenzyme Q10 (CO Q-10 PO)   Oral   Take 1 capsule by mouth daily.         Marland Kitchen dexlansoprazole (DEXILANT) 60 MG capsule   Oral   Take 60 mg by mouth daily.         . pramipexole (MIRAPEX) 0.125 MG tablet   Oral   Take 0.125 mg by mouth 2 (two) times daily.          . Rasagiline Mesylate (AZILECT) 1 MG TABS   Oral   Take 1 tablet by mouth daily.          . rosuvastatin (CRESTOR) 10 MG tablet   Oral   Take 10 mg by mouth daily.         . ciprofloxacin (CIPRO) 500 MG tablet   Oral   Take 1 tablet (500 mg total) by mouth 2 (two) times daily.   10 tablet   0   . levothyroxine (SYNTHROID) 50 MCG tablet   Oral   Take 1 tablet (50 mcg total) by mouth daily before breakfast.   30 tablet   0    Triage Vitals: BP 114/58  Pulse 64  Temp(Src) 97.8 F (36.6 C) (Oral)  Resp 14  Ht 5\' 3"  (1.6 m)  Wt 150 lb (68.04 kg)  BMI 26.58 kg/m2  SpO2 96%  Physical Exam  Nursing note and vitals reviewed. Constitutional: She is oriented to person, place, and time. She appears well-developed and well-nourished. No distress.  HENT:  Head: Normocephalic and atraumatic.  Mouth/Throat: Oropharynx is clear and moist.  Eyes: Pupils are equal, round, and reactive to light. Right eye exhibits no discharge.  Left eye exhibits no discharge. No scleral icterus.  Slight pale to conjunctiva.   Neck: Neck supple. No tracheal deviation present.  Cardiovascular: Normal rate, regular rhythm and normal heart sounds.  No murmur heard. No bruits.   Pulmonary/Chest: Effort normal and breath sounds normal. No respiratory distress. She has no wheezes.  Musculoskeletal: Normal range of motion.  Neurological: She is alert and oriented to person, place, and time.  Fine resting tremor.   Skin: Skin is warm and dry.  Psychiatric: She has a normal mood and affect. Her behavior is normal.    ED Course  Procedures (including critical care time)  EKG: Indication dizziness. Interpretation sinus paced rhythm. Occasional PACs. Occasional PVCs. Or rubs.  DIAGNOSTIC STUDIES: Oxygen Saturation is 96% on room air, normal by my interpretation.    COORDINATION OF CARE:  11:55 AM- Discussed treatment plan with patient, and the patient agreed to the plan.   Labs Review Labs Reviewed  BASIC METABOLIC PANEL - Abnormal; Notable for the following:    Creatinine, Ser 1.24 (*)    GFR calc non Af Amer 40 (*)    GFR calc Af Amer 46 (*)    All other components within normal limits  URINALYSIS, ROUTINE W REFLEX MICROSCOPIC - Abnormal; Notable for the following:    Hgb urine dipstick TRACE (*)    Protein, ur TRACE (*)    Nitrite POSITIVE (*)    Leukocytes, UA TRACE (*)    All other components within normal limits  URINE MICROSCOPIC-ADD ON - Abnormal; Notable for the following:    Squamous Epithelial / LPF FEW (*)    Bacteria, UA MANY (*)    All other components within normal limits  TSH - Abnormal; Notable for the following:    TSH 0.219 (*)    All other components within normal limits  HEMOGLOBIN A1C - Abnormal; Notable for the following:    Hemoglobin A1C 6.9 (*)    Mean Plasma Glucose 151 (*)    All other components within normal limits  GLUCOSE, CAPILLARY - Abnormal; Notable for the following:     Glucose-Capillary 120 (*)    All other components within normal limits  BASIC METABOLIC PANEL - Abnormal; Notable for the following:    Glucose, Bld 104 (*)    Creatinine, Ser 1.17 (*)    GFR calc non Af Amer 43 (*)    GFR calc Af Amer 50 (*)    All other components within normal limits  GLUCOSE, CAPILLARY - Abnormal; Notable for the following:    Glucose-Capillary 113 (*)    All other components within normal limits  GLUCOSE, CAPILLARY - Abnormal; Notable for the following:    Glucose-Capillary 50 (*)    All other components within normal limits  GLUCOSE, CAPILLARY - Abnormal; Notable for the following:    Glucose-Capillary 160 (*)    All other components within normal limits  GLUCOSE, CAPILLARY - Abnormal; Notable for the following:    Glucose-Capillary 52 (*)    All other components within normal limits  GLUCOSE, CAPILLARY - Abnormal; Notable for the following:    Glucose-Capillary 156 (*)    All other components within normal limits  GLUCOSE, CAPILLARY - Abnormal; Notable for the following:    Glucose-Capillary 119 (*)    All other components within normal limits  GLUCOSE, CAPILLARY - Abnormal; Notable for the following:    Glucose-Capillary 108 (*)    All other components within normal limits  URINE CULTURE  URINE CULTURE  CBC WITH DIFFERENTIAL  TROPONIN I  TROPONIN I  TROPONIN I  T3, FREE  T4, FREE   Imaging Review No results found.  EKG Interpretation     Ventricular  Rate:    PR Interval:    QRS Duration:   QT Interval:    QTC Calculation:   R Axis:     Text Interpretation:              MDM   1. Near syncope   2. Parkinsonian syndrome with idiopathic orthostatic hypotension   3. UTI (lower urinary tract infection)   4. Diabetes mellitus   5. Hypertension   6. Arteriosclerotic cardiovascular disease (ASCVD)   7. Gastroesophageal reflux disease   8. Hypothyroid   9. Anemia   10. Hyperlipidemia   11. Pre-syncope   12. Acute renal failure    13. PSVT (paroxysmal supraventricular tachycardia)   14. Tobacco abuse, in remission   15. TIA (transient ischemic attack)   16. Diarrhea   17. Orthostatic hypotension     3 weeks of near syncopal episodes more frequent over the last 2-3 days. Hypotension with a pressure of a high 80s and heart rate of 50 at her physician's office today. He was seen by her physician a week ago and had her Zestril discontinued. There is some uncertainty between she and her family she thinks she is continued to take half of this dosage. She continues on metoprolol as well. Her pressure here is 11. Her heart rate here is 65. At rest he is asymptomatic sitting. Her symptoms do not sound purely orthostatic. She has symptoms at rest sometimes. She denies palpitations. She's not recall the sensation of tachycardia palpitations that she had had in the past with her PSVT before her radiofrequency ablation. However, she denies palpitations now. This may be a simple matter of not eating her medications. Given her gentle fluid hydration here. I have placed a call to hospitalist request admission for telemetry. With her frequent episodes I feel it is prudent to be sure she's not having episodes of marked bradycardia, or arrhythmias. While have her hold her blood pressure medications.   I personally performed the services described in this documentation, which was scribed in my presence. The recorded information has been reviewed and is accurate.    Roney Marion, MD 11/25/12 [phone]

## 2012-11-20 NOTE — H&P (Signed)
[facility] History and Physical  Jennifer [name] MVH:[id number] DOB: 1930/08/06 DOA: 11/20/2012  Referring physician:  PCP: Josue Hector, MD  Specialists:   Chief Complaint: dizziness/hypotension  HPI: Jennifer [name] is a delightful 79 y.o. female with a past medical history that includes hypertension, CAD, diabetes, Parkinson's, hyperlipidemia, presents to the emergency department from her primary care provider's office with a chief complaint of hypotension and dizziness. Information is obtained from the patient and her husband and daughter who are at the bedside. Patient reports a three-week history of intermittent "episodes". She describes these episodes as starting with a headache then she feels "stupid" and feels the need to lay down for fear she will fall down. Associated symptoms include shortness of breath, diaphoresis, nausea without vomiting. She denies any chest pain palpitations visual disturbances or syncopal events. She denies fever chills anorexia abdominal pain diarrhea. She does endorse a tendency towards constipation her last BM was yesterday. In addition she reports some dysuria and a sensation that she does not completely empty her bladder as well as frequency. She denies any hematuria, melena, hematochezia. She went to her primary care provider today for evaluation. At her primary care provider's office she was told her blood sugar was high, her blood pressure was low and her heart rate was decreased. She states that she has recently been taken off of her lisinopril and that she only took half a tablet of her beta blocker this morning. She was transported to the emergency room from the primary care provider's office. Initial workup in the emergency department significant for a creatinine of 1.24, urinalysis with many bacteria few squamous cells positive nitrites, EKG with normal sinus rhythm and PVCs. In the emergency room she was given a 500 cc bolus of normal saline.  She was hemodynamically stable in the emergency department. We are asked to admit for further evaluation to   Review of Systems: 10 point review of systems complete all systems are negative except as indicated in the history of present illness  Past Medical History  Diagnosis Date  . Hypertension   . Arteriosclerotic cardiovascular disease (ASCVD) 1994    1994-PCI to RCA and LAD; nonsustained ventricular tachycardia;cath in 11/06-30% left main, 50% proximal RCA, normal EF; Echo in 06/2010-mild LVH, hyperdynamic LV function  . Gastroesophageal reflux disease     Dysmotility, hiatal hernia; Schatzki's ring-dilated in 02/2010  . Diabetes mellitus, type 2   . UTI (lower urinary tract infection)   . 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 Anemia     Normocytic; with thrombocytopenia  . Parkinson disease   . Nephrolithiasis   . PSVT (paroxysmal supraventricular tachycardia) 2006    S/p RFA-2006  . Tobacco abuse, in remission     50 pack years  . Obesity   . TIA (transient ischemic attack)   . Hyperlipidemia    Past Surgical History  Procedure Laterality Date  . Kidney stone surgery    . Lumbar spine surgery  2007    L2-3 and L3-4 decompressive laminotomies and foraminotomies  . Radiofrequency ablation  2006    atrial tachycardia Dr.Gregg Ladona Ridgel  . Total abdominal hysterectomy    . Appendectomy     Social History:  reports that she quit smoking about 4 years ago. Her smoking use included Cigarettes. She has a 50 pack-year smoking history. She has never used smokeless tobacco. She reports that she does not drink alcohol or use illicit drugs. Patient is married lives  with her husband is retired. She uses a walker and a cane for ambulation due to her Parkinson's. She is independent with her ADLs  Allergies  Allergen Reactions  . Ace Inhibitors Other (See Comments)    Hyperkalemia-mild   . Codeine Nausea And Vomiting  . Penicillins Swelling  .  Ropinirole Hcl Other (See Comments)    Pt wasn't in right state of mind    Family History  Problem Relation Age of Onset  . Coronary artery disease Father   . Stroke Father   . Coronary artery disease Mother     also a sibling  . Heart disease Mother   . Heart disease Sister   . Heart disease Sister   . Heart disease Sister   . Heart disease Sister   . Heart disease Sister   . Heart disease Sister   . Heart disease Brother   . Heart disease Brother   . Heart disease Brother   . Heart disease Brother   . Heart disease Brother   . Healthy Daughter      Prior to Admission medications   Medication Sig Start Date End Date Taking? Authorizing Provider  acetaminophen (TYLENOL) 650 MG CR tablet Take 650 mg by mouth 2 (two) times daily as needed for pain.   Yes Historical Provider, MD  aspirin 81 MG tablet Take 81 mg by mouth daily.   Yes Historical Provider, MD  carbidopa-levodopa (SINEMET IR) 10-100 MG per tablet Take 1 tablet by mouth 2 (two) times daily.   Yes Historical Provider, MD  clopidogrel (PLAVIX) 75 MG tablet Take 75 mg by mouth daily.     Yes Historical Provider, MD  Coenzyme Q10 (CO Q-10 PO) Take 1 capsule by mouth daily.   Yes Historical Provider, MD  dexlansoprazole (DEXILANT) 60 MG capsule Take 60 mg by mouth daily.   Yes Historical Provider, MD  glyBURIDE-metformin (GLUCOVANCE) 2.5-500 MG per tablet Take 1 tablet by mouth daily with breakfast.   Yes Historical Provider, MD  levothyroxine (SYNTHROID, LEVOTHROID) 75 MCG tablet Take 75 mcg by mouth daily.    Yes Historical Provider, MD  metoprolol tartrate (LOPRESSOR) 25 MG tablet Take 25 mg by mouth daily.    Yes Historical Provider, MD  pramipexole (MIRAPEX) 0.125 MG tablet Take 0.125 mg by mouth 2 (two) times daily.    Yes Historical Provider, MD  Rasagiline Mesylate (AZILECT) 1 MG TABS Take 1 tablet by mouth daily.    Yes Historical Provider, MD  rosuvastatin (CRESTOR) 10 MG tablet Take 10 mg by mouth daily.   Yes  Historical Provider, MD   Physical Exam: Filed Vitals:   11/20/12 1300  BP: 163/73  Pulse: 74  Temp:   Resp:      General:  Well-nourished no acute distress  Eyes: PERR L., EOMI, no scleral icterus  ENT: Ears clear nose without drainage oropharynx without erythema or exudate. Mucous membranes of her mouth are pink slightly dry. Somewhat poor dentition  Neck: Supple no JVD full range of motion no lymphadenopathy  Cardiovascular: Regular rate and rhythm no murmur no gallop no rub no lower extremity edema. Fingernail beds slightly cyanotic hands cool to touch  Respiratory: Normal effort. Breath sounds slightly diminished in bilateral bases otherwise clear to auscultation bilaterally. No wheeze no rhonchi  Abdomen: Round soft nondistended nontender to palpation positive bowel sounds in all 4 quadrants.  Skin: Warm and dry no lesions noted no rash  Musculoskeletal: Moves all extremities. Fair muscle tone. Joints without swelling  or erythema  Psychiatric: Friendly cooperative calm  Neurologic:  Cranial nerves II through XII grossly intact speech clear facial symmetry  Labs on Admission:  Basic Metabolic Panel:  Recent Labs Lab 11/20/12 1129  NA 136  K 4.8  CL 103  CO2 26  GLUCOSE 77  BUN 22  CREATININE 1.24*  CALCIUM 9.6   Liver Function Tests: No results found for this basename: AST, ALT, ALKPHOS, BILITOT, PROT, ALBUMIN,  in the last 168 hours No results found for this basename: LIPASE, AMYLASE,  in the last 168 hours No results found for this basename: AMMONIA,  in the last 168 hours CBC:  Recent Labs Lab 11/20/12 1129  WBC 5.3  NEUTROABS 3.5  HGB 12.3  HCT 37.2  MCV 91.2  PLT 161   Cardiac Enzymes:  Recent Labs Lab 11/20/12 1159  TROPONINI <0.30    BNP (last 3 results) No results found for this basename: PROBNP,  in the last 8760 hours CBG: No results found for this basename: GLUCAP,  in the last 168 hours  Radiological Exams on  Admission: No results found.  EKG: Normal sinus rhythm with occasional PVC  Assessment/Plan Principal Problem:   Pre-syncope: Etiology uncertain. Patient reportedly hypotensive with bradycardia at primary care office today. May be multifactorial such as medications, mild dehydration, urinary tract infection, orthostatic hypotension. Will admit to telemetry for observation. Will cycle her cardiac enzymes and get a 2-D echo for completeness. Will check her TSH hemoglobin A1c. Will hold her antihypertensive medications and her beta blocker for now. Will gently hydrate with IV fluids. Check her orthostatics vital signs but note patient does have a history of same related to her Parkinson's. Active Problems:   UTI (lower urinary tract infection): Patient is afebrile with no leukocytosis. Will start Rocephin per pharmacy. Will send for urine culture. Will also check postvoid bladder scan for residual urine Acute renal failure    Acute renal failure: Mild. Likely related to mild dehydration and or use of ACE inhibitor. Will hold her ACE inhibitor with other antihypertensives 2 to problem #1. Provide gentle IV hydration and recheck in the morning.     Hypertension: Patient's hypotensive in her PCPs office today. She also has a history of orthostatic hypotension associated with her Parkinson's disease. Home medications include Lopressor and lisinopril. Will hold these for now. Will monitor closely    Parkinsonian syndrome with idiopathic orthostatic hypotension: Patient reports seeing her neurologist last month. Medication adjustments dosage reduced. May be playing a Part in problem #1. Monitor. Continue    Arteriosclerotic cardiovascular disease (ASCVD): No chest pain. Will continue aspirin. Will get a 2-D echo for completeness and cycle her cardiac enzymes.    Gastroesophageal reflux disease: We'll provide PPI.    Hypothyroid: Will check a TSH. Continue her Synthroid    Anemia: Appears stable at  baseline. No signs symptoms of obvious bleeding. Will monitor    Hyperlipidemia: Continue Crestor. Check a lipid panel     Code Status: Full Family Communication: Husband and daughter at bedside Disposition Plan: Home when ready  Time spent: 33 minutes  Gwenyth Bender [facility] Pager [phone]  If 7PM-7AM, please contact night-coverage [email or web address] Password [email or web address] 11/20/2012, 2:03 PM

## 2012-11-20 NOTE — Progress Notes (Signed)
ANTIBIOTIC CONSULT NOTE - INITIAL  Pharmacy Consult for Cipro Indication: UTI  Allergies  Allergen Reactions  . Ace Inhibitors Other (See Comments)    Hyperkalemia-mild   . Codeine Nausea And Vomiting  . Penicillins Swelling  . Ropinirole Hcl Other (See Comments)    Pt wasn't in right state of mind    Patient Measurements: Height: 5\' 3"  (160 cm) Weight: 150 lb (68.04 kg) IBW/kg (Calculated) : 52.4  Vital Signs: Temp: 98 F (36.7 C) (10/09 1440) Temp src: Oral (10/09 1440) BP: 132/109 mmHg (10/09 1440) Pulse Rate: 77 (10/09 1440) Intake/Output from previous day:   Intake/Output from this shift:    Labs:  Recent Labs  11/20/12 1129  WBC 5.3  HGB 12.3  PLT 161  CREATININE 1.24*   Estimated Creatinine Clearance: 33.5 ml/min (by C-G formula based on Cr of 1.24). No results found for this basename: VANCOTROUGH, VANCOPEAK, VANCORANDOM, GENTTROUGH, GENTPEAK, GENTRANDOM, TOBRATROUGH, TOBRAPEAK, TOBRARND, AMIKACINPEAK, AMIKACINTROU, AMIKACIN,  in the last 72 hours   Microbiology: No results found for this or any previous visit (from the past 720 hour(s)).  Medical History: Past Medical History  Diagnosis Date  . Hypertension   . Arteriosclerotic cardiovascular disease (ASCVD) 1994    1994-PCI to RCA and LAD; nonsustained ventricular tachycardia;cath in 11/06-30% left main, 50% proximal RCA, normal EF; Echo in 06/2010-mild LVH, hyperdynamic LV function  . Gastroesophageal reflux disease     Dysmotility, hiatal hernia; Schatzki's ring-dilated in 02/2010  . Diabetes mellitus, type 2   . UTI (lower urinary tract infection)   . 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 Anemia     Normocytic; with thrombocytopenia  . Parkinson disease   . Nephrolithiasis   . PSVT (paroxysmal supraventricular tachycardia) 2006    S/p RFA-2006  . Tobacco abuse, in remission     50 pack years  . Obesity   . TIA (transient ischemic attack)   . Hyperlipidemia      Medications:  Scheduled:  . aspirin  81 mg Oral Daily  . [START ON 11/21/2012] atorvastatin  10 mg Oral q1800  . carbidopa-levodopa  1 tablet Oral BID PC  . ciprofloxacin  200 mg Intravenous Q12H  . [START ON 11/21/2012] clopidogrel  75 mg Oral Daily  . docusate sodium  100 mg Oral BID  . enoxaparin (LOVENOX) injection  40 mg Subcutaneous Q24H  . insulin aspart  0-15 Units Subcutaneous TID WC  . insulin aspart  0-5 Units Subcutaneous QHS  . [START ON 11/21/2012] levothyroxine  75 mcg Oral QAC breakfast  . pramipexole  0.125 mg Oral BID   Assessment: 79 yo F admitted with syncope & possible UTI.  She is afebrile with normal WBC.   Of note, she has PCN allergy = swelling.  Unclear if she has ever had cephalosporin in the past so discussed therapy with Toya Smothers, NP and will switch her to Cipro.  She has previously taken & tolerated this medication.  Acute renal function noted.    Goal of Therapy:  Eradicate infection.  Plan:  Cipro 200mg  IV q12h Monitor renal function and cx data   Elson Clan 11/20/2012,3:24 PM

## 2012-11-21 DIAGNOSIS — I951 Orthostatic hypotension: Secondary | ICD-10-CM

## 2012-11-21 DIAGNOSIS — I369 Nonrheumatic tricuspid valve disorder, unspecified: Secondary | ICD-10-CM

## 2012-11-21 DIAGNOSIS — E039 Hypothyroidism, unspecified: Secondary | ICD-10-CM

## 2012-11-21 LAB — GLUCOSE, CAPILLARY
Glucose-Capillary: 113 mg/dL — ABNORMAL HIGH (ref 70–99)
Glucose-Capillary: 50 mg/dL — ABNORMAL LOW (ref 70–99)
Glucose-Capillary: 52 mg/dL — ABNORMAL LOW (ref 70–99)

## 2012-11-21 LAB — BASIC METABOLIC PANEL
BUN: 19 mg/dL (ref 6–23)
Calcium: 9.3 mg/dL (ref 8.4–10.5)
Chloride: 107 mEq/L (ref 96–112)
Creatinine, Ser: 1.17 mg/dL — ABNORMAL HIGH (ref 0.50–1.10)
GFR calc Af Amer: 50 mL/min — ABNORMAL LOW (ref >90)
GFR calc non Af Amer: 43 mL/min — ABNORMAL LOW (ref >90)
Glucose, Bld: 104 mg/dL — ABNORMAL HIGH (ref 70–99)
Potassium: 4.4 mEq/L (ref 3.5–5.1)
Sodium: 140 mEq/L (ref 135–145)

## 2012-11-21 LAB — TROPONIN I: Troponin I: <0.3 ng/mL (ref <0.30)

## 2012-11-21 LAB — HEMOGLOBIN A1C
Hgb A1c MFr Bld: 6.9 % — ABNORMAL HIGH (ref <5.7)
Mean Plasma Glucose: 151 mg/dL — ABNORMAL HIGH (ref <117)

## 2012-11-21 MED ORDER — CIPROFLOXACIN HCL 500 MG PO TABS: 500.0000 mg | ORAL_TABLET | Freq: Two times a day (BID) | ORAL | Status: DC

## 2012-11-21 MED ORDER — LEVOTHYROXINE SODIUM 50 MCG PO TABS: 50.0000 ug | ORAL_TABLET | Freq: Every day | ORAL | Status: DC

## 2012-11-21 NOTE — Evaluation (Signed)
Physical Therapy Evaluation Patient Details Name: Jennifer [name] MRN: [id number] DOB: March 29, 1930 Today's Date: 11/21/2012 Time: 6213-0865 PT Time Calculation (min): 49 min  PT Assessment / Plan / Recommendation History of Present Illness  Pt with a hx of Parkinson's Disease has been experiencing intermittent light headedness and is admitted for evaluation.  She lives with husband and is failrly independent with ADLs, uses a walker for gait due to DJD of the spine.  She has had back surgery and has significant scoliosis      Clinical Impression   Pt was seen for evaluation and found to be at functional baseline.  She had no reports of dizziness in the upright position and her gait is stable with a walker for functional distances.  She has documented orthostatic hypotension by Chart review and I asked her if she ever wore support stockings....she said that she has, but that they never really helped her.  Her mobility is quite limited by her back dysfunction and her Parkinson's sx seem to be well controlled.  She is probably at her maximal functional potential.    PT Assessment  Patent does not need any further PT services    Follow Up Recommendations  No PT follow up    Does the patient have the potential to tolerate intense rehabilitation      Barriers to Discharge        Equipment Recommendations  None recommended by PT    Recommendations for Other Services     Frequency      Precautions / Restrictions Precautions Precautions: Fall Restrictions Weight Bearing Restrictions: No   Pertinent Vitals/Pain       Mobility  Bed Mobility Bed Mobility: Sit to Supine Supine to Sit: 6: Modified independent (Device/Increase time);HOB flat Sit to Supine: 6: Modified independent (Device/Increase time);HOB flat Transfers Transfers: Sit to Stand;Stand to Sit Sit to Stand: 6: Modified independent (Device/Increase time);From bed;With upper extremity assist Stand to Sit: 6: Modified  independent (Device/Increase time);To chair/3-in-1;With upper extremity assist Ambulation/Gait Ambulation/Gait Assistance: 6: Modified independent (Device/Increase time) Ambulation Distance (Feet): 175 Feet Assistive device: Rolling walker Gait Pattern: Trunk flexed;Decreased step length - right;Decreased stance time - right;Decreased hip/knee flexion - right Gait velocity: WNL General Gait Details: gait with walker is stable Stairs: No Wheelchair Mobility Wheelchair Mobility: No    Exercises     PT Diagnosis:    PT Problem List:   PT Treatment Interventions:       PT Goals(Current goals can be found in the care plan section) Acute Rehab PT Goals PT Goal Formulation: No goals set, d/c therapy  Visit Information  Last PT Received On: 11/21/12 History of Present Illness: Pt with a hx of Parkinson's Disease has been experiencing intermittent light headedness and is admitted for evaluation.  She lives with husband and is failrly independent with ADLs, uses a walker for gait due to DJD of the spine.  She has had back surgery and has significant scoliosis           Prior Functioning  Home Living Family/patient expects to be discharged to:: Private residence Living Arrangements: Spouse/significant other Available Help at Discharge: Family;Available PRN/intermittently Type of Home: House Home Access: Stairs to enter [employer] of Steps: 5 Entrance Stairs-Rails: Right;Left;Can reach both Home Layout: One level Home Equipment: Walker - 2 wheels;Walker - standard;Cane - single point;Shower seat;Hand held shower head Prior Function Level of Independence: Independent with assistive device(s) Communication Communication: No difficulties    Cognition  Cognition Arousal/Alertness: Awake/alert  Behavior During Therapy: WFL for tasks assessed/performed Overall Cognitive Status: Within Functional Limits for tasks assessed    Extremity/Trunk Assessment Lower Extremity  Assessment Lower Extremity Assessment: Overall WFL for tasks assessed   Balance Balance Balance Assessed: Yes Dynamic Standing Balance Dynamic Standing - Balance Support: Bilateral upper extremity supported Dynamic Standing - Level of Assistance: 6: Modified independent (Device/Increase time)  End of Session PT - End of Session Equipment Utilized During Treatment: Gait belt Activity Tolerance: Patient tolerated treatment well Patient left: in bed;with family/visitor present;with bed alarm set  GP Functional Assessment Tool Used: clinical judgement Functional Limitation: Mobility: Walking and moving around Mobility: Walking and Moving Around Current Status ([id number]): At least 1 percent but less than 20 percent impaired, limited or restricted Mobility: Walking and Moving Around Goal Status [phone]): At least 1 percent but less than 20 percent impaired, limited or restricted Mobility: Walking and Moving Around Discharge Status [phone]): At least 1 percent but less than 20 percent impaired, limited or restricted   Konrad Penta 11/21/2012, 12:15 PM

## 2012-11-21 NOTE — Progress Notes (Signed)
UR chart review completed.  

## 2012-11-21 NOTE — Progress Notes (Signed)
Pt and her husband verbalize understanding of d/c instructions, mediation changes and follow up appts. Pts IV was d/c without complications. PT d/c via wheelchair accompanied by NT. Pt has necessary prescriptions.Sheryn Bison

## 2012-11-21 NOTE — Progress Notes (Signed)
[facility]   2D echo completed 11/21/2012.   Cindy Lamisha Roussell, RDCS  

## 2012-11-21 NOTE — Progress Notes (Signed)
Pt and her husband verbalize understanding of d/c instructions, new meds and medication changes, and follow up appts. IV was d/c without complications. D/c via wheelchair accompanied by NT and her husband. Sheryn Bison

## 2012-11-21 NOTE — Discharge Summary (Signed)
Physician Discharge Summary  [name]:[id number] DOB: [birth year] DOA: 11/20/2012  PCP: Josue Hector, MD  Admit date: 11/20/2012 Discharge date: 11/21/2012  Time spent: 40 minutes  Recommendations for Outpatient Follow-up:  1. Call PCP for follow up appointment for 7-10 days for evaluation of symptoms. Evaluation of CBG readings as diabetes medications discontinued at discharge. Evaluation of BP as medications discontinued at discharge. Follow free T3 and Free T4.   2. Appointment with Dr. Negley Lions for evaluation of symptoms. May benefit from holter monitoring.   Discharge Diagnoses:  Principal Problem:   Pre-syncope Active Problems:   Hypertension   Parkinsonian syndrome with idiopathic orthostatic hypotension   Arteriosclerotic cardiovascular disease (ASCVD)   Gastroesophageal reflux disease   Hypothyroid   Anemia   Hyperlipidemia   Acute renal failure   UTI (lower urinary tract infection)   Discharge Condition: stable  Diet recommendation: carb modified  Filed Weights   11/20/12 1113  Weight: 68.04 kg (150 lb)    History of present illness:  Jennifer [name] is a delightful 79 y.o. female with a past medical history that includes hypertension, CAD, diabetes, Parkinson's, hyperlipidemia, presented to the emergency department on 11/20/12 from her primary care provider's office with a chief complaint of hypotension and dizziness. Patient reported a three-week history of intermittent "episodes". She described these episodes as starting with a headache then she felt "stupid" and feels the need to lay down for fear she will fall down. Associated symptoms include shortness of breath, diaphoresis, nausea without vomiting. She denied any chest pain palpitations visual disturbances or syncopal events. She denied fever chills anorexia abdominal pain diarrhea. She did endorse a tendency towards constipation her last BM was 11/19/12. In addition she reported some dysuria and a  sensation that she does not completely empty her bladder as well as frequency. She denied any hematuria, melena, hematochezia. She went to her primary care provider  for evaluation. At her primary care provider's office she was told her blood sugar was high, her blood pressure was low and her heart rate was decreased. She stated that she had recently been taken off of her lisinopril and that she only took half a tablet of her beta blocker. She was transported to the emergency room from the primary care provider's office. Initial workup in the emergency department significant for a creatinine of 1.24, urinalysis with many bacteria few squamous cells positive nitrites, EKG with normal sinus rhythm and PVCs. In the emergency room she was given a 500 cc bolus of normal saline. She was hemodynamically stable in the emergency department.   Hospital Course:  Pre-syncope:  Patient reportedly hypotensive with bradycardia and hyperglycemia at primary care office. May be multifactorial such as medications, mild dehydration, urinary tract infection, orthostatic hypotension due to parkinsons. No events on telemetry. Cardiac enzymes negative. 2-D echo unremarkable. TSH 0.219. Free T3 and Free T4 pending at discharge. Will decrease synthroid at discharge. hemoglobin A1c 6.9. Orthostatics vital signs within the limits of normal. Lopressor held during this hospitalization.  HR range 77-84. SBP range 102-160. Will continue to hold at discharge until evaluated by PCP in 7-10 days. At discharge BP 123/60 HR 80. CBG 115  Diabetes with hypoglycemia.  Patient was taking glyburide/metformin combo prior to admission.  She was noted to have significant hypoglycemia while in the hospital.  This will be held until re-evaluated by primary care doctor.  UTI (lower urinary tract infection): Patient remained afebrile with no leukocytosis. Given Rocephin x2. wil discharge with 5  days of cipro to complete 7 day course. Urine culture currently  growing gram negative rods.     Acute renal failure: Mild. Likely related to mild dehydration and or use of ACE inhibitor. Gentle IV fluids given. Resolved at discharge.     Hypertension: Patient's hypotensive in her PCPs office today. She also has a history of orthostatic hypotension associated with her Parkinson's disease. Home medications include Lopressor. This was held and will continue to be held at discharge. See #1. Blood pressure now stable.   Parkinsonian syndrome with idiopathic orthostatic hypotension: Patient reports seeing her neurologist last month. Stable   Arteriosclerotic cardiovascular disease (ASCVD): No chest pain. Will continue aspirin. Echo results unremarkable.    Gastroesophageal reflux disease: Stable  Hypothyroid: TSH 0.219. Synthroid decreased to . Free T3 and free T4 to be followed OP. Repeat TSH in 4 weeks   Anemia: remained stable at baseline. No signs symptoms of obvious bleeding.   Hyperlipidemia: Continue Crestor.     Procedures: Echo: - Left ventricle: The cavity size was normal. Wall thickness was increased in a pattern of mild LVH. There was focal basal hypertrophy with dynamic obstruction. Systolic function was vigorous. The estimated ejection fraction was >70%. There is a mild intracavitary peak gradient of 25 mmHg due to systolic cavity obliteration. This gradient increases to 51 mmHg with valsalva. Doppler parameters are consistent with abnormal left ventricular relaxation (grade 1 diastolic dysfunction). - Aortic valve: Mildly to moderately calcified annulus. Probably trileaflet; mildly thickened leaflets. Valve area: 1.51cm^2(VTI). Valve area: 1.36cm^2 (Vmax). - Left atrium: The atrium was mildly dilated. - Right ventricle: The cavity size was mildly dilated. - Right atrium: The atrium was mildly dilated. - Pulmonary arteries: PA peak pressure: 35mm Hg (S). - Systemic veins: The IVC is collapsed, consistent with low RA  pressures/hypovolemia.   Consultations:  none  Discharge Exam: Filed Vitals:   11/21/12 0512  BP: 137/47  Pulse:   Temp:   Resp:     General: well nourished NAD Cardiovascular: RRR No MGR No LE edema PPP Respiratory: normal effort BS clear bilaterally no wheeze Abdomen soft +BS non-tender to palpation  Discharge Instructions      Discharge Orders   Future Appointments Provider Department Dept Phone   12/24/2012 3:30 PM Rollene Rotunda, MD [facility] [phone]   Future Orders Complete By Expires   Diet - low sodium heart healthy  As directed    Discharge instructions  As directed    Comments:     Take medication as directed Monitor CBG 3 times daily. If get reading of 300 or greater 3 times in a row, call MD. Document all readings with time and date and take to PCP for evaluation. Call PCP for appointment for 7-10 days   Increase activity slowly  As directed        Medication List    STOP taking these medications       glyBURIDE-metformin 2.5-500 MG per tablet  Commonly known as:  GLUCOVANCE     metoprolol tartrate 25 MG tablet  Commonly known as:  LOPRESSOR      TAKE these medications       acetaminophen 650 MG CR tablet  Commonly known as:  TYLENOL  Take 650 mg by mouth 2 (two) times daily as needed for pain.     aspirin 81 MG tablet  Take 81 mg by mouth daily.     AZILECT 1 MG Tabs tablet  Generic drug:  rasagiline  Take 1  tablet by mouth daily.     carbidopa-levodopa 10-100 MG per tablet  Commonly known as:  SINEMET IR  Take 1-2 tablets by mouth 2 (two) times daily. Takes 1 tablet in the morning & 2 after supper.     ciprofloxacin 500 MG tablet  Commonly known as:  CIPRO  Take 1 tablet (500 mg total) by mouth 2 (two) times daily.     clopidogrel 75 MG tablet  Commonly known as:  PLAVIX  Take 75 mg by mouth daily.     CO Q-10 PO  Take 1 capsule by mouth daily.     DEXILANT 60 MG capsule  Generic drug:  dexlansoprazole   Take 60 mg by mouth daily.     levothyroxine 50 MCG tablet  Commonly known as:  SYNTHROID  Take 1 tablet (50 mcg total) by mouth daily before breakfast.     pramipexole 0.125 MG tablet  Commonly known as:  MIRAPEX  Take 0.125 mg by mouth 2 (two) times daily.     rosuvastatin 10 MG tablet  Commonly known as:  CRESTOR  Take 10 mg by mouth daily.       Allergies  Allergen Reactions  . Ace Inhibitors Other (See Comments)    Hyperkalemia-mild   . Codeine Nausea And Vomiting  . Penicillins Swelling  . Ropinirole Hcl Other (See Comments)    Pt wasn't in right state of mind   Follow-up Information   Follow up with Josue Hector, MD. Schedule an appointment as soon as possible for a visit in 1 week. (evaluation of symptoms)    Specialty:  Family Medicine   Contact information:   [address] [place] [address] [phone]       Follow up with Rollene Rotunda, MD On 12/24/2012. (appointment at 3:30pm in [facility])    Specialty:  Cardiology   Contact information:   [address] [place] [address] [phone]        The results of significant diagnostics from this hospitalization (including imaging, microbiology, ancillary and laboratory) are listed below for reference.    Significant Diagnostic Studies: No results found.  Microbiology: Recent Results (from the past 240 hour(s))  URINE CULTURE     Status: None   Collection Time    11/20/12 12:54 PM      Result Value Range Status   Specimen Description URINE, CLEAN CATCH   Final   Special Requests NONE   Final   Culture  Setup Time     Final   Value: 11/20/2012 18:20     Performed at [facility] Count     Final   Value: >=100,000 COLONIES/ML     Performed at [facility]   Culture     Final   Value: GRAM NEGATIVE RODS     Performed at [facility]   Report Status PENDING   Incomplete     Labs: Basic Metabolic  Panel:  Recent Labs Lab 11/20/12 1129 11/21/12 0453  NA 136 140  K 4.8 4.4  CL 103 107  CO2 26 24  GLUCOSE 77 104*  BUN 22 19  CREATININE 1.24* 1.17*  CALCIUM 9.6 9.3   Liver Function Tests: No results found for this basename: AST, ALT, ALKPHOS, BILITOT, PROT, ALBUMIN,  in the last 168 hours No results found for this basename: LIPASE, AMYLASE,  in the last 168 hours No results found for this basename: AMMONIA,  in the  last 168 hours CBC:  Recent Labs Lab 11/20/12 1129  WBC 5.3  NEUTROABS 3.5  HGB 12.3  HCT 37.2  MCV 91.2  PLT 161   Cardiac Enzymes:  Recent Labs Lab 11/20/12 1159 11/20/12 1755 11/20/12 2320  TROPONINI <0.30 <0.30 <0.30   BNP: BNP (last 3 results) No results found for this basename: PROBNP,  in the last 8760 hours CBG:  Recent Labs Lab 11/21/12 0010 11/21/12 0128 11/21/12 0805 11/21/12 0842 11/21/12 1203  GLUCAP 50* 160* 52* 156* 119*       Signed:  Toya Smothers M  [facility] 11/21/2012, 1:22 PM   Attending note:   Patient seen and examined.  Agree with note as above per Toya Smothers, NP.  Patient was admitted with lightheadedness, hypotension, hypoglycemia.  She was noted to be clinically dehydrated, found to have a UTI. She was given IV fluids, antibiotics.  Beta blockers were held and blood pressures are stable.  Oral hypoglycemics were also held and she has been advised to check her blood sugar frequently.  She feels significantly improved, is ambulating without difficulty and is ready for discharge home.  She has been referred to follow up with her cardiologist for any further work up, and will also follow up with her primary care doctor.  MEMON,JEHANZEB

## 2012-11-22 LAB — URINE CULTURE: Colony Count: >=100000

## 2012-11-22 LAB — T4, FREE: Free T4: 1.4 ng/dL (ref 0.80–1.80)

## 2012-12-15 ENCOUNTER — Emergency Department (HOSPITAL_COMMUNITY)
Admission: EM | Admit: 2012-12-15 | Discharge: 2012-12-15 | Disposition: A | Discharge status: Home / Self Care | Payer: Medicare Other | Attending: Emergency Medicine | Admitting: Emergency Medicine

## 2012-12-15 ENCOUNTER — Emergency Department (HOSPITAL_COMMUNITY): Payer: Medicare Other

## 2012-12-15 ENCOUNTER — Encounter (HOSPITAL_COMMUNITY): Payer: Self-pay | Admitting: Emergency Medicine

## 2012-12-15 DIAGNOSIS — S0003XA Contusion of scalp, initial encounter: Secondary | ICD-10-CM | POA: Insufficient documentation

## 2012-12-15 DIAGNOSIS — Z23 Encounter for immunization: Secondary | ICD-10-CM | POA: Insufficient documentation

## 2012-12-15 DIAGNOSIS — Y9389 Activity, other specified: Secondary | ICD-10-CM | POA: Insufficient documentation

## 2012-12-15 DIAGNOSIS — T148XXA Other injury of unspecified body region, initial encounter: Secondary | ICD-10-CM

## 2012-12-15 DIAGNOSIS — W010XXA Fall on same level from slipping, tripping and stumbling without subsequent striking against object, initial encounter: Secondary | ICD-10-CM | POA: Insufficient documentation

## 2012-12-15 DIAGNOSIS — E119 Type 2 diabetes mellitus without complications: Secondary | ICD-10-CM | POA: Insufficient documentation

## 2012-12-15 DIAGNOSIS — Z87891 Personal history of nicotine dependence: Secondary | ICD-10-CM | POA: Insufficient documentation

## 2012-12-15 DIAGNOSIS — Z862 Personal history of diseases of the blood and blood-forming organs and certain disorders involving the immune mechanism: Secondary | ICD-10-CM | POA: Insufficient documentation

## 2012-12-15 DIAGNOSIS — E785 Hyperlipidemia, unspecified: Secondary | ICD-10-CM | POA: Insufficient documentation

## 2012-12-15 DIAGNOSIS — Z8673 Personal history of transient ischemic attack (TIA), and cerebral infarction without residual deficits: Secondary | ICD-10-CM | POA: Insufficient documentation

## 2012-12-15 DIAGNOSIS — Y92009 Unspecified place in unspecified non-institutional (private) residence as the place of occurrence of the external cause: Secondary | ICD-10-CM | POA: Insufficient documentation

## 2012-12-15 DIAGNOSIS — IMO0002 Reserved for concepts with insufficient information to code with codable children: Secondary | ICD-10-CM | POA: Insufficient documentation

## 2012-12-15 DIAGNOSIS — Z7902 Long term (current) use of antithrombotics/antiplatelets: Secondary | ICD-10-CM | POA: Insufficient documentation

## 2012-12-15 DIAGNOSIS — Z87442 Personal history of urinary calculi: Secondary | ICD-10-CM | POA: Insufficient documentation

## 2012-12-15 DIAGNOSIS — Z7982 Long term (current) use of aspirin: Secondary | ICD-10-CM | POA: Insufficient documentation

## 2012-12-15 DIAGNOSIS — G20A1 Parkinson's disease without dyskinesia, without mention of fluctuations: Secondary | ICD-10-CM | POA: Insufficient documentation

## 2012-12-15 DIAGNOSIS — G20 Parkinson's disease: Secondary | ICD-10-CM | POA: Insufficient documentation

## 2012-12-15 DIAGNOSIS — Z880 Allergy status to penicillin: Secondary | ICD-10-CM | POA: Insufficient documentation

## 2012-12-15 DIAGNOSIS — S8990XA Unspecified injury of unspecified lower leg, initial encounter: Secondary | ICD-10-CM | POA: Insufficient documentation

## 2012-12-15 DIAGNOSIS — E669 Obesity, unspecified: Secondary | ICD-10-CM | POA: Insufficient documentation

## 2012-12-15 DIAGNOSIS — Z79899 Other long term (current) drug therapy: Secondary | ICD-10-CM | POA: Insufficient documentation

## 2012-12-15 DIAGNOSIS — E039 Hypothyroidism, unspecified: Secondary | ICD-10-CM | POA: Insufficient documentation

## 2012-12-15 DIAGNOSIS — W1809XA Striking against other object with subsequent fall, initial encounter: Secondary | ICD-10-CM | POA: Insufficient documentation

## 2012-12-15 DIAGNOSIS — Z87440 Personal history of urinary (tract) infections: Secondary | ICD-10-CM | POA: Insufficient documentation

## 2012-12-15 DIAGNOSIS — K219 Gastro-esophageal reflux disease without esophagitis: Secondary | ICD-10-CM | POA: Insufficient documentation

## 2012-12-15 LAB — GLUCOSE, CAPILLARY: Glucose-Capillary: 156 mg/dL — ABNORMAL HIGH (ref 70–99)

## 2012-12-15 MED ORDER — TETANUS-DIPHTH-ACELL PERTUSSIS 5-2.5-18.5 LF-MCG/0.5 IM SUSP
0.5000 mL | Freq: Once | INTRAMUSCULAR | Status: AC
  Administered 2012-12-15: 0.5 mL via INTRAMUSCULAR
  Filled 2012-12-15: qty 0.5

## 2012-12-15 MED ORDER — HYDROCODONE-ACETAMINOPHEN 5-325 MG PO TABS: 0.5000 | ORAL_TABLET | ORAL | Status: DC | PRN

## 2012-12-15 NOTE — ED Notes (Signed)
Patient given discharge instruction, verbalized understand.  Patient in wheelchair out of the department.  

## 2012-12-15 NOTE — ED Provider Notes (Signed)
CSN: [id number]     Arrival date & time 12/15/12  1602 History   First MD Initiated Contact with Patient 12/15/12 1719     Chief Complaint  Patient presents with  . Fall   (Consider location/radiation/quality/duration/timing/severity/associated sxs/prior Treatment) HPI Comments: Patient presents to the ER for evaluation after a fall. Patient reports that she had went to answer the door, like someone into her house and when she turned to walk away, stumbled and fell. She dated her face on the concrete floor. There was no loss of consciousness. The patient complains of pain and swelling of the right side of her face. She also is complaining of pain in the left knee. She denies chest pain, shortness of breath, or palpitations. There was no syncope and no loss of consciousness after she had her head. She does take Plavix. Patient denies neck or back pain.   Past Medical History  Diagnosis Date  . Hypertension   . Arteriosclerotic cardiovascular disease (ASCVD) 1994    1994-PCI to RCA and LAD; nonsustained ventricular tachycardia;cath in 11/06-30% left main, 50% proximal RCA, normal EF; Echo in 06/2010-mild LVH, hyperdynamic LV function  . Gastroesophageal reflux disease     Dysmotility, hiatal hernia; Schatzki's ring-dilated in 02/2010  . Diabetes mellitus, type 2   . UTI (lower urinary tract infection)   . 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 Anemia     Normocytic; with thrombocytopenia  . Parkinson disease   . Nephrolithiasis   . PSVT (paroxysmal supraventricular tachycardia) 2006    S/p RFA-2006  . Tobacco abuse, in remission     50 pack years  . Obesity   . TIA (transient ischemic attack)   . Hyperlipidemia    Past Surgical History  Procedure Laterality Date  . Kidney stone surgery    . Lumbar spine surgery  2007    L2-3 and L3-4 decompressive laminotomies and foraminotomies  . Radiofrequency ablation  2006    atrial tachycardia Dr.Gregg Ladona Ridgel    . Total abdominal hysterectomy    . Appendectomy     Family History  Problem Relation Age of Onset  . Coronary artery disease Father   . Stroke Father   . Coronary artery disease Mother     also a sibling  . Heart disease Mother   . Heart disease Sister   . Heart disease Sister   . Heart disease Sister   . Heart disease Sister   . Heart disease Sister   . Heart disease Sister   . Heart disease Brother   . Heart disease Brother   . Heart disease Brother   . Heart disease Brother   . Heart disease Brother   . Healthy Daughter    History  Substance Use Topics  . Smoking status: Former Smoker -- 1.00 packs/day for 50 years    Types: Cigarettes    Quit date: 05/14/2008  . Smokeless tobacco: Never Used     Comment: Patient smokes 1 to 1 1/2 a day  . Alcohol Use: No   OB History   Grav Para Term Preterm Abortions TAB SAB Ect Mult Living                 Review of Systems  Respiratory: Negative.   Cardiovascular: Negative.   Musculoskeletal: Positive for arthralgias (left knee). Negative for back pain and neck pain.  Skin: Positive for wound.  Neurological: Positive for headaches. Negative for dizziness, syncope and numbness.  All  other systems reviewed and are negative.    Allergies  Ace inhibitors; Codeine; Penicillins; and Ropinirole hcl  Home Medications   Current Outpatient Rx  Name  Route  Sig  Dispense  Refill  . acetaminophen (TYLENOL) 650 MG CR tablet   Oral   Take 650 mg by mouth 2 (two) times daily as needed for pain.         Marland Kitchen aspirin 81 MG tablet   Oral   Take 81 mg by mouth every morning.          . carbidopa-levodopa (SINEMET IR) 10-100 MG per tablet   Oral   Take 1-2 tablets by mouth 2 (two) times daily. Takes 1 tablet in the morning & 2 after supper.         . clopidogrel (PLAVIX) 75 MG tablet   Oral   Take 75 mg by mouth at bedtime.          . Coenzyme Q10 (CO Q-10 PO)   Oral   Take 1 capsule by mouth daily.         Marland Kitchen  dexlansoprazole (DEXILANT) 60 MG capsule   Oral   Take 60 mg by mouth daily.         Marland Kitchen levothyroxine (SYNTHROID) 50 MCG tablet   Oral   Take 1 tablet (50 mcg total) by mouth daily before breakfast.   30 tablet   0   . pramipexole (MIRAPEX) 0.125 MG tablet   Oral   Take 0.125 mg by mouth 2 (two) times daily.          . Rasagiline Mesylate (AZILECT) 1 MG TABS   Oral   Take 1 tablet by mouth daily.          . rosuvastatin (CRESTOR) 10 MG tablet   Oral   Take 10 mg by mouth daily.          BP 152/50  Pulse 96  Temp(Src) 97.7 F (36.5 C) (Oral)  Resp 20  Ht 5\' 2"  (1.575 m)  SpO2 100% Physical Exam  Constitutional: She is oriented to person, place, and time. She appears well-developed and well-nourished. No distress.  HENT:  Head: Normocephalic.    Right Ear: Hearing normal.  Left Ear: Hearing normal.  Nose: Nose normal. No nasal deformity or nasal septal hematoma.  Mouth/Throat: Oropharynx is clear and moist and mucous membranes are normal.  Eyes: Conjunctivae and EOM are normal. Pupils are equal, round, and reactive to light.  Neck: Normal range of motion. Neck supple. No spinous process tenderness and no muscular tenderness present.  Cardiovascular: Regular rhythm, S1 normal and S2 normal.  Exam reveals no gallop and no friction rub.   No murmur heard. Pulmonary/Chest: Effort normal and breath sounds normal. No respiratory distress. She exhibits no tenderness.  Abdominal: Soft. Normal appearance and bowel sounds are normal. There is no hepatosplenomegaly. There is no tenderness. There is no rebound, no guarding, no tenderness at McBurney's point and negative Murphy's sign. No hernia.  Musculoskeletal: Normal range of motion.       Left knee: She exhibits swelling.       Cervical back: Normal.       Thoracic back: Normal.       Lumbar back: Normal.       Legs: Neurological: She is alert and oriented to person, place, and time. She has normal strength. No  cranial nerve deficit or sensory deficit. Coordination normal. GCS eye subscore is 4. GCS verbal subscore is 5.  GCS motor subscore is 6.  Skin: Skin is warm, dry and intact. No rash noted. No cyanosis.  Psychiatric: She has a normal mood and affect. Her speech is normal and behavior is normal. Thought content normal.    ED Course  Procedures (including critical care time) Labs Review Labs Reviewed  GLUCOSE, CAPILLARY - Abnormal; Notable for the following:    Glucose-Capillary 156 (*)    All other components within normal limits   Imaging Review Ct Head Wo Contrast  12/15/2012   CLINICAL DATA:  Fall. Pain.  EXAM: CT HEAD WITHOUT CONTRAST  CT MAXILLOFACIAL WITHOUT CONTRAST  TECHNIQUE: Multidetector CT imaging of the head and maxillofacial structures were performed using the standard protocol without intravenous contrast. Multiplanar CT image reconstructions of the maxillofacial structures were also generated.  COMPARISON:  None.  FINDINGS: CT HEAD FINDINGS  Ventricles are normal and size and configuration.  There are no parenchymal masses or mass effect. Patchy white matter hypoattenuation is noted along the periventricular white matter consistent with mild chronic microvascular ischemic change. There is no evidence of a recent cortical infarct.  There are no extra-axial masses or abnormal fluid collections.  There is no intracranial hemorrhage.  No skull fracture is seen. The sinuses and mastoid air cells are clear.  CT MAXILLOFACIAL FINDINGS  No fracture. There is right sided preseptal periorbital soft tissue swelling extending along the subcutaneous soft tissues of the right cheek and right margin of the mandible. The postseptal orbit on the right is unremarkable. The right globe is intact. There are changes from previous right cataract surgery.  The left globe and orbit are unremarkable. The sinuses and mastoid air cells and middle ear cavities are clear.  There are no soft tissue masses or enlarged  lymph nodes.  IMPRESSION: Head CT:  No acute intracranial abnormality.  Maxillofacial CT: No fracture. Right preseptal periorbital, right cheek and right mandible region soft tissue swelling.   Electronically Signed   By: Amie Portland M.D.   On: 12/15/2012 17:16   Dg Knee Complete 4 Views Left  12/15/2012   CLINICAL DATA:  Larey Seat.  Knee pain.  EXAM: LEFT KNEE - COMPLETE 4+ VIEW  COMPARISON:  None.  FINDINGS: The joint spaces are maintained. Minimal degenerative changes. No acute fracture or osteochondral lesion. No joint effusion.  IMPRESSION: No acute bony findings.   Electronically Signed   By: Loralie Champagne M.D.   On: 12/15/2012 17:16   Ct Maxillofacial Wo Cm  12/15/2012   CLINICAL DATA:  Fall. Pain.  EXAM: CT HEAD WITHOUT CONTRAST  CT MAXILLOFACIAL WITHOUT CONTRAST  TECHNIQUE: Multidetector CT imaging of the head and maxillofacial structures were performed using the standard protocol without intravenous contrast. Multiplanar CT image reconstructions of the maxillofacial structures were also generated.  COMPARISON:  None.  FINDINGS: CT HEAD FINDINGS  Ventricles are normal and size and configuration.  There are no parenchymal masses or mass effect. Patchy white matter hypoattenuation is noted along the periventricular white matter consistent with mild chronic microvascular ischemic change. There is no evidence of a recent cortical infarct.  There are no extra-axial masses or abnormal fluid collections.  There is no intracranial hemorrhage.  No skull fracture is seen. The sinuses and mastoid air cells are clear.  CT MAXILLOFACIAL FINDINGS  No fracture. There is right sided preseptal periorbital soft tissue swelling extending along the subcutaneous soft tissues of the right cheek and right margin of the mandible. The postseptal orbit on the right is unremarkable. The right  globe is intact. There are changes from previous right cataract surgery.  The left globe and orbit are unremarkable. The sinuses and  mastoid air cells and middle ear cavities are clear.  There are no soft tissue masses or enlarged lymph nodes.  IMPRESSION: Head CT:  No acute intracranial abnormality.  Maxillofacial CT: No fracture. Right preseptal periorbital, right cheek and right mandible region soft tissue swelling.   Electronically Signed   By: Amie Portland M.D.   On: 12/15/2012 17:16    EKG Interpretation   None       MDM  Diagnosis: Multiple contusions secondary to fall  Presents to the ER with complaints of pain and swelling of the right side of her face after a fall. Patient does take Plavix and she does have evidence of significant impact. CT scan of the head and maxillofacial bones were therefore performed and no abnormalities are seen. Examination of the patient's cervical spine and back reveals that there is no evidence of midline tenderness. There clinically cleared. She did complain of pain in the left knee, had an abrasion with some swelling. X-ray was negative.  History taking reveals that the patient stumbled when she turned, there was no syncope or any concern for nonmechanical causes for the fall. She did not have any loss of consciousness and has a normal neurologic exam in the ER.    Gilda Crease, MD 12/15/12 [phone]

## 2012-12-15 NOTE — ED Notes (Signed)
Pt stumbled and fell onto cement floor. Pt has bruising and swelling noted to right side of right eye and mouth and chin. Abrasions noted under right eye. Pt states "i dont think I had any "loc. Alert/oriented now. C/o headache.

## 2012-12-24 ENCOUNTER — Encounter: Payer: Self-pay | Admitting: Cardiology

## 2012-12-24 ENCOUNTER — Ambulatory Visit (INDEPENDENT_AMBULATORY_CARE_PROVIDER_SITE_OTHER): Payer: Medicare Other | Admitting: Cardiology

## 2012-12-24 VITALS — BP 145/73 | HR 97 | Ht 62.5 in | Wt 157.0 lb

## 2012-12-24 DIAGNOSIS — R55 Syncope and collapse: Secondary | ICD-10-CM

## 2012-12-24 NOTE — Progress Notes (Signed)
HPI  She was recently in the hospital with presyncope. She was thought to have mild dehydration. Echocardiogram was unremarkable. Her blood pressure was somewhat low and Lopressor was stopped during her hospitalization. She did have a urinary tract infection. She had mild renal insufficiency.  She's had some continued problems with nausea. However, she's not had any presyncope or new orthostatic symptoms. She's not been having any new chest pressure, neck or arm discomfort. He denies any palpitations. She's had no new shortness of breath. She's starting to use her walker a little more.  Allergies  Allergen Reactions  . Ace Inhibitors Other (See Comments)    Hyperkalemia-mild   . Codeine Nausea And Vomiting  . Penicillins Swelling  . Ropinirole Hcl Other (See Comments)    Pt wasn't in right state of mind    Current Outpatient Prescriptions  Medication Sig Dispense Refill  . acetaminophen (TYLENOL) 650 MG CR tablet Take 650 mg by mouth 2 (two) times daily as needed for pain.      Marland Kitchen aspirin 81 MG tablet Take 81 mg by mouth every morning.       . carbidopa-levodopa (SINEMET IR) 10-100 MG per tablet Take 1-2 tablets by mouth 2 (two) times daily. Takes 1 tablet in the morning & 2 after supper.      . clopidogrel (PLAVIX) 75 MG tablet Take 75 mg by mouth at bedtime.       . Coenzyme Q10 (CO Q-10 PO) Take 1 capsule by mouth daily.      Marland Kitchen dexlansoprazole (DEXILANT) 60 MG capsule Take 60 mg by mouth daily.      Marland Kitchen HYDROcodone-acetaminophen (NORCO/VICODIN) 5-325 MG per tablet Take 0.5 tablets by mouth every 4 (four) hours as needed for pain.  10 tablet  0  . levothyroxine (SYNTHROID) 50 MCG tablet Take 1 tablet (50 mcg total) by mouth daily before breakfast.  30 tablet  0  . pramipexole (MIRAPEX) 0.125 MG tablet Take 0.125 mg by mouth. Take one in the am and two in the evening      . Rasagiline Mesylate (AZILECT) 1 MG TABS Take 1 tablet by mouth daily.        No current facility-administered  medications for this visit.    Past Medical History  Diagnosis Date  . Hypertension   . Arteriosclerotic cardiovascular disease (ASCVD) 1994    1994-PCI to RCA and LAD; nonsustained ventricular tachycardia;cath in 11/06-30% left main, 50% proximal RCA, normal EF; Echo in 06/2010-mild LVH, hyperdynamic LV function  . Gastroesophageal reflux disease     Dysmotility, hiatal hernia; Schatzki's ring-dilated in 02/2010  . Diabetes mellitus, type 2   . UTI (lower urinary tract infection)   . 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 Anemia     Normocytic; with thrombocytopenia  . Parkinson disease   . Nephrolithiasis   . PSVT (paroxysmal supraventricular tachycardia) 2006    S/p RFA-2006  . Tobacco abuse, in remission     50 pack years  . Obesity   . TIA (transient ischemic attack)   . Hyperlipidemia     Past Surgical History  Procedure Laterality Date  . Kidney stone surgery    . Lumbar spine surgery  2007    L2-3 and L3-4 decompressive laminotomies and foraminotomies  . Radiofrequency ablation  2006    atrial tachycardia Dr.Gregg Ladona Ridgel  . Total abdominal hysterectomy    . Appendectomy      ROS:   As stated in  the HPI and negative for all other systems.   PHYSICAL EXAM BP 145/73  Pulse 97  Ht 5' 2.5" (1.588 m)  Wt 157 lb (71.215 kg)  BMI 28.24 kg/m2 GENERAL:  Frail appearing HEENT:  Pupils equal round and reactive, fundi not visualized, oral mucosa unremarkable, trauma right chin NECK:  No jugular venous distention, waveform within normal limits, carotid upstroke brisk and symmetric, no bruits, no thyromegaly LUNGS:  Clear to auscultation bilaterally BACK:  No CVA tenderness CHEST:  Unremarkable HEART:  PMI not displaced or sustained,S1 and S2 within normal limits, no S3, no S4, no clicks, no rubs, no murmurs ABD:  Flat, positive bowel sounds normal in frequency in pitch, no bruits, no rebound, no guarding, no midline pulsatile mass, no  hepatomegaly, no splenomegaly EXT:  2 plus pulses throughout, no edema, no cyanosis no clubbing   ASSESSMENT AND PLAN  Arteriosclerotic cardiovascular disease (ASCVD) -  The patient has no new sypmtoms.  No further cardiovascular testing is indicated.  We will continue with aggressive risk reduction and meds as listed.   Hypertension -  Although her blood pressure is slightly elevated today I will not restart the beta blocker with her recent events  TIA (transient ischemic attack) -  Attributed to the posterior circulation. Maintained on clopidogrel at the request of her neurologist.  We discussed this again today.  Falls - I looked at the hospital records very carefully. It looks like this was not signs syncope and I don't think is a primary arrhythmia. We talked about the need for her to use her walker which she wasn't feeling routinely.

## 2012-12-24 NOTE — Patient Instructions (Signed)
The current medical regimen is effective;  continue present plan and medications.  Follow up in 4 months with Dr Hochrein 

## 2013-04-29 ENCOUNTER — Encounter: Payer: Self-pay | Admitting: Cardiology

## 2013-04-29 ENCOUNTER — Ambulatory Visit (INDEPENDENT_AMBULATORY_CARE_PROVIDER_SITE_OTHER): Payer: Medicare Other | Admitting: Cardiology

## 2013-04-29 VITALS — BP 126/82 | HR 101 | Ht 62.5 in | Wt 179.0 lb

## 2013-04-29 DIAGNOSIS — I471 Supraventricular tachycardia: Secondary | ICD-10-CM

## 2013-04-29 DIAGNOSIS — R55 Syncope and collapse: Secondary | ICD-10-CM

## 2013-04-29 DIAGNOSIS — I10 Essential (primary) hypertension: Secondary | ICD-10-CM

## 2013-04-29 NOTE — Progress Notes (Signed)
HPI  She was previously in the hospital with presyncope. She was thought to have mild dehydration. Echocardiogram does demonstrate some LVH with some LV outflow obstruction with a dynamic gradient.  However, she has had no further episodes of near syncope.  Her husband was recently injured in and out of the LAD and then she has had to care for him. Her daughter said this does require her to be more mobile. She does get some orthostatic symptoms mildly but she feels with these conservatively area she also continues to have chronic problems and walks with a cane and sometimes a walker. She's had no new chest pressure, neck or arm discomfort. She's had no new palpitations, presyncope or syncope.  Allergies  Allergen Reactions  . Ace Inhibitors Other (See Comments)    Hyperkalemia-mild   . Codeine Nausea And Vomiting  . Penicillins Swelling  . Ropinirole Hcl Other (See Comments)    Pt wasn't in right state of mind    Current Outpatient Prescriptions  Medication Sig Dispense Refill  . acetaminophen (TYLENOL) 650 MG CR tablet Take 650 mg by mouth 2 (two) times daily as needed for pain.      Marland Kitchen. aspirin 81 MG tablet Take 81 mg by mouth every morning.       . carbidopa-levodopa (SINEMET IR) 10-100 MG per tablet Take 1-2 tablets by mouth 2 (two) times daily. Takes 1 tablet in the morning & 2 after supper.      . clopidogrel (PLAVIX) 75 MG tablet Take 75 mg by mouth at bedtime.       . Coenzyme Q10 (CO Q-10 PO) Take 1 capsule by mouth daily.      Marland Kitchen. glipiZIDE (GLUCOTROL) 5 MG tablet Take 5 mg by mouth daily before breakfast.      . HYDROcodone-acetaminophen (NORCO/VICODIN) 5-325 MG per tablet Take 0.5 tablets by mouth every 4 (four) hours as needed for pain.  10 tablet  0  . levothyroxine (SYNTHROID) 50 MCG tablet Take 1 tablet (50 mcg total) by mouth daily before breakfast.  30 tablet  0  . pramipexole (MIRAPEX) 0.125 MG tablet Take 0.125 mg by mouth. Take one in the am and two in the evening        . Rasagiline Mesylate (AZILECT) 1 MG TABS Take 1 tablet by mouth daily.       Marland Kitchen. dexlansoprazole (DEXILANT) 60 MG capsule Take 60 mg by mouth daily.       No current facility-administered medications for this visit.    Past Medical History  Diagnosis Date  . Hypertension   . Arteriosclerotic cardiovascular disease (ASCVD) 1994    1994-PCI to RCA and LAD; nonsustained ventricular tachycardia;cath in 11/06-30% left main, 50% proximal RCA, normal EF; Echo in 06/2010-mild LVH, hyperdynamic LV function  . Gastroesophageal reflux disease     Dysmotility, hiatal hernia; Schatzki's ring-dilated in 02/2010  . Diabetes mellitus, type 2   . UTI (lower urinary tract infection)   . 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 Anemia     Normocytic; with thrombocytopenia  . Parkinson disease   . Nephrolithiasis   . PSVT (paroxysmal supraventricular tachycardia) 2006    S/p RFA-2006  . Tobacco abuse, in remission     50 pack years  . Obesity   . TIA (transient ischemic attack)   . Hyperlipidemia     Past Surgical History  Procedure Laterality Date  . Kidney stone surgery    . Lumbar spine  surgery  2007    L2-3 and L3-4 decompressive laminotomies and foraminotomies  . Radiofrequency ablation  2006    atrial tachycardia Dr.Gregg Ladona Ridgel  . Total abdominal hysterectomy    . Appendectomy      ROS:   As stated in the HPI and negative for all other systems.   PHYSICAL EXAM BP 126/82  Pulse 101  Wt 179 lb (81.194 kg) GENERAL: NAD HEENT:  Pupils equal round and reactive, fundi not visualized, oral mucosa unremarkable, trauma right chin NECK:  No jugular venous distention, waveform within normal limits, carotid upstroke brisk and symmetric, no bruits, no thyromegaly LUNGS:  Clear to auscultation bilaterally BACK:  No CVA tenderness CHEST:  Unremarkable HEART:  PMI not displaced or sustained,S1 and S2 within normal limits, no S3, no S4, no clicks, no rubs, no  murmurs (I do not appreciate a dynamic murmur) ABD:  Flat, positive bowel sounds normal in frequency in pitch, no bruits, no rebound, no guarding, no midline pulsatile mass, no hepatomegaly, no splenomegaly EXT:  2 plus pulses throughout, no edema, no cyanosis no clubbing  EKG:  Normal sinus rhythm, rate 90, axis within normal limits, intervals within normal limits, anterior R wave, no acute ST-T wave changes.  04/29/2013  ASSESSMENT AND PLAN  Arteriosclerotic cardiovascular disease (ASCVD) -  The patient has no new sypmtoms.  No further cardiovascular testing is indicated.  We will continue with aggressive risk reduction and meds as listed.   Hypertension -  The blood pressure is at target. No change in medications is indicated. We will continue with therapeutic lifestyle changes (TLC).  TIA (transient ischemic attack) -  Attributed to the posterior circulation. Maintained on clopidogrel at the request of her neurologist.

## 2013-04-29 NOTE — Patient Instructions (Signed)
The current medical regimen is effective;  continue present plan and medications.  Follow up in 18 months with Dr Hochrein.  You will receive a letter in the mail 2 months before you are due.  Please call us when you receive this letter to schedule your follow up appointment.  

## 2014-03-18 ENCOUNTER — Ambulatory Visit (HOSPITAL_COMMUNITY)
Admission: RE | Admit: 2014-03-18 | Discharge: 2014-03-18 | Disposition: A | Discharge status: Home / Self Care | Payer: Medicare Other | Source: Ambulatory Visit | Attending: Family Medicine | Admitting: Family Medicine

## 2014-03-18 ENCOUNTER — Other Ambulatory Visit (HOSPITAL_COMMUNITY): Payer: Self-pay | Admitting: Family Medicine

## 2014-03-18 DIAGNOSIS — S99921A Unspecified injury of right foot, initial encounter: Secondary | ICD-10-CM | POA: Insufficient documentation

## 2014-03-18 DIAGNOSIS — W228XXA Striking against or struck by other objects, initial encounter: Secondary | ICD-10-CM | POA: Diagnosis not present

## 2014-03-18 DIAGNOSIS — T148XXA Other injury of unspecified body region, initial encounter: Secondary | ICD-10-CM

## 2014-03-18 DIAGNOSIS — M79671 Pain in right foot: Secondary | ICD-10-CM | POA: Insufficient documentation

## 2014-10-20 ENCOUNTER — Ambulatory Visit (INDEPENDENT_AMBULATORY_CARE_PROVIDER_SITE_OTHER): Payer: Medicare Other | Admitting: Cardiology

## 2014-10-20 ENCOUNTER — Other Ambulatory Visit (INDEPENDENT_AMBULATORY_CARE_PROVIDER_SITE_OTHER): Payer: 59

## 2014-10-20 ENCOUNTER — Encounter: Payer: Self-pay | Admitting: Cardiology

## 2014-10-20 VITALS — BP 81/51 | HR 98 | Ht 62.0 in | Wt 192.0 lb

## 2014-10-20 DIAGNOSIS — I951 Orthostatic hypotension: Secondary | ICD-10-CM

## 2014-10-20 DIAGNOSIS — I471 Supraventricular tachycardia, unspecified: Secondary | ICD-10-CM

## 2014-10-20 DIAGNOSIS — R42 Dizziness and giddiness: Secondary | ICD-10-CM | POA: Diagnosis not present

## 2014-10-20 DIAGNOSIS — R0602 Shortness of breath: Secondary | ICD-10-CM | POA: Diagnosis not present

## 2014-10-20 NOTE — Patient Instructions (Addendum)
Medication Instructions:  The current medical regimen is effective;  continue present plan and medications.  Labwork: Please have blood work today (BNP/TSH) at Dr Avery Dennison office.  Testing/Procedures: Your physician has requested that you have an echocardiogram. Echocardiography is a painless test that uses sound waves to create images of your heart. It provides your doctor with information about the size and shape of your heart and how well your heart's chambers and valves are working. This procedure takes approximately one hour. There are no restrictions for this procedure.  Follow-Up: Follow up in 1 month with Dr Antoine Poche in [place].  Thank you for choosing [facility]!!

## 2014-10-20 NOTE — Progress Notes (Signed)
HPI  The patient presents for follow up of  LVH with some LV outflow obstruction with a dynamic gradient.  She has had near syncope in the past.  She doesn't report that she's having any near syncope or syncope recently.  She was sent for routine follow-up. However, today she says she's not feeling well. She can't really specify this. She states she has decreased energy. She's not sleeping well. She's not describing any chest pressure, neck or arm discomfort. However, she's had increasing shortness of breath for 2-3 months. She sleeps in a hospital bed which she has done apparently for some while and so her head is elevated. This is not any different. Her weight is actually down but she has some increased lower extremity swelling. She's not exactly describing any PND or orthopnea. She's not describing palpitations, presyncope or syncope. She gets around poorly because of joint problems. She uses a walker.  Allergies  Allergen Reactions  . Ace Inhibitors Other (See Comments)    Hyperkalemia-mild   . Codeine Nausea And Vomiting  . Penicillins Swelling  . Ropinirole Hcl Other (See Comments)    Pt wasn't in right state of mind    Current Outpatient Prescriptions  Medication Sig Dispense Refill  . acetaminophen (TYLENOL) 650 MG CR tablet Take 650 mg by mouth 2 (two) times daily as needed for pain.    Marland Kitchen aspirin 81 MG tablet Take 81 mg by mouth every morning.     . carbidopa-levodopa (SINEMET IR) 10-100 MG per tablet Take 1-2 tablets by mouth 2 (two) times daily. Takes 1 tablet in the morning & 2 after supper.    . celecoxib (CELEBREX) 200 MG capsule Take 1 capsule by mouth daily.    . clopidogrel (PLAVIX) 75 MG tablet Take 75 mg by mouth at bedtime.     . furosemide (LASIX) 40 MG tablet Take 20 mg by mouth 2 (two) times daily.    Marland Kitchen glipiZIDE (GLUCOTROL) 5 MG tablet Take 5 mg by mouth daily before breakfast.    . levothyroxine (SYNTHROID) 50 MCG tablet Take 1 tablet (50 mcg total) by mouth daily  before breakfast. 30 tablet 0  . LYRICA 100 MG capsule Take 1 tablet by mouth 3 (three) times daily. (1 am & 2 pm)    . pramipexole (MIRAPEX) 0.125 MG tablet Take 0.125 mg by mouth. Take one in the am and two in the evening    . pravastatin (PRAVACHOL) 40 MG tablet Take 1 tablet by mouth daily.    . Rasagiline Mesylate (AZILECT) 1 MG TABS Take 1 tablet by mouth daily.     Marland Kitchen tiZANidine (ZANAFLEX) 4 MG tablet Take 4 mg by mouth as needed.     . Vitamin D, Ergocalciferol, (DRISDOL) 50000 UNITS CAPS capsule Take 1 capsule by mouth once a week.     No current facility-administered medications for this visit.    Past Medical History  Diagnosis Date  . Hypertension   . Arteriosclerotic cardiovascular disease (ASCVD) 1994    1994-PCI to RCA and LAD; nonsustained ventricular tachycardia;cath in 11/06-30% left main, 50% proximal RCA, normal EF; Echo in 06/2010-mild LVH, hyperdynamic LV function  . Gastroesophageal reflux disease     Dysmotility, hiatal hernia; Schatzki's ring-dilated in 02/2010  . Diabetes mellitus, type 2   . UTI (lower urinary tract infection)   . 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 Anemia     Normocytic; with thrombocytopenia  . Parkinson disease   .  Nephrolithiasis   . PSVT (paroxysmal supraventricular tachycardia) 2006    S/p RFA-2006  . Tobacco abuse, in remission     50 pack years  . Obesity   . TIA (transient ischemic attack)   . Hyperlipidemia     Past Surgical History  Procedure Laterality Date  . Kidney stone surgery    . Lumbar spine surgery  2007    L2-3 and L3-4 decompressive laminotomies and foraminotomies  . Radiofrequency ablation  2006    atrial tachycardia Dr.Gregg Ladona Ridgel  . Total abdominal hysterectomy    . Appendectomy      ROS:   As stated in the HPI and negative for all other systems.   PHYSICAL EXAM BP 81/51 mmHg  Pulse 98  Ht  (1.575 m)  Wt 192 lb (87.091 kg)  BMI 35.11 kg/m2  SpO2  97% GENERAL: NAD HEENT:  Pupils equal round and reactive, fundi not visualized, oral mucosa unremarkable, trauma right chin NECK:  No jugular venous distention, waveform within normal limits, carotid upstroke brisk and symmetric, no bruits, no thyromegaly LUNGS:  Clear to auscultation bilaterally BACK:  No CVA tenderness CHEST:  Unremarkable HEART:  PMI not displaced or sustained,S1 and S2 within normal limits, no S3, no S4, no clicks, no rubs, no murmurs (I do not appreciate a dynamic murmur) ABD:  Flat, positive bowel sounds normal in frequency in pitch, no bruits, no rebound, no guarding, no midline pulsatile mass, no hepatomegaly, no splenomegaly EXT:  2 plus pulses throughout, right greater than left leg edema, no cyanosis no clubbing  EKG:  Normal sinus rhythm, rate 97, axis within normal limits, intervals within normal limits,poor anterior R wave, anterolateral myocardial infarction which appears to be old but new since previous. There also appears to be an old inferior infarct.  10/20/2014  ASSESSMENT AND PLAN  Arteriosclerotic cardiovascular disease (ASCVD) -  Her EKG is different than previous but she reports no recent angina. I will check an echocardiogram to see if there is any change in her LV function and wall motion. This will also allow [place] to evaluate her LVH.   Hypertension -  We repeated and verified her blood pressures actually running low. Her granddaughter who works here is going to keep an eye on that and report some blood pressure readings to me.   Dyspnea - She subjectively short of breath and her oxygen saturation is good at rest. I'm going to check some labs to include a BNP, CBC and thyroid if these have not been done recently.

## 2014-10-21 ENCOUNTER — Other Ambulatory Visit (HOSPITAL_COMMUNITY): Payer: Self-pay | Admitting: Family Medicine

## 2014-10-21 DIAGNOSIS — Z1231 Encounter for screening mammogram for malignant neoplasm of breast: Secondary | ICD-10-CM

## 2014-10-21 LAB — TSH: TSH: 32.8 u[IU]/mL — AB (ref 0.450–4.500)

## 2014-10-21 LAB — BRAIN NATRIURETIC PEPTIDE: BNP: 196.9 pg/mL — AB (ref 0.0–100.0)

## 2014-10-22 ENCOUNTER — Ambulatory Visit (HOSPITAL_COMMUNITY)
Admission: RE | Admit: 2014-10-22 | Discharge: 2014-10-22 | Disposition: A | Discharge status: Home / Self Care | Payer: Medicare Other | Source: Ambulatory Visit | Attending: Cardiology | Admitting: Cardiology

## 2014-10-22 DIAGNOSIS — E119 Type 2 diabetes mellitus without complications: Secondary | ICD-10-CM | POA: Diagnosis not present

## 2014-10-22 DIAGNOSIS — R42 Dizziness and giddiness: Secondary | ICD-10-CM | POA: Diagnosis not present

## 2014-10-22 DIAGNOSIS — R0602 Shortness of breath: Secondary | ICD-10-CM

## 2014-10-22 DIAGNOSIS — R0600 Dyspnea, unspecified: Secondary | ICD-10-CM | POA: Diagnosis not present

## 2014-10-22 DIAGNOSIS — I2510 Atherosclerotic heart disease of native coronary artery without angina pectoris: Secondary | ICD-10-CM | POA: Diagnosis not present

## 2014-10-22 DIAGNOSIS — K219 Gastro-esophageal reflux disease without esophagitis: Secondary | ICD-10-CM | POA: Diagnosis not present

## 2014-10-22 DIAGNOSIS — I10 Essential (primary) hypertension: Secondary | ICD-10-CM | POA: Insufficient documentation

## 2014-10-22 DIAGNOSIS — I951 Orthostatic hypotension: Secondary | ICD-10-CM | POA: Diagnosis not present

## 2014-10-22 DIAGNOSIS — I371 Nonrheumatic pulmonary valve insufficiency: Secondary | ICD-10-CM | POA: Diagnosis not present

## 2014-10-22 DIAGNOSIS — E785 Hyperlipidemia, unspecified: Secondary | ICD-10-CM | POA: Diagnosis not present

## 2014-10-22 DIAGNOSIS — I081 Rheumatic disorders of both mitral and tricuspid valves: Secondary | ICD-10-CM | POA: Insufficient documentation

## 2014-10-25 ENCOUNTER — Telehealth: Payer: Self-pay | Admitting: Cardiology

## 2014-10-25 NOTE — Telephone Encounter (Signed)
Reviewed results with pt who states understanding.  She is aware I am sending results to Dr Lysbeth Galas for him to adjust her medication for treatment of elevated TSH.

## 2014-10-25 NOTE — Telephone Encounter (Signed)
New Message  Pt called back for lab results. Requests a call back this morning.

## 2014-11-01 ENCOUNTER — Ambulatory Visit (HOSPITAL_COMMUNITY)
Admission: RE | Admit: 2014-11-01 | Discharge: 2014-11-01 | Disposition: A | Discharge status: Home / Self Care | Payer: Medicare Other | Source: Ambulatory Visit | Attending: Family Medicine | Admitting: Family Medicine

## 2014-11-01 ENCOUNTER — Other Ambulatory Visit (HOSPITAL_COMMUNITY): Payer: Self-pay | Admitting: Family Medicine

## 2014-11-01 DIAGNOSIS — Z1231 Encounter for screening mammogram for malignant neoplasm of breast: Secondary | ICD-10-CM

## 2014-11-17 ENCOUNTER — Encounter: Payer: Self-pay | Admitting: Cardiology

## 2014-11-17 ENCOUNTER — Ambulatory Visit (INDEPENDENT_AMBULATORY_CARE_PROVIDER_SITE_OTHER): Payer: Medicare Other | Admitting: Cardiology

## 2014-11-17 VITALS — BP 90/50 | HR 92 | Ht 62.0 in | Wt 194.0 lb

## 2014-11-17 DIAGNOSIS — R0600 Dyspnea, unspecified: Secondary | ICD-10-CM

## 2014-11-17 NOTE — Progress Notes (Signed)
HPI  The patient presents for follow up of  LVH with some LV outflow obstruction with a dynamic gradient.  She has had near syncope in the past.  She came to see me for follow-up a few weeks ago and she was having subjective symmetric and shortness of breath and looked very weak. Objectively her sats were okay. I did a BNP which was only minimally elevated. Echocardiogram demonstrated some lipomatous changes to her LV septum but no other significant abnormalities and she had a well preserved ejection fraction. However, her TSH was greater than 30. She has since had her thyroid replacement therapy adjusted. She still doesn't feel well but not as acutely bad as she did previously. She has some episodes of shortness of breath but these are sporadic short-lived. She's not describing palpitations. She's not had any presyncope or syncope. Her lower extremity swelling is unchanged from previous. She's had no chest pressure, neck or arm discomfort.   Allergies  Allergen Reactions  . Ace Inhibitors Other (See Comments)    Hyperkalemia-mild   . Codeine Nausea And Vomiting  . Penicillins Swelling  . Ropinirole Hcl Other (See Comments)    Pt wasn't in right state of mind    Current Outpatient Prescriptions  Medication Sig Dispense Refill  . acetaminophen (TYLENOL) 650 MG CR tablet Take 650 mg by mouth 2 (two) times daily as needed for pain.    Marland Kitchen aspirin 81 MG tablet Take 81 mg by mouth every morning.     . carbidopa-levodopa (SINEMET IR) 10-100 MG per tablet Take 1-2 tablets by mouth 2 (two) times daily. Takes 1 tablet in the morning & 1 after supper.    . celecoxib (CELEBREX) 200 MG capsule Take 1 capsule by mouth daily.    . clopidogrel (PLAVIX) 75 MG tablet Take 75 mg by mouth at bedtime.     . furosemide (LASIX) 40 MG tablet Take 40 mg by mouth. Take 1/2 twice daily    . glipiZIDE (GLUCOTROL) 5 MG tablet Take 5 mg by mouth 2 (two) times daily before a meal.     . levothyroxine (SYNTHROID,  LEVOTHROID) 75 MCG tablet Take 75 mcg by mouth daily before breakfast.    . LYRICA 100 MG capsule Take 1 tablet by mouth 3 (three) times daily. (1 am & 2 pm)    . pramipexole (MIRAPEX) 0.125 MG tablet Take 0.125 mg by mouth. Take one in the am and two in the evening    . pravastatin (PRAVACHOL) 40 MG tablet Take 1 tablet by mouth daily.    . Rasagiline Mesylate (AZILECT) 1 MG TABS Take 1 tablet by mouth daily.     Marland Kitchen tiZANidine (ZANAFLEX) 4 MG tablet Take 4 mg by mouth as needed.     . Vitamin D, Ergocalciferol, (DRISDOL) 50000 UNITS CAPS capsule Take 1 capsule by mouth once a week.     No current facility-administered medications for this visit.    Past Medical History  Diagnosis Date  . Hypertension   . Arteriosclerotic cardiovascular disease (ASCVD) 1994    1994-PCI to RCA and LAD; nonsustained ventricular tachycardia;cath in 11/06-30% left main, 50% proximal RCA, normal EF; Echo in 06/2010-mild LVH, hyperdynamic LV function  . Gastroesophageal reflux disease     Dysmotility, hiatal hernia; Schatzki's ring-dilated in 02/2010  . Diabetes mellitus, type 2 (HCC)   . UTI (lower urinary tract infection)   . 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 Anemia  Normocytic; with thrombocytopenia  . Parkinson disease (HCC)   . Nephrolithiasis   . PSVT (paroxysmal supraventricular tachycardia) (HCC) 2006    S/p RFA-2006  . Tobacco abuse, in remission     50 pack years  . Obesity   . TIA (transient ischemic attack)   . Hyperlipidemia     Past Surgical History  Procedure Laterality Date  . Kidney stone surgery    . Lumbar spine surgery  2007    L2-3 and L3-4 decompressive laminotomies and foraminotomies  . Radiofrequency ablation  2006    atrial tachycardia Dr.Gregg Ladona Ridgel  . Total abdominal hysterectomy    . Appendectomy      ROS:   As stated in the HPI and negative for all other systems.   PHYSICAL EXAM BP 90/50 mmHg  Pulse 92  Ht  (1.575 m)   Wt 194 lb (87.998 kg)  BMI 35.47 kg/m2 GENERAL: NAD HEENT:  Pupils equal round and reactive, fundi not visualized, oral mucosa unremarkable, trauma right chin NECK:  No jugular venous distention, waveform within normal limits, carotid upstroke brisk and symmetric, no bruits, no thyromegaly LUNGS:  Clear to auscultation bilaterally BACK:  No CVA tenderness CHEST:  Unremarkable HEART:  PMI not displaced or sustained,S1 and S2 within normal limits, no S3, no S4, no clicks, no rubs, no murmurs (I do not appreciate a dynamic murmur) ABD:  Flat, positive bowel sounds normal in frequency in pitch, no bruits, no rebound, no guarding, no midline pulsatile mass, no hepatomegaly, no splenomegaly EXT:  2 plus pulses throughout, right greater than left leg edema, no cyanosis no clubbing   ASSESSMENT AND PLAN  Arteriosclerotic cardiovascular disease (ASCVD) -  I do not suspect that coronary disease is contributing to current symptoms. I have encouraged her to follow-up with Josue Hector, MD.  If she has continued dyspnea is otherwise unexplained we could consider stress perfusion testing though I think the pretest probability is low.   Hypertension -  We repeated and verified her blood pressures actually running low. I reviewed an extensive blood pressure diary and her blood pressures a little bit labile but typically well .  Dyspnea - Some of her symptoms of not feeling well and subjective shortness of breath could certainly be related to her thyroid and we will need to wait and see how she does with a longer course of adjusted medications.

## 2014-11-17 NOTE — Patient Instructions (Signed)
Medication Instructions:  The current medical regimen is effective;  continue present plan and medications.  Follow-Up: Follow up in 1 year with Dr. Hochrein.  You will receive a letter in the mail 2 months before you are due.  Please call us when you receive this letter to schedule your follow up appointment.  Thank you for choosing [facility]!!      

## 2015-05-05 ENCOUNTER — Other Ambulatory Visit (HOSPITAL_COMMUNITY): Payer: Self-pay | Admitting: Adult Health Nurse Practitioner

## 2015-05-05 DIAGNOSIS — Z780 Asymptomatic menopausal state: Secondary | ICD-10-CM

## 2015-05-12 ENCOUNTER — Ambulatory Visit (HOSPITAL_COMMUNITY)
Admission: RE | Admit: 2015-05-12 | Discharge: 2015-05-12 | Disposition: A | Discharge status: Home / Self Care | Payer: Medicare Other | Source: Ambulatory Visit | Attending: Adult Health Nurse Practitioner | Admitting: Adult Health Nurse Practitioner

## 2015-05-12 DIAGNOSIS — Z780 Asymptomatic menopausal state: Secondary | ICD-10-CM | POA: Diagnosis not present

## 2015-05-12 DIAGNOSIS — M810 Age-related osteoporosis without current pathological fracture: Secondary | ICD-10-CM | POA: Insufficient documentation

## 2015-10-03 ENCOUNTER — Other Ambulatory Visit (HOSPITAL_COMMUNITY): Payer: Self-pay | Admitting: Family Medicine

## 2015-10-03 DIAGNOSIS — Z1231 Encounter for screening mammogram for malignant neoplasm of breast: Secondary | ICD-10-CM

## 2015-11-03 ENCOUNTER — Ambulatory Visit (HOSPITAL_COMMUNITY): Payer: Medicare Other

## 2015-11-03 ENCOUNTER — Encounter (HOSPITAL_COMMUNITY): Payer: Self-pay

## 2015-11-03 ENCOUNTER — Other Ambulatory Visit (HOSPITAL_COMMUNITY): Payer: Self-pay | Admitting: Family Medicine

## 2015-11-03 ENCOUNTER — Ambulatory Visit (HOSPITAL_COMMUNITY)
Admission: RE | Admit: 2015-11-03 | Discharge: 2015-11-03 | Disposition: A | Discharge status: Home / Self Care | Payer: Medicare Other | Source: Ambulatory Visit | Attending: Family Medicine | Admitting: Family Medicine

## 2015-11-03 DIAGNOSIS — Z1231 Encounter for screening mammogram for malignant neoplasm of breast: Secondary | ICD-10-CM | POA: Diagnosis not present

## 2015-11-24 ENCOUNTER — Encounter: Payer: Self-pay | Admitting: *Deleted

## 2015-12-06 NOTE — Progress Notes (Signed)
HPI  The patient presents for follow up of  LVH with some LV outflow obstruction with a dynamic gradient.  She has had near syncope in the past.  She returns for yearly follow up.  Her predominant complaint has been dyspnea.   She came to see me in the past with  shortness of breath and looked very weak. Objectively her sats were okay. I did a BNP which was only minimally elevated. Echocardiogram in 2016 demonstrated some lipomatous changes to her LV septum but no other significant abnormalities and she had a well preserved ejection fraction. However, her TSH was greater than 30. She has been seen by Dr. Lysbeth GalasNyland for thyroid replacement therapy. Since I saw her she's had problems with right leg pain and now does barely any walking. She walks a little bit in her house with a walker. She's had this evaluated and was told apparently that these are chronic orthopedic problems and she is deciding not to have any surgery. She has some easy bruising and was asking about whether she could come off aspirin and Plavix. She probably feels better than she used to as her thyroid replacement has been normalized. She doesn't seem to be as dyspneic but again she's not as active. She's not describing any chest pressure, neck or arm discomfort. She's not having any palpitations, presyncope or syncope. She's had no PND or orthopnea.  Allergies  Allergen Reactions  . Ace Inhibitors Other (See Comments)    Hyperkalemia-mild   . Codeine Nausea And Vomiting  . Penicillins Swelling  . Ropinirole Hcl Other (See Comments)    Pt wasn't in right state of mind    Current Outpatient Prescriptions  Medication Sig Dispense Refill  . acetaminophen (TYLENOL) 650 MG CR tablet Take 650 mg by mouth 2 (two) times daily as needed for pain.    . carbidopa-levodopa (SINEMET IR) 10-100 MG per tablet Take 1-2 tablets by mouth 2 (two) times daily. Takes 1 tablet in the morning & 1 after supper.    . clopidogrel (PLAVIX) 75 MG tablet Take  75 mg by mouth at bedtime.     . furosemide (LASIX) 40 MG tablet Take 40 mg by mouth. Take 1/2 twice daily    . glipiZIDE (GLUCOTROL XL) 10 MG 24 hr tablet TAKE  (1)  TABLET BY MOUTH TWICE A DAY.    Marland Kitchen. levothyroxine (SYNTHROID) 100 MCG tablet Take by mouth.    Marland Kitchen. lisinopril (PRINIVIL,ZESTRIL) 2.5 MG tablet Take 5 mg by mouth daily.     Marland Kitchen. LYRICA 100 MG capsule 1 tablet. Take one tablet 1 tablet by mouth in the morning and 2 tablets in the evening    . pramipexole (MIRAPEX) 0.5 MG tablet 0.5 mg.    . pravastatin (PRAVACHOL) 40 MG tablet Take 1 tablet by mouth daily.    . Rasagiline Mesylate (AZILECT) 1 MG TABS Take 1 tablet by mouth daily.     Marland Kitchen. tiZANidine (ZANAFLEX) 4 MG tablet Take 4 mg by mouth as needed.     . Vitamin D, Ergocalciferol, (DRISDOL) 50000 UNITS CAPS capsule Take 1 capsule by mouth once a week.     No current facility-administered medications for this visit.     Past Medical History:  Diagnosis Date  . Anemia    Normocytic; with thrombocytopenia  . Arteriosclerotic cardiovascular disease (ASCVD) 1994   1994-PCI to RCA and LAD; nonsustained ventricular tachycardia;cath in 11/06-30% left main, 50% proximal RCA, normal EF; Echo in 06/2010-mild LVH, hyperdynamic LV function  .  Diabetes mellitus, type 2 (HCC)   . Gastroesophageal reflux disease    Dysmotility, hiatal hernia; Schatzki's ring-dilated in 02/2010  . Hyperlipidemia   . Hypertension   . Hypothyroid    Hyperthyroidism secondary to excessive replacement  . Nephrolithiasis   . Obesity   . Parkinson disease (HCC)   . PSVT (paroxysmal supraventricular tachycardia) (HCC) 2006   S/p RFA-2006  . TIA (transient ischemic attack)   . Tobacco abuse, in remission    50 pack years  . UTI (lower urinary tract infection)     Past Surgical History:  Procedure Laterality Date  . APPENDECTOMY    . KIDNEY STONE SURGERY    . LUMBAR SPINE SURGERY  2007   L2-3 and L3-4 decompressive laminotomies and foraminotomies  .  RADIOFREQUENCY ABLATION  2006   atrial tachycardia Dr.Gregg Ladona Ridgel  . TOTAL ABDOMINAL HYSTERECTOMY      ROS:   As stated in the HPI and negative for all other systems.   PHYSICAL EXAM BP 130/74   Pulse 92   Ht 5\' 3"  (1.6 m)   Wt 185 lb (83.9 kg)   BMI 32.77 kg/m  GEN:  No distress NECK:  No jugular venous distention at 90 degrees, waveform within normal limits, carotid upstroke brisk and symmetric, right bruit, no thyromegaly LYMPHATICS:  No cervical adenopathy LUNGS:  Clear to auscultation bilaterally BACK:  No CVA tenderness CHEST:  Unremarkable HEART:  S1 and S2 within normal limits, no S3, no S4, no clicks, no rubs, no murmurs ABD:  Positive bowel sounds normal in frequency in pitch, no bruits, no rebound, no guarding, unable to assess midline mass or bruit with the patient seated. EXT:  2 plus pulses throughout, moderate edema, no cyanosis no clubbing SKIN:  No rashes no nodules NEURO:  Cranial nerves II through XII grossly intact, motor grossly intact throughout.  Resting tremor.    EKG:  Sinus rhythm, rate 92, axis within normal limits, old inferior infarct, probable old anteroseptal infarct. 12/07/2015  ASSESSMENT AND PLAN  Arteriosclerotic cardiovascular disease (ASCVD) -  The patient has no new sypmtoms.  No further cardiovascular testing is indicated.  We will continue with aggressive risk reduction and meds as listed.  of note I don't see an indication for DAPT.  She has some easy bruising. She's very hesitant to stop Plavix and so she will stop her aspirin.   Hypertension -  The blood pressure is at target. No change in medications is indicated. We will continue with therapeutic lifestyle changes (TLC).  Bruit - I will order carotid Dopplers

## 2015-12-07 ENCOUNTER — Encounter: Payer: Self-pay | Admitting: Cardiology

## 2015-12-07 ENCOUNTER — Ambulatory Visit (INDEPENDENT_AMBULATORY_CARE_PROVIDER_SITE_OTHER): Payer: Medicare Other | Admitting: Cardiology

## 2015-12-07 VITALS — BP 130/74 | HR 92 | Ht 63.0 in | Wt 185.0 lb

## 2015-12-07 DIAGNOSIS — I6523 Occlusion and stenosis of bilateral carotid arteries: Secondary | ICD-10-CM

## 2015-12-07 DIAGNOSIS — I10 Essential (primary) hypertension: Secondary | ICD-10-CM

## 2015-12-07 NOTE — Patient Instructions (Signed)
Medication Instructions:  Please stop your ASA. Continue all other medications as listed.  Testing/Procedures: Your physician has requested that you have a carotid duplex at [facility]. This test is an ultrasound of the carotid arteries in your neck. It looks at blood flow through these arteries that supply the brain with blood. Allow one hour for this exam. There are no restrictions or special instructions. Please report to the [facility] on Friday October 27 at 2:15 pm.  Follow-Up: Follow up in 1 year with Dr. Antoine [place]Hochrein in [place].  You will receive a letter in the mail 2 months before you are due.  Please call us when you receive this letter to schedule your follow up appointment.  If you need a refill on your cardiac medications before your next appointment, please call your pharmacy.  Thank you for choosing [facility]!!

## 2015-12-09 ENCOUNTER — Ambulatory Visit (HOSPITAL_COMMUNITY)
Admission: RE | Admit: 2015-12-09 | Discharge: 2015-12-09 | Disposition: A | Discharge status: Home / Self Care | Payer: Medicare Other | Source: Ambulatory Visit | Attending: Cardiology | Admitting: Cardiology

## 2015-12-09 DIAGNOSIS — I6522 Occlusion and stenosis of left carotid artery: Secondary | ICD-10-CM | POA: Diagnosis not present

## 2015-12-09 DIAGNOSIS — I6523 Occlusion and stenosis of bilateral carotid arteries: Secondary | ICD-10-CM | POA: Diagnosis present

## 2016-11-13 NOTE — Progress Notes (Signed)
HPI  The patient presents for follow up of  LVH with some LV outflow obstruction with a dynamic gradient.  She has had near syncope in the past.  She returns for yearly follow up. Echocardiogram in 2016 demonstrated some lipomatous changes to her LV septum but no other significant abnormalities and she had a well preserved ejection fraction.    Since I saw her she has had no acute changes.  She does get around very slowly with a walker secondary to DJD.  Today she had diarrhea.  Otherwise she feels OK.  The patient denies any new symptoms such as chest discomfort, neck or arm discomfort. There has been no new shortness of breath, PND or orthopnea. There have been no reported palpitations, presyncope or syncope.     Allergies  Allergen Reactions  . Ace Inhibitors Other (See Comments)    Hyperkalemia-mild   . Codeine Nausea And Vomiting  . Penicillins Swelling  . Ropinirole Hcl Other (See Comments)    Pt wasn't in right state of mind    Current Outpatient Prescriptions  Medication Sig Dispense Refill  . acetaminophen (TYLENOL) 650 MG CR tablet Take 650 mg by mouth 2 (two) times daily as needed for pain.    . carbidopa-levodopa (SINEMET IR) 10-100 MG tablet Take 1 Table tby mouth 3 Times Daily As Directed for Parkinson's Disease    . clopidogrel (PLAVIX) 75 MG tablet Take 75 mg by mouth at bedtime.     . furosemide (LASIX) 40 MG tablet 40 mg. Take 1/2tablet by mouth twice daily    . glipiZIDE (GLUCOTROL XL) 10 MG 24 hr tablet TAKE  (1)  TABLET BY MOUTH TWICE A DAY.    Marland Kitchen levothyroxine (SYNTHROID) 100 MCG tablet Take 100 mcg by mouth daily before breakfast.     . linagliptin (TRADJENTA) 5 MG TABS tablet Take 5 mg by mouth daily.     Marland Kitchen lisinopril (PRINIVIL,ZESTRIL) 10 MG tablet Take 1 tablet (10 mg total) by mouth daily. 30 tablet 11  . pramipexole (MIRAPEX) 0.75 MG tablet Take 0.75 mg by mouth 3 (three) times daily.     . pravastatin (PRAVACHOL) 40 MG tablet Take 1 tablet by mouth daily.     . pregabalin (LYRICA) 100 MG capsule Take 100 mg by mouth 3 (three) times daily.     . Rasagiline Mesylate (AZILECT) 1 MG TABS Take 1 tablet by mouth daily.     Marland Kitchen tiZANidine (ZANAFLEX) 4 MG tablet 1/2 or 1 Tablet by mout Up To 4 Times Per Day for Muscle Spasms. May Cause Drowsiness    . Vitamin D, Ergocalciferol, (DRISDOL) 50000 UNITS CAPS capsule Take 1 capsule by mouth once a week.     No current facility-administered medications for this visit.     Past Medical History:  Diagnosis Date  . Anemia    Normocytic; with thrombocytopenia  . Arteriosclerotic cardiovascular disease (ASCVD) 1994   1994-PCI to RCA and LAD; nonsustained ventricular tachycardia;cath in 11/06-30% left main, 50% proximal RCA, normal EF; Echo in 06/2010-mild LVH, hyperdynamic LV function  . Diabetes mellitus, type 2 (HCC)   . Gastroesophageal reflux disease    Dysmotility, hiatal hernia; Schatzki's ring-dilated in 02/2010  . Hyperlipidemia   . Hypertension   . Hypothyroid    Hyperthyroidism secondary to excessive replacement  . Nephrolithiasis   . Obesity   . Parkinson disease (HCC)   . PSVT (paroxysmal supraventricular tachycardia) (HCC) 2006   S/p RFA-2006  . TIA (transient ischemic attack)   .  Tobacco abuse, in remission    50 pack years  . UTI (lower urinary tract infection)     Past Surgical History:  Procedure Laterality Date  . APPENDECTOMY    . KIDNEY STONE SURGERY    . LUMBAR SPINE SURGERY  2007   L2-3 and L3-4 decompressive laminotomies and foraminotomies  . RADIOFREQUENCY ABLATION  2006   atrial tachycardia Dr.Gregg Ladona Ridgel  . TOTAL ABDOMINAL HYSTERECTOMY      ROS:   As stated in the HPI and negative for all other systems.   PHYSICAL EXAM BP (!) 82/58   Pulse (!) 105   Ht  (1.6 m)   Wt 176 lb (79.8 kg)   BMI 31.18 kg/m   GENERAL: No distress but somewhat frail.  NECK:  No jugular venous distention, waveform within normal limits, carotid upstroke brisk and symmetric, no  bruits, no thyromegaly LUNGS:  Clear to auscultation bilaterally CHEST:  Unremarkable HEART:  PMI not displaced or sustained,S1 and S2 within normal limits, no S3, no S4, no clicks, no rubs, no murmurs  ABD:  Flat, positive bowel sounds normal in frequency in pitch, no bruits, no rebound, no guarding, no midline pulsatile mass, no hepatomegaly, no splenomegaly EXT:  2 plus pulses throughout, ankle mild edema, no cyanosis no clubbing NERUO:  Resting tremor    EKG:  Sinus rhythm, rate 105 , axis within normal limits, old inferior infarct, probable old anteroseptal infarct. 11/15/2016  ASSESSMENT AND PLAN  Arteriosclerotic cardiovascular disease (ASCVD) -  The patient has no new sypmtoms.  No further cardiovascular testing is indicated.  We will continue with aggressive risk reduction and meds as listed.  Hypertension -  This is certainly not an issue in fact her blood pressure is low. Typically runs a little low recently but this is lower than usual. I'm going to have her hold 2 of her p.m. doses of Lasix and not taking her lisinopril tomorrow. Her family will keep an eye on her pressure and call me if the systolic is below 100.   Bruit - She has left 50% stenosis on Doppler last year.   She needs repeat Doppler.

## 2016-11-14 ENCOUNTER — Ambulatory Visit (INDEPENDENT_AMBULATORY_CARE_PROVIDER_SITE_OTHER): Payer: Medicare Other | Admitting: Cardiology

## 2016-11-14 ENCOUNTER — Encounter: Payer: Self-pay | Admitting: Cardiology

## 2016-11-14 VITALS — BP 82/58 | HR 105 | Ht 63.0 in | Wt 176.0 lb

## 2016-11-14 DIAGNOSIS — I10 Essential (primary) hypertension: Secondary | ICD-10-CM

## 2016-11-14 DIAGNOSIS — I2510 Atherosclerotic heart disease of native coronary artery without angina pectoris: Secondary | ICD-10-CM

## 2016-11-14 DIAGNOSIS — R0989 Other specified symptoms and signs involving the circulatory and respiratory systems: Secondary | ICD-10-CM

## 2016-11-14 MED ORDER — LISINOPRIL 10 MG PO TABS: 10.0000 mg | ORAL_TABLET | Freq: Every day | ORAL | 11 refills | Status: DC

## 2016-11-14 NOTE — Patient Instructions (Addendum)
Medication Instructions:  Please hold you Lisinopril tomorrow. Hold Furosemide for 2 days.  Keep a check on your blood pressure and let us know if it remains low. Continue all other medications as listed.  Testing/Procedures: Your physician has requested that you have a carotid duplex. This test is an ultrasound of the carotid arteries in your neck. It looks at blood flow through these arteries that supply the brain with blood. Allow one hour for this exam. There are no restrictions or special instructions.  This will be completed at [facility].  Please check in at Radiology at 11:15 am.  Follow-Up: Follow up in 6 months with Dr. Antoine Poche.  You will receive a letter in the mail 2 months before you are due.  Please call us when you receive this letter to schedule your follow up appointment.  If you need a refill on your cardiac medications before your next appointment, please call your pharmacy.  Thank you for choosing [facility]!!

## 2016-11-15 ENCOUNTER — Encounter: Payer: Self-pay | Admitting: Cardiology

## 2016-11-20 ENCOUNTER — Ambulatory Visit (HOSPITAL_COMMUNITY): Payer: Medicare Other

## 2016-11-21 ENCOUNTER — Ambulatory Visit (HOSPITAL_COMMUNITY)
Admission: RE | Admit: 2016-11-21 | Discharge: 2016-11-21 | Disposition: A | Discharge status: Home / Self Care | Payer: Medicare Other | Source: Ambulatory Visit | Attending: Family Medicine | Admitting: Family Medicine

## 2016-11-21 ENCOUNTER — Ambulatory Visit (HOSPITAL_COMMUNITY)
Admission: RE | Admit: 2016-11-21 | Discharge: 2016-11-21 | Disposition: A | Discharge status: Home / Self Care | Payer: Medicare Other | Source: Ambulatory Visit | Attending: Cardiology | Admitting: Cardiology

## 2016-11-21 DIAGNOSIS — R0989 Other specified symptoms and signs involving the circulatory and respiratory systems: Secondary | ICD-10-CM | POA: Diagnosis present

## 2016-11-21 DIAGNOSIS — Z1231 Encounter for screening mammogram for malignant neoplasm of breast: Secondary | ICD-10-CM | POA: Diagnosis present

## 2016-11-22 ENCOUNTER — Telehealth: Payer: Self-pay | Admitting: *Deleted

## 2016-11-22 NOTE — Telephone Encounter (Signed)
Spoke with Herbert Seta who is aware of Dr Hochrein's comments and concerns.  She will have patient have BP verified went she goes in for her carotid doppler testing.

## 2016-11-22 NOTE — Telephone Encounter (Signed)
-----   Message from Rollene Rotunda, MD sent at 11/17/2016  6:37 PM EDT ----- Regarding: RE: BP 197/108 Please have her verify this.  Have somebody take the BP.  This does not make sense.  If it is that high she would need to be in the ED.    ----- Message ----- From: Sharin Grave, RN Sent: 11/16/2016  12:56 PM To: Rollene Rotunda, MD Subject: BP 197/108                                       ----- Message ----- From: Norman Clay Sent: 11/16/2016   8:53 AM To: Sharin Grave, RN  Aram Beecham I wanted to let you know about grandma's blood pressure. When she took it yesterday it was still running low, when she took it today it is extremely high. Left arm-197/105 Right arm-185/108, I just wanted to make you aware of this.  Thanks, [employer]

## 2016-11-29 NOTE — Telephone Encounter (Signed)
Per granddaughter Jennifer [name] - BP has been better and staying in the 130's.  Home BP monitor was taken to the appointment for her carotid doppler and was tested to be accurate.  They will continue to monitor and call back if there are changes.  She was grateful for the call and follow up.

## 2017-05-21 NOTE — Progress Notes (Signed)
HPI  The patient presents for follow up of  LVH with some LV outflow obstruction with a dynamic gradient.  She has had near syncope in the past.  She returns for follow up. Echocardiogram in 2016 demonstrated some lipomatous changes to her LV septum but no other significant abnormalities and she had a well preserved ejection fraction.  She has lots of complaints.  She gets around mostly in a wheelchair and a little bit in a walker.  She is limited by severe right knee pain in particular.  She has some swelling in her feet but her feet are probably dependent most of the time.  She has problems with her arms hurting from the elbows down to her fingertips.  She says at times her fingers turn blue although she is not describing this necessarily just in the cold.  She has chronic dyspnea but again she has very minimal activity.  She sleeps chronically on 2 pillows.  She is not describing any of the tachypalpitations that she had before.  She is not describing any chest pressure, neck or arm discomfort.   Allergies  Allergen Reactions  . Ace Inhibitors Other (See Comments)    Hyperkalemia-mild   . Codeine Nausea And Vomiting  . Penicillins Swelling  . Ropinirole Hcl Other (See Comments)    Pt wasn't in right state of mind    Current Outpatient Medications  Medication Sig Dispense Refill  . acetaminophen (TYLENOL) 650 MG CR tablet Take 650 mg by mouth 2 (two) times daily as needed for pain.    . carbidopa-levodopa (SINEMET IR) 10-100 MG tablet Take 1 Table tby mouth 3 Times Daily As Directed for Parkinson's Disease    . clopidogrel (PLAVIX) 75 MG tablet Take 75 mg by mouth at bedtime.     . furosemide (LASIX) 40 MG tablet 40 mg. Take 1/2tablet by mouth twice daily    . glipiZIDE (GLUCOTROL XL) 10 MG 24 hr tablet TAKE  (1)  TABLET BY MOUTH TWICE A DAY.    Marland Kitchen. levothyroxine (SYNTHROID) 88 MCG tablet Take one tablet (88 mcg total) by mouth daily for thyroid.    Marland Kitchen. linagliptin (TRADJENTA) 5 MG TABS  tablet Take 5 mg by mouth daily.     Marland Kitchen. lisinopril (PRINIVIL,ZESTRIL) 10 MG tablet Take 1 tablet (10 mg total) by mouth daily. 30 tablet 11  . pramipexole (MIRAPEX) 0.75 MG tablet Take 0.75 mg by mouth 3 (three) times daily.    . pravastatin (PRAVACHOL) 40 MG tablet Take 1 tablet by mouth daily.    . pregabalin (LYRICA) 100 MG capsule Take 100 mg by mouth 3 (three) times daily.     . selegiline (ELDEPRYL) 5 MG capsule Take 5 mg by mouth 2 (two) times daily.     Marland Kitchen. tiZANidine (ZANAFLEX) 4 MG tablet 1/2 or 1 Tablet by mout Up To 4 Times Per Day for Muscle Spasms. May Cause Drowsiness    . Vitamin D, Ergocalciferol, (DRISDOL) 50000 UNITS CAPS capsule Take 1 capsule by mouth once a week.     No current facility-administered medications for this visit.     Past Medical History:  Diagnosis Date  . Anemia    Normocytic; with thrombocytopenia  . Arteriosclerotic cardiovascular disease (ASCVD) 1994   1994-PCI to RCA and LAD; nonsustained ventricular tachycardia;cath in 11/06-30% left main, 50% proximal RCA, normal EF; Echo in 06/2010-mild LVH, hyperdynamic LV function  . Diabetes mellitus, type 2 (HCC)   . Gastroesophageal reflux disease  Dysmotility, hiatal hernia; Schatzki's ring-dilated in 02/2010  . Hyperlipidemia   . Hypertension   . Hypothyroid    Hyperthyroidism secondary to excessive replacement  . Nephrolithiasis   . Obesity   . Parkinson disease (HCC)   . PSVT (paroxysmal supraventricular tachycardia) (HCC) 2006   S/p RFA-2006  . TIA (transient ischemic attack)   . Tobacco abuse, in remission    50 pack years  . UTI (lower urinary tract infection)     Past Surgical History:  Procedure Laterality Date  . APPENDECTOMY    . KIDNEY STONE SURGERY    . LUMBAR SPINE SURGERY  2007   L2-3 and L3-4 decompressive laminotomies and foraminotomies  . RADIOFREQUENCY ABLATION  2006   atrial tachycardia Dr.Gregg Ladona Ridgel  . TOTAL ABDOMINAL HYSTERECTOMY      ROS:   As stated in the HPI and  negative for all other systems.   PHYSICAL EXAM BP 132/66   Pulse (!) 6   Ht 5\' 3"  (1.6 m)   Wt 170 lb (77.1 kg)   BMI 30.11 kg/m   PHYSICAL EXAM NECK:  No jugular venous distention at 90 degrees, waveform within normal limits, carotid upstroke brisk and symmetric, right bruit , no thyromegaly LUNGS:  Clear to auscultation bilaterally BACK:  No CVA tenderness CHEST:  Unremarkable HEART:  S1 and S2 within normal limits, no S3, no S4, no clicks, no rubs, no murmurs ABD:  Positive bowel sounds normal in frequency in pitch, no bruits, no rebound, no guarding, unable to assess midline mass or bruit with the patient seated. EXT:  2 plus pulses throughout, no edema, no cyanosis no clubbing   EKG:  NA  ASSESSMENT AND PLAN  Arteriosclerotic cardiovascular disease (ASCVD) -  The patient has no new symptoms.  She has a very limited functional status.  I do not think further cardiovascular testing is suggested.  She should continue with risk reduction.  Hypertension -  The blood pressure is at target. No change in medications is indicated. We will continue with therapeutic lifestyle changes (TLC).  Bruit - She has left 50 - 69% stenosis on Doppler last year.   This can be repeated in October of this year.    Arm pain - She is had lumbar back problems.  I would not be surprised if she does not have some cervical disc problems.  She might be having some vasospasm in her hands but she is not giving a classic description for this.  At this point I would not suggest further cardiovascular testing.

## 2017-05-22 ENCOUNTER — Encounter: Payer: Self-pay | Admitting: Cardiology

## 2017-05-22 ENCOUNTER — Ambulatory Visit: Payer: Medicare Other | Admitting: Cardiology

## 2017-05-22 VITALS — BP 132/66 | HR 6 | Ht 63.0 in | Wt 170.0 lb

## 2017-05-22 DIAGNOSIS — I6522 Occlusion and stenosis of left carotid artery: Secondary | ICD-10-CM | POA: Diagnosis not present

## 2017-05-22 DIAGNOSIS — M79602 Pain in left arm: Secondary | ICD-10-CM | POA: Diagnosis not present

## 2017-05-22 DIAGNOSIS — M79601 Pain in right arm: Secondary | ICD-10-CM

## 2017-05-22 DIAGNOSIS — I10 Essential (primary) hypertension: Secondary | ICD-10-CM | POA: Diagnosis not present

## 2017-05-22 DIAGNOSIS — I2510 Atherosclerotic heart disease of native coronary artery without angina pectoris: Secondary | ICD-10-CM

## 2017-05-22 NOTE — Patient Instructions (Signed)

## 2018-05-28 ENCOUNTER — Ambulatory Visit: Payer: Medicare Other | Admitting: Cardiology

## 2018-06-19 ENCOUNTER — Inpatient Hospital Stay (HOSPITAL_COMMUNITY)
Admission: EM | Admit: 2018-06-19 | Discharge: 2018-07-14 | DRG: 207 | Disposition: E | Payer: Medicare Other | Attending: Internal Medicine | Admitting: Internal Medicine

## 2018-06-19 ENCOUNTER — Emergency Department (HOSPITAL_COMMUNITY): Payer: Medicare Other

## 2018-06-19 ENCOUNTER — Encounter (HOSPITAL_COMMUNITY): Payer: Self-pay

## 2018-06-19 DIAGNOSIS — J690 Pneumonitis due to inhalation of food and vomit: Secondary | ICD-10-CM | POA: Diagnosis not present

## 2018-06-19 DIAGNOSIS — R4189 Other symptoms and signs involving cognitive functions and awareness: Secondary | ICD-10-CM | POA: Diagnosis present

## 2018-06-19 DIAGNOSIS — Y95 Nosocomial condition: Secondary | ICD-10-CM | POA: Diagnosis not present

## 2018-06-19 DIAGNOSIS — J9690 Respiratory failure, unspecified, unspecified whether with hypoxia or hypercapnia: Secondary | ICD-10-CM

## 2018-06-19 DIAGNOSIS — R569 Unspecified convulsions: Secondary | ICD-10-CM | POA: Diagnosis present

## 2018-06-19 DIAGNOSIS — E119 Type 2 diabetes mellitus without complications: Secondary | ICD-10-CM | POA: Diagnosis present

## 2018-06-19 DIAGNOSIS — R633 Feeding difficulties: Secondary | ICD-10-CM | POA: Diagnosis present

## 2018-06-19 DIAGNOSIS — I248 Other forms of acute ischemic heart disease: Secondary | ICD-10-CM | POA: Diagnosis present

## 2018-06-19 DIAGNOSIS — R402212 Coma scale, best verbal response, none, at arrival to emergency department: Secondary | ICD-10-CM | POA: Diagnosis present

## 2018-06-19 DIAGNOSIS — Z7902 Long term (current) use of antithrombotics/antiplatelets: Secondary | ICD-10-CM

## 2018-06-19 DIAGNOSIS — Z8249 Family history of ischemic heart disease and other diseases of the circulatory system: Secondary | ICD-10-CM

## 2018-06-19 DIAGNOSIS — N179 Acute kidney failure, unspecified: Secondary | ICD-10-CM | POA: Diagnosis present

## 2018-06-19 DIAGNOSIS — B9620 Unspecified Escherichia coli [E. coli] as the cause of diseases classified elsewhere: Secondary | ICD-10-CM | POA: Diagnosis present

## 2018-06-19 DIAGNOSIS — Z87891 Personal history of nicotine dependence: Secondary | ICD-10-CM | POA: Diagnosis not present

## 2018-06-19 DIAGNOSIS — E785 Hyperlipidemia, unspecified: Secondary | ICD-10-CM | POA: Diagnosis present

## 2018-06-19 DIAGNOSIS — K219 Gastro-esophageal reflux disease without esophagitis: Secondary | ICD-10-CM | POA: Diagnosis present

## 2018-06-19 DIAGNOSIS — Z880 Allergy status to penicillin: Secondary | ICD-10-CM

## 2018-06-19 DIAGNOSIS — Z79890 Hormone replacement therapy (postmenopausal): Secondary | ICD-10-CM

## 2018-06-19 DIAGNOSIS — K449 Diaphragmatic hernia without obstruction or gangrene: Secondary | ICD-10-CM | POA: Diagnosis present

## 2018-06-19 DIAGNOSIS — Z885 Allergy status to narcotic agent status: Secondary | ICD-10-CM

## 2018-06-19 DIAGNOSIS — Z66 Do not resuscitate: Secondary | ICD-10-CM | POA: Diagnosis not present

## 2018-06-19 DIAGNOSIS — Z7189 Other specified counseling: Secondary | ICD-10-CM | POA: Diagnosis not present

## 2018-06-19 DIAGNOSIS — R402332 Coma scale, best motor response, abnormal, at arrival to emergency department: Secondary | ICD-10-CM | POA: Diagnosis present

## 2018-06-19 DIAGNOSIS — Z87442 Personal history of urinary calculi: Secondary | ICD-10-CM | POA: Diagnosis not present

## 2018-06-19 DIAGNOSIS — Z823 Family history of stroke: Secondary | ICD-10-CM

## 2018-06-19 DIAGNOSIS — E039 Hypothyroidism, unspecified: Secondary | ICD-10-CM | POA: Diagnosis present

## 2018-06-19 DIAGNOSIS — Z9114 Patient's other noncompliance with medication regimen: Secondary | ICD-10-CM

## 2018-06-19 DIAGNOSIS — Z4659 Encounter for fitting and adjustment of other gastrointestinal appliance and device: Secondary | ICD-10-CM | POA: Diagnosis not present

## 2018-06-19 DIAGNOSIS — G9340 Encephalopathy, unspecified: Secondary | ICD-10-CM

## 2018-06-19 DIAGNOSIS — R402122 Coma scale, eyes open, to pain, at arrival to emergency department: Secondary | ICD-10-CM | POA: Diagnosis present

## 2018-06-19 DIAGNOSIS — J9601 Acute respiratory failure with hypoxia: Secondary | ICD-10-CM | POA: Diagnosis present

## 2018-06-19 DIAGNOSIS — Z20828 Contact with and (suspected) exposure to other viral communicable diseases: Secondary | ICD-10-CM | POA: Diagnosis present

## 2018-06-19 DIAGNOSIS — F19939 Other psychoactive substance use, unspecified with withdrawal, unspecified: Secondary | ICD-10-CM | POA: Diagnosis present

## 2018-06-19 DIAGNOSIS — Z90710 Acquired absence of both cervix and uterus: Secondary | ICD-10-CM

## 2018-06-19 DIAGNOSIS — J9600 Acute respiratory failure, unspecified whether with hypoxia or hypercapnia: Secondary | ICD-10-CM

## 2018-06-19 DIAGNOSIS — M6282 Rhabdomyolysis: Secondary | ICD-10-CM | POA: Diagnosis present

## 2018-06-19 DIAGNOSIS — Z515 Encounter for palliative care: Secondary | ICD-10-CM

## 2018-06-19 DIAGNOSIS — E538 Deficiency of other specified B group vitamins: Secondary | ICD-10-CM | POA: Diagnosis present

## 2018-06-19 DIAGNOSIS — Z888 Allergy status to other drugs, medicaments and biological substances status: Secondary | ICD-10-CM

## 2018-06-19 DIAGNOSIS — H518 Other specified disorders of binocular movement: Secondary | ICD-10-CM | POA: Diagnosis present

## 2018-06-19 DIAGNOSIS — Z8673 Personal history of transient ischemic attack (TIA), and cerebral infarction without residual deficits: Secondary | ICD-10-CM | POA: Diagnosis not present

## 2018-06-19 DIAGNOSIS — J189 Pneumonia, unspecified organism: Secondary | ICD-10-CM | POA: Diagnosis not present

## 2018-06-19 DIAGNOSIS — I10 Essential (primary) hypertension: Secondary | ICD-10-CM | POA: Diagnosis present

## 2018-06-19 DIAGNOSIS — G9341 Metabolic encephalopathy: Secondary | ICD-10-CM | POA: Diagnosis present

## 2018-06-19 DIAGNOSIS — Z79899 Other long term (current) drug therapy: Secondary | ICD-10-CM

## 2018-06-19 DIAGNOSIS — N390 Urinary tract infection, site not specified: Secondary | ICD-10-CM | POA: Diagnosis present

## 2018-06-19 DIAGNOSIS — N3001 Acute cystitis with hematuria: Secondary | ICD-10-CM

## 2018-06-19 DIAGNOSIS — R6339 Other feeding difficulties: Secondary | ICD-10-CM

## 2018-06-19 DIAGNOSIS — I2510 Atherosclerotic heart disease of native coronary artery without angina pectoris: Secondary | ICD-10-CM | POA: Diagnosis present

## 2018-06-19 DIAGNOSIS — G20 Parkinson's disease: Secondary | ICD-10-CM | POA: Diagnosis present

## 2018-06-19 DIAGNOSIS — Z7984 Long term (current) use of oral hypoglycemic drugs: Secondary | ICD-10-CM

## 2018-06-19 LAB — COMPREHENSIVE METABOLIC PANEL
ALT: 22 U/L (ref 0–44)
AST: 43 U/L — ABNORMAL HIGH (ref 15–41)
Albumin: 3.8 g/dL (ref 3.5–5.0)
Alkaline Phosphatase: 83 U/L (ref 38–126)
Anion gap: 16 — ABNORMAL HIGH (ref 5–15)
BUN: 30 mg/dL — ABNORMAL HIGH (ref 8–23)
CO2: 18 mmol/L — ABNORMAL LOW (ref 22–32)
Calcium: 9.6 mg/dL (ref 8.9–10.3)
Chloride: 106 mmol/L (ref 98–111)
Creatinine, Ser: 1.16 mg/dL — ABNORMAL HIGH (ref 0.44–1.00)
GFR calc Af Amer: 49 mL/min — ABNORMAL LOW (ref >60)
GFR calc non Af Amer: 43 mL/min — ABNORMAL LOW (ref >60)
Glucose, Bld: 109 mg/dL — ABNORMAL HIGH (ref 70–99)
Potassium: 4 mmol/L (ref 3.5–5.1)
Sodium: 140 mmol/L (ref 135–145)
Total Bilirubin: 0.9 mg/dL (ref 0.3–1.2)
Total Protein: 7.1 g/dL (ref 6.5–8.1)

## 2018-06-19 LAB — DIFFERENTIAL
Abs Immature Granulocytes: 0.03 10*3/uL (ref 0.00–0.07)
Basophils Absolute: 0 10*3/uL (ref 0.0–0.1)
Basophils Relative: 0 %
Eosinophils Absolute: 0 10*3/uL (ref 0.0–0.5)
Eosinophils Relative: 0 %
Immature Granulocytes: 0 %
Lymphocytes Relative: 9 %
Lymphs Abs: 0.7 10*3/uL (ref 0.7–4.0)
Monocytes Absolute: 0.5 10*3/uL (ref 0.1–1.0)
Monocytes Relative: 7 %
Neutro Abs: 6.3 10*3/uL (ref 1.7–7.7)
Neutrophils Relative %: 84 %

## 2018-06-19 LAB — ETHANOL: Alcohol, Ethyl (B): <10 mg/dL (ref <10)

## 2018-06-19 LAB — CBC
HCT: 45.7 % (ref 36.0–46.0)
Hemoglobin: 14.3 g/dL (ref 12.0–15.0)
MCH: 29.4 pg (ref 26.0–34.0)
MCHC: 31.3 g/dL (ref 30.0–36.0)
MCV: 93.8 fL (ref 80.0–100.0)
Platelets: 195 10*3/uL (ref 150–400)
RBC: 4.87 MIL/uL (ref 3.87–5.11)
RDW: 14.2 % (ref 11.5–15.5)
WBC: 7.6 10*3/uL (ref 4.0–10.5)
nRBC: 0 % (ref 0.0–0.2)

## 2018-06-19 LAB — PROTIME-INR
INR: 1 (ref 0.8–1.2)
Prothrombin Time: 13.5 seconds (ref 11.4–15.2)

## 2018-06-19 LAB — VITAMIN B12: Vitamin B-12: 101 pg/mL — ABNORMAL LOW (ref 180–914)

## 2018-06-19 LAB — BLOOD GAS, ARTERIAL
Acid-base deficit: 4.9 mmol/L — ABNORMAL HIGH (ref 0.0–2.0)
Bicarbonate: 21 mmol/L (ref 20.0–28.0)
FIO2: 100
O2 Saturation: 99.8 %
Patient temperature: 36.4
pCO2 arterial: 30 mmHg — ABNORMAL LOW (ref 32.0–48.0)
pH, Arterial: 7.415 (ref 7.350–7.450)
pO2, Arterial: 407 mmHg — ABNORMAL HIGH (ref 83.0–108.0)

## 2018-06-19 LAB — TROPONIN I
Troponin I: 0.05 ng/mL (ref <0.03)
Troponin I: 0.06 ng/mL (ref <0.03)

## 2018-06-19 LAB — URINALYSIS, ROUTINE W REFLEX MICROSCOPIC
Bilirubin Urine: NEGATIVE
Glucose, UA: NEGATIVE mg/dL
Ketones, ur: 20 mg/dL — AB
Nitrite: POSITIVE — AB
Protein, ur: 30 mg/dL — AB
Specific Gravity, Urine: 1.023 (ref 1.005–1.030)
pH: 5 (ref 5.0–8.0)

## 2018-06-19 LAB — RAPID URINE DRUG SCREEN, HOSP PERFORMED
Amphetamines: NOT DETECTED
Barbiturates: NOT DETECTED
Benzodiazepines: NOT DETECTED
Cocaine: NOT DETECTED
Opiates: NOT DETECTED
Tetrahydrocannabinol: NOT DETECTED

## 2018-06-19 LAB — GLUCOSE, CAPILLARY
Glucose-Capillary: 103 mg/dL — ABNORMAL HIGH (ref 70–99)
Glucose-Capillary: 144 mg/dL — ABNORMAL HIGH (ref 70–99)

## 2018-06-19 LAB — CK: Total CK: 1065 U/L — ABNORMAL HIGH (ref 38–234)

## 2018-06-19 LAB — LACTIC ACID, PLASMA: Lactic Acid, Venous: 1.7 mmol/L (ref 0.5–1.9)

## 2018-06-19 LAB — TSH: TSH: 0.462 u[IU]/mL (ref 0.350–4.500)

## 2018-06-19 LAB — MRSA PCR SCREENING: MRSA by PCR: NEGATIVE

## 2018-06-19 LAB — SARS CORONAVIRUS 2 BY RT PCR (HOSPITAL ORDER, PERFORMED IN ~~LOC~~ HOSPITAL LAB): SARS Coronavirus 2: NEGATIVE

## 2018-06-19 LAB — MAGNESIUM: Magnesium: 2.3 mg/dL (ref 1.7–2.4)

## 2018-06-19 LAB — APTT: aPTT: 26 seconds (ref 24–36)

## 2018-06-19 LAB — CBG MONITORING, ED: Glucose-Capillary: 79 mg/dL (ref 70–99)

## 2018-06-19 MED ORDER — FENTANYL CITRATE (PF) 100 MCG/2ML IJ SOLN: 25.0000 ug | INTRAMUSCULAR | Status: DC | PRN

## 2018-06-19 MED ORDER — ROCURONIUM BROMIDE 50 MG/5ML IV SOLN
80.0000 mg | Freq: Once | INTRAVENOUS | Status: AC
  Administered 2018-06-19: 80 mg via INTRAVENOUS

## 2018-06-19 MED ORDER — FENTANYL CITRATE (PF) 100 MCG/2ML IJ SOLN
25.0000 ug | INTRAMUSCULAR | Status: DC | PRN
  Administered 2018-06-23 – 2018-06-28 (×6): 100 ug via INTRAVENOUS
  Filled 2018-06-19 (×7): qty 2

## 2018-06-19 MED ORDER — METOPROLOL TARTRATE 5 MG/5ML IV SOLN
2.5000 mg | Freq: Once | INTRAVENOUS | Status: AC
  Administered 2018-06-19: 2.5 mg via INTRAVENOUS
  Filled 2018-06-19: qty 5

## 2018-06-19 MED ORDER — SODIUM CHLORIDE 0.9 % IV BOLUS
1000.0000 mL | Freq: Once | INTRAVENOUS | Status: AC
  Administered 2018-06-19: 1000 mL via INTRAVENOUS

## 2018-06-19 MED ORDER — ONDANSETRON HCL 4 MG/2ML IJ SOLN: 4.0000 mg | Freq: Four times a day (QID) | INTRAMUSCULAR | Status: DC | PRN

## 2018-06-19 MED ORDER — LEVETIRACETAM IN NACL 1000 MG/100ML IV SOLN
1000.0000 mg | Freq: Once | INTRAVENOUS | Status: AC
  Administered 2018-06-19: 1000 mg via INTRAVENOUS
  Filled 2018-06-19: qty 100

## 2018-06-19 MED ORDER — LORAZEPAM 2 MG/ML IJ SOLN
1.0000 mg | Freq: Once | INTRAMUSCULAR | Status: AC
  Administered 2018-06-19: 16:00:00 1 mg via INTRAVENOUS
  Filled 2018-06-19: qty 1

## 2018-06-19 MED ORDER — INSULIN ASPART 100 UNIT/ML ~~LOC~~ SOLN
0.0000 [IU] | SUBCUTANEOUS | Status: DC
  Administered 2018-06-19 – 2018-06-21 (×6): 1 [IU] via SUBCUTANEOUS
  Administered 2018-06-22 (×2): 2 [IU] via SUBCUTANEOUS
  Administered 2018-06-22 (×2): 1 [IU] via SUBCUTANEOUS
  Administered 2018-06-22 (×2): 2 [IU] via SUBCUTANEOUS
  Administered 2018-06-23: 1 [IU] via SUBCUTANEOUS
  Administered 2018-06-23 (×3): 2 [IU] via SUBCUTANEOUS
  Administered 2018-06-23: 1 [IU] via SUBCUTANEOUS
  Administered 2018-06-24 (×2): 3 [IU] via SUBCUTANEOUS
  Administered 2018-06-24: 5 [IU] via SUBCUTANEOUS
  Administered 2018-06-24: 3 [IU] via SUBCUTANEOUS
  Administered 2018-06-24: 09:00:00 2 [IU] via SUBCUTANEOUS
  Administered 2018-06-24: 3 [IU] via SUBCUTANEOUS
  Administered 2018-06-25: 5 [IU] via SUBCUTANEOUS
  Administered 2018-06-25 (×5): 3 [IU] via SUBCUTANEOUS
  Administered 2018-06-26: 5 [IU] via SUBCUTANEOUS
  Administered 2018-06-26: 13:00:00 1 [IU] via SUBCUTANEOUS
  Administered 2018-06-26 (×3): 2 [IU] via SUBCUTANEOUS
  Administered 2018-06-26 – 2018-06-27 (×2): 1 [IU] via SUBCUTANEOUS
  Administered 2018-06-27 (×3): 2 [IU] via SUBCUTANEOUS
  Administered 2018-06-27: 3 [IU] via SUBCUTANEOUS
  Administered 2018-06-27: 2 [IU] via SUBCUTANEOUS
  Administered 2018-06-28: 3 [IU] via SUBCUTANEOUS

## 2018-06-19 MED ORDER — SODIUM CHLORIDE 0.9 % IV SOLN
1.0000 g | INTRAVENOUS | Status: AC
  Administered 2018-06-20 – 2018-06-23 (×4): 1 g via INTRAVENOUS
  Filled 2018-06-19 (×4): qty 10

## 2018-06-19 MED ORDER — ORAL CARE MOUTH RINSE
15.0000 mL | OROMUCOSAL | Status: DC
  Administered 2018-06-19 – 2018-06-28 (×81): 15 mL via OROMUCOSAL

## 2018-06-19 MED ORDER — SODIUM CHLORIDE 0.9 % IV BOLUS
500.0000 mL | Freq: Once | INTRAVENOUS | Status: AC
  Administered 2018-06-19: 500 mL via INTRAVENOUS

## 2018-06-19 MED ORDER — LORAZEPAM 2 MG/ML IJ SOLN
1.0000 mg | Freq: Once | INTRAMUSCULAR | Status: AC
  Administered 2018-06-19: 1 mg via INTRAVENOUS
  Filled 2018-06-19: qty 1

## 2018-06-19 MED ORDER — SODIUM CHLORIDE 0.9 % IV SOLN
1.0000 g | Freq: Once | INTRAVENOUS | Status: AC
  Administered 2018-06-19: 1 g via INTRAVENOUS
  Filled 2018-06-19: qty 10

## 2018-06-19 MED ORDER — ACETAMINOPHEN 325 MG PO TABS
650.0000 mg | ORAL_TABLET | ORAL | Status: DC | PRN
  Administered 2018-06-23 – 2018-06-26 (×7): 650 mg via ORAL
  Filled 2018-06-19 (×7): qty 2

## 2018-06-19 MED ORDER — FAMOTIDINE IN NACL 20-0.9 MG/50ML-% IV SOLN
20.0000 mg | Freq: Two times a day (BID) | INTRAVENOUS | Status: DC
  Administered 2018-06-19 – 2018-06-20 (×3): 20 mg via INTRAVENOUS
  Filled 2018-06-19 (×3): qty 50

## 2018-06-19 MED ORDER — CHLORHEXIDINE GLUCONATE 0.12% ORAL RINSE (MEDLINE KIT)
15.0000 mL | Freq: Two times a day (BID) | OROMUCOSAL | Status: DC
  Administered 2018-06-19 – 2018-06-28 (×17): 15 mL via OROMUCOSAL

## 2018-06-19 MED ORDER — ETOMIDATE 2 MG/ML IV SOLN
10.0000 mg | Freq: Once | INTRAVENOUS | Status: AC
  Administered 2018-06-19: 10 mg via INTRAVENOUS

## 2018-06-19 MED ORDER — LEVETIRACETAM IN NACL 1000 MG/100ML IV SOLN: 1000.0000 mg | Freq: Three times a day (TID) | INTRAVENOUS | Status: DC

## 2018-06-19 MED ORDER — CARBIDOPA-LEVODOPA 25-100 MG PO TABS
1.0000 | ORAL_TABLET | Freq: Three times a day (TID) | ORAL | Status: DC
  Administered 2018-06-21 – 2018-06-23 (×6): 1 via ORAL
  Filled 2018-06-19 (×7): qty 1

## 2018-06-19 MED ORDER — FENTANYL 2500MCG IN NS 250ML (10MCG/ML) PREMIX INFUSION
50.0000 ug/h | INTRAVENOUS | Status: DC
  Administered 2018-06-19: 40 ug/h via INTRAVENOUS
  Filled 2018-06-19: qty 250

## 2018-06-19 MED ORDER — LEVETIRACETAM IN NACL 500 MG/100ML IV SOLN
500.0000 mg | Freq: Two times a day (BID) | INTRAVENOUS | Status: DC
  Administered 2018-06-20 – 2018-06-27 (×16): 500 mg via INTRAVENOUS
  Filled 2018-06-19 (×16): qty 100

## 2018-06-19 MED ORDER — CYANOCOBALAMIN 1000 MCG/ML IJ SOLN
1000.0000 ug | Freq: Once | INTRAMUSCULAR | Status: AC
  Administered 2018-06-19: 20:00:00 1000 ug via INTRAMUSCULAR
  Filled 2018-06-19: qty 1

## 2018-06-19 MED ORDER — ASPIRIN 300 MG RE SUPP
300.0000 mg | Freq: Once | RECTAL | Status: AC
  Administered 2018-06-19: 300 mg via RECTAL
  Filled 2018-06-19: qty 1

## 2018-06-19 MED ORDER — SODIUM CHLORIDE 0.9 % IV SOLN
INTRAVENOUS | Status: DC
  Administered 2018-06-19 – 2018-06-21 (×5): via INTRAVENOUS

## 2018-06-19 MED ORDER — CHLORHEXIDINE GLUCONATE CLOTH 2 % EX PADS
6.0000 | MEDICATED_PAD | Freq: Every day | CUTANEOUS | Status: DC
  Administered 2018-06-20 – 2018-06-28 (×8): 6 via TOPICAL

## 2018-06-19 MED ORDER — ASPIRIN 325 MG PO TABS: 325.0000 mg | ORAL_TABLET | Freq: Every day | ORAL | Status: DC

## 2018-06-19 NOTE — ED Triage Notes (Signed)
Pt was last seen normal 2 days ago. Family states they thought she was resting in bed. Pt is posturing with any stimulation. History of Parkinsons, TIA. Sats in 70's. Pt on non rebreather. Pt deviated to the right. Pt does NOT have a DNR.

## 2018-06-19 NOTE — ED Notes (Signed)
10 Etomidate in at 1204 80 Rocuronium in at 1204

## 2018-06-19 NOTE — Progress Notes (Signed)
CRITICAL VALUE ALERT  Critical Value:  Troponin 0.06  Date & Time Notied:  06/29/2018, 2235  Provider Notified: Blount  Orders Received/Actions taken:

## 2018-06-19 NOTE — ED Provider Notes (Signed)
[facility] EMERGENCY DEPARTMENT Provider Note   CSN: [id number] Arrival date & time: 07/04/2018  1144    History   Chief Complaint Chief Complaint  Patient presents with   Unresponsive    HPI MELLISSA CONLEY is a 85 y.o. female.     Patient presents unresponsive.  Per family she was at her home they thought she was sleeping since last night.  Per report they checked on her and she was still in deep sleep however unresponsive and arms are flexed.  Patient has a history of Parkinson's, high blood pressure, TIA is on Plavix.  Unable to get any details from patient due to presentation and no family at bedside/no contact numbers at this time.  Last known normal yesterday.     Past Medical History:  Diagnosis Date   Anemia    Normocytic; with thrombocytopenia   Arteriosclerotic cardiovascular disease (ASCVD) 1994   1994-PCI to RCA and LAD; nonsustained ventricular tachycardia;cath in 11/06-30% left main, 50% proximal RCA, normal EF; Echo in 06/2010-mild LVH, hyperdynamic LV function   Diabetes mellitus, type 2 (HCC)    Gastroesophageal reflux disease    Dysmotility, hiatal hernia; Schatzki's ring-dilated in 02/2010   Hyperlipidemia    Hypertension    Hypothyroid    Hyperthyroidism secondary to excessive replacement   Nephrolithiasis    Obesity    Parkinson disease ([place])    PSVT (paroxysmal supraventricular tachycardia) ([place]) 2006   S/p RFA-2006   TIA (transient ischemic attack)    Tobacco abuse, in remission    50 pack years   UTI (lower urinary tract infection)     Patient Active Problem List   Diagnosis Date Noted   Acute encephalopathy 06/20/2018   Acute respiratory failure with hypoxia ([place]) 06/20/2018   Rhabdomyolysis 06/20/2018   Unresponsiveness 07/10/2018   Stenosis of left carotid artery 05/22/2017   Coronary artery disease involving native coronary artery of native heart without angina pectoris 05/22/2017   Pain in both upper extremities  05/22/2017   Dizziness 10/20/2014   Pre-syncope 11/20/2012   Acute renal failure ([place]) 11/20/2012   Lower urinary tract infectious disease 11/20/2012   Orthostatic hypotension 02/20/2012   Diarrhea 05/15/2011   Arteriosclerotic cardiovascular disease (ASCVD)    Gastroesophageal reflux disease    Hypothyroid    Anemia    PSVT (paroxysmal supraventricular tachycardia) ([place])    Tobacco abuse, in remission    TIA (transient ischemic attack)    Hyperlipidemia    Parkinsonian syndrome with idiopathic orthostatic hypotension ([place]) 08/04/2010   Hypertension    Diabetes mellitus     Past Surgical History:  Procedure Laterality Date   APPENDECTOMY     KIDNEY STONE SURGERY     LUMBAR SPINE SURGERY  2007   L2-3 and L3-4 decompressive laminotomies and foraminotomies   RADIOFREQUENCY ABLATION  2006   atrial tachycardia Dr.Gregg Lovena Le   TOTAL ABDOMINAL HYSTERECTOMY       OB History   No obstetric history on file.      Home Medications    Prior to Admission medications   Medication Sig Start Date End Date Taking? Authorizing Provider  acetaminophen (TYLENOL) 650 MG CR tablet Take 650 mg by mouth 2 (two) times daily as needed for pain.   Yes [provider]  carbidopa-levodopa (SINEMET IR) 10-100 MG tablet Take 1 Table tby mouth 3 Times Daily As Directed for Parkinson's Disease 05/21/16  Yes [provider]  clopidogrel (PLAVIX) 75 MG tablet Take 75 mg by mouth at  bedtime.    Yes [provider]  furosemide (LASIX) 40 MG tablet 40 mg. Take 1/2tablet by mouth twice daily 06/16/14  Yes [provider]  glipiZIDE (GLUCOTROL XL) 10 MG 24 hr tablet TAKE  (1)  TABLET BY MOUTH TWICE A DAY. 04/25/15  Yes [provider]  levothyroxine (SYNTHROID) 100 MCG tablet Take 1 tablet by mouth daily. 05/01/18  Yes [provider]  linagliptin (TRADJENTA) 5 MG TABS tablet Take 5 mg by mouth daily.  06/07/16  Yes [provider]   lisinopril (ZESTRIL) 20 MG tablet Take 1 tablet by mouth daily. 04/15/18  Yes [provider]  pramipexole (MIRAPEX) 0.75 MG tablet Take 0.75 mg by mouth 3 (three) times daily.   Yes [provider]  pravastatin (PRAVACHOL) 40 MG tablet Take 1 tablet by mouth daily. 10/17/14  Yes [provider]  pregabalin (LYRICA) 100 MG capsule Take 100 mg by mouth 3 (three) times daily.  05/21/16  Yes [provider]  selegiline (ELDEPRYL) 5 MG capsule Take 5 mg by mouth 2 (two) times daily.  05/20/17  Yes [provider]  Vitamin D, Ergocalciferol, (DRISDOL) 50000 UNITS CAPS capsule Take 1 capsule by mouth once a week. 10/13/14  Yes [provider]  tiZANidine (ZANAFLEX) 4 MG tablet 1/2 or 1 Tablet by mout Up To 4 Times Per Day for Muscle Spasms. May Cause Drowsiness 05/21/16   [provider]    Family History Family History  Problem Relation Age of Onset   Coronary artery disease Father    Stroke Father    Coronary artery disease Mother        also a sibling   Heart disease Mother    Heart disease Sister    Heart disease Sister    Heart disease Sister    Heart disease Sister    Heart disease Sister    Heart disease Sister    Heart disease Brother    Heart disease Brother    Heart disease Brother    Heart disease Brother    Heart disease Brother    Healthy Daughter     Social History Social History   Tobacco Use   Smoking status: Former Smoker    Packs/day: 1.00    Years: 50.00    Pack years: 50.00    Types: Cigarettes    Last attempt to quit: 05/14/2008    Years since quitting: 10.1   Smokeless tobacco: Never Used   Tobacco comment: Patient smokes 1 to 1 1/2 a day  Substance Use Topics   Alcohol use: No    Alcohol/week: 0.0 standard drinks   Drug use: No     Allergies   Ace inhibitors; Codeine; Penicillins; and Ropinirole hcl   Review of Systems Review of Systems  Unable to perform ROS: Patient  unresponsive     Physical Exam Updated Vital Signs BP (!) 132/45    Pulse 82    Temp 99.9 F (37.7 C)    Resp 18    Ht '5\' 3"'$  (1.6 m)    Wt 60.4 kg    SpO2 98%    BMI 23.59 kg/m   Physical Exam Vitals signs and nursing note reviewed.  Constitutional:      Appearance: She is well-developed.  HENT:     Head: Normocephalic and atraumatic.     Comments: Dry mm Eyes:     General:        Right eye: No discharge.  Left eye: No discharge.     Conjunctiva/sclera: Conjunctivae normal.  Neck:     Musculoskeletal: Neck supple.     Trachea: No tracheal deviation.  Cardiovascular:     Rate and Rhythm: Tachycardia present.  Pulmonary:     Comments: Decreased effort Abdominal:     General: There is no distension.     Palpations: Abdomen is soft.     Tenderness: There is no abdominal tenderness. There is no guarding.  Skin:    General: Skin is warm.     Findings: No rash.  Neurological:     Mental Status: She is unresponsive.     GCS: GCS eye subscore is 2. GCS verbal subscore is 1. GCS motor subscore is 3.     Comments: Gaze to right, upgoing toes bilateral, will not follow commands, pupils 2 mm bilateral, challenging exam, arms flexed bilateral.    Psychiatric:     Comments: unresponsive      ED Treatments / Results  5 bicarb 18 labs (all labs ordered are listed, but only abnormal results are displayed) Labs Reviewed  COMPREHENSIVE METABOLIC PANEL - Abnormal; Notable for the following components:      Result Value   CO2 18 (*)    Glucose, Bld 109 (*)    BUN 30 (*)    Creatinine, Ser 1.16 (*)    AST 43 (*)    GFR calc non Af Amer 43 (*)    GFR calc Af Amer 49 (*)    Anion gap 16 (*)    All other components within normal limits  URINALYSIS, ROUTINE W REFLEX MICROSCOPIC - Abnormal; Notable for the following components:   Color, Urine AMBER (*)    APPearance CLOUDY (*)    Hgb urine dipstick MODERATE (*)    Ketones, ur 20 (*)    Protein, ur 30 (*)    Nitrite  POSITIVE (*)    Leukocytes,Ua MODERATE (*)    Bacteria, UA MANY (*)    All other components within normal limits  BLOOD GAS, ARTERIAL - Abnormal; Notable for the following components:   pCO2 arterial 30.0 (*)    pO2, Arterial 407 (*)    Acid-base deficit 4.9 (*)    All other components within normal limits  CK - Abnormal; Notable for the following components:   Total CK 1,065 (*)    All other components within normal limits  TROPONIN I - Abnormal; Notable for the following components:   Troponin I 0.05 (*)    All other components within normal limits  TROPONIN I - Abnormal; Notable for the following components:   Troponin I 0.06 (*)    All other components within normal limits  TROPONIN I - Abnormal; Notable for the following components:   Troponin I 0.06 (*)    All other components within normal limits  VITAMIN B12 - Abnormal; Notable for the following components:   Vitamin B-12 101 (*)    All other components within normal limits  CBC - Abnormal; Notable for the following components:   RBC 3.60 (*)    Hemoglobin 10.8 (*)    HCT 34.0 (*)    All other components within normal limits  BASIC METABOLIC PANEL - Abnormal; Notable for the following components:   Chloride 113 (*)    CO2 16 (*)    Glucose, Bld 140 (*)    BUN 30 (*)    Creatinine, Ser 1.21 (*)    Calcium 8.7 (*)    GFR calc  non Af Amer 40 (*)    GFR calc Af Amer 47 (*)    All other components within normal limits  BLOOD GAS, ARTERIAL - Abnormal; Notable for the following components:   pO2, Arterial 146 (*)    Acid-base deficit 3.1 (*)    All other components within normal limits  CK - Abnormal; Notable for the following components:   Total CK 721 (*)    All other components within normal limits  LIPID PANEL - Abnormal; Notable for the following components:   Triglycerides 172 (*)    HDL 34 (*)    All other components within normal limits  GLUCOSE, CAPILLARY - Abnormal; Notable for the following components:    Glucose-Capillary 144 (*)    All other components within normal limits  GLUCOSE, CAPILLARY - Abnormal; Notable for the following components:   Glucose-Capillary 103 (*)    All other components within normal limits  GLUCOSE, CAPILLARY - Abnormal; Notable for the following components:   Glucose-Capillary 132 (*)    All other components within normal limits  GLUCOSE, CAPILLARY - Abnormal; Notable for the following components:   Glucose-Capillary 103 (*)    All other components within normal limits  SARS CORONAVIRUS 2 (HOSPITAL ORDER, [place] [facility])  MRSA PCR SCREENING  CULTURE, BLOOD (ROUTINE X 2)  CULTURE, BLOOD (ROUTINE X 2)  URINE CULTURE  ETHANOL  PROTIME-INR  APTT  CBC  DIFFERENTIAL  RAPID URINE DRUG SCREEN, HOSP PERFORMED  TSH  T4  MAGNESIUM  LACTIC ACID, PLASMA  AMMONIA  FOLATE  FOLATE RBC  HEMOGLOBIN A1C  HEMOGLOBIN A1C  CBG MONITORING, ED    EKG EKG Interpretation  Date/Time:  Thursday Jun 19 2018 12:18:04 EDT Ventricular Rate:  109 PR Interval:    QRS Duration: 97 QT Interval:  348 QTC Calculation: 469 R Axis:   35 Text Interpretation:  Sinus tachycardia Probable left atrial enlargement Borderline repolarization abnormality Confirmed by Orpah Greek [phone]) on 06/20/2018 6:59:31 AM   Radiology Ct Head Wo Contrast  Result Date: 07/08/2018 CLINICAL DATA:  85 year old female with a history of altered mental status EXAM: CT HEAD WITHOUT CONTRAST TECHNIQUE: Contiguous axial images were obtained from the base of the skull through the vertex without intravenous contrast. COMPARISON:  12/15/2012 FINDINGS: Brain: No acute intracranial hemorrhage. No midline shift or mass effect. Gray-white differentiation maintained. Progression of senescent volume loss. Patchy hypodensities in the periventricular white matter. Unremarkable appearance of the ventricular system. Vascular: Dense calcifications of anterior and posterior circulation. Skull:  No acute fracture.  No aggressive bone lesion identified. Sinuses/Orbits: Unremarkable appearance of the orbits. Mastoid air cells clear. No middle ear effusion. No significant sinus disease. Other: Endotracheal tube in position.  Fluid in the nasopharynx IMPRESSION: Negative for acute intracranial abnormality. Senescent volume loss and evidence of chronic microvascular ischemic disease. Electronically Signed   By: Corrie Mckusick D.O.   On: 07/02/2018 14:11   Dg Chest Port 1 View  Result Date: 06/20/2018 CLINICAL DATA:  85 year old female with repositioning of the endotracheal tube EXAM: PORTABLE CHEST 1 VIEW COMPARISON:  06/20/2018 FINDINGS: Cardiomediastinal silhouette unchanged in size and contour. No evidence of central vascular congestion. The endotracheal tube has been slightly withdrawn, now terminating 2.7 cm above the carina. There is a linear contour of the right lung of uncertain significance. There are overlying surgical lines tubes in EKG leads of the right chest somewhat obscuring evaluation. Low lung volumes. Pleuroparenchymal opacity of the right lung, may represent chronic  changes and some pleural fluid. No new airspace disease. IMPRESSION: Endotracheal tube has been slightly withdrawn now terminating 2.7 cm above the carina. There is linear contour of the right lung extending in a convex configuration towards the costophrenic angle. While this is favored to represent artifact, a right pneumothorax cannot be excluded. Correlation with physical exam may be useful, as well as a repeat chest x-ray with adequate positioning and removal of the obscuring surgical line/hardware and EKG leads. These results were discussed by telephone at the time of interpretation on 06/20/2018 at 9:21 am with the nurse caring for the patient, Ms Sherry Ruffing. Electronically Signed   By: Corrie Mckusick D.O.   On: 06/20/2018 09:21   Dg Chest Port 1 View  Result Date: 06/20/2018 CLINICAL DATA:  Unresponsive. EXAM:  PORTABLE CHEST 1 VIEW COMPARISON:  Radiograph Jun 19, 2018. FINDINGS: The heart size and mediastinal contours are within normal limits. Atherosclerosis of thoracic aorta is noted. Endotracheal tube is directed into right mainstem bronchus; withdrawal by 3-4 cm is recommended. Both lungs are clear. The visualized skeletal structures are unremarkable. IMPRESSION: Endotracheal tube directed into right mainstem bronchus; withdrawal by 3-4 cm is recommended. No acute cardiopulmonary abnormality seen. These results will be called to the ordering clinician or representative by the Radiologist Assistant, and communication documented in the PACS or zVision Dashboard. Electronically Signed   By: Marijo Conception M.D.   On: 06/20/2018 08:16   Dg Chest Portable 1 View  Result Date: 06/29/2018 CLINICAL DATA:  Unresponsive. EXAM: PORTABLE CHEST 1 VIEW COMPARISON:  06/27/2010 FINDINGS: 1301 hours. Endotracheal tube tip is approximately 1.4 cm above the base of the carina. The lungs are clear without focal pneumonia, edema, pneumothorax or pleural effusion. The cardiopericardial silhouette is within normal limits for size. The visualized bony structures of the thorax are intact. Telemetry leads overlie the chest. IMPRESSION: No active disease. Electronically Signed   By: Misty Stanley M.D.   On: 06/14/2018 13:23   Dg Chest Port 1v Same Day  Result Date: 06/20/2018 CLINICAL DATA:  Intubated patient. Possible pneumothorax on plain film of the chest earlier today. EXAM: PORTABLE CHEST 1 VIEW COMPARISON:  Single-view of the chest earlier today. FINDINGS: Endotracheal tube is in place with the tip well above the carina. There is no pneumothorax. Finding described on the prior report was likely a skin fold. Lungs clear. Heart size normal. IMPRESSION: Negative for pneumothorax.  No acute disease. ETT in good position. Electronically Signed   By: Inge Rise M.D.   On: 06/20/2018 10:16    Procedures .Critical Care Performed  by: Elnora Morrison, MD Authorized by: Elnora Morrison, MD   Critical care provider statement:    Critical care time (minutes):  75   Critical care start time:  06/29/2018 12:00 PM   Critical care end time:  06/27/2018 1:15 PM   Critical care time was exclusive of:  Separately billable procedures and treating other patients and teaching time   Critical care was necessary to treat or prevent imminent or life-threatening deterioration of the following conditions:  CNS failure or compromise and respiratory failure   Critical care was time spent personally by me on the following activities:  Discussions with consultants, evaluation of patient's response to treatment, examination of patient, ordering and performing treatments and interventions, ordering and review of laboratory studies, ordering and review of radiographic studies, pulse oximetry, re-evaluation of patient's condition, obtaining history from patient or surrogate and review of old charts  Procedure Name: Intubation  Date/Time: 06/20/2018 10:48 AM Performed by: Elnora Morrison, MD Pre-anesthesia Checklist: Patient identified, Patient being monitored, Emergency Drugs available, Timeout performed and Suction available Oxygen Delivery Method: Non-rebreather mask Preoxygenation: Pre-oxygenation with 100% oxygen Induction Type: Rapid sequence Ventilation: Mask ventilation without difficulty Laryngoscope Size: Glidescope and 3 Grade View: Grade II Tube size: 7.5 mm Number of attempts: 1 Placement Confirmation: ETT inserted through vocal cords under direct vision,  CO2 detector and Breath sounds checked- equal and bilateral      (including critical care time)  Medications Ordered in ED Medications  famotidine (PEPCID) IVPB 20 mg premix (20 mg Intravenous New Bag/Given 06/20/18 0934)  0.9 %  sodium chloride infusion ( Intravenous Rate/Dose Verify 06/20/18 0840)  acetaminophen (TYLENOL) tablet 650 mg (has no administration in time range)    ondansetron (ZOFRAN) injection 4 mg (has no administration in time range)  insulin aspart (novoLOG) injection 0-9 Units (0 Units Subcutaneous Not Given 06/20/18 0747)  cefTRIAXone (ROCEPHIN) 1 g in sodium chloride 0.9 % 100 mL IVPB (has no administration in time range)  fentaNYL (SUBLIMAZE) injection 25 mcg (has no administration in time range)  fentaNYL (SUBLIMAZE) injection 25-100 mcg (has no administration in time range)  chlorhexidine gluconate (MEDLINE KIT) (PERIDEX) 0.12 % solution 15 mL (15 mLs Mouth Rinse Given 06/20/18 0759)  MEDLINE mouth rinse (15 mLs Mouth Rinse Given 06/20/18 1008)  carbidopa-levodopa (SINEMET IR) 25-100 MG per tablet immediate release 1 tablet (1 tablet Oral Not Given 06/20/18 1007)  levETIRAcetam (KEPPRA) IVPB 500 mg/100 mL premix ( Intravenous Stopped 06/20/18 0812)  aspirin tablet 325 mg (325 mg Oral Not Given 06/20/18 1005)  Chlorhexidine Gluconate Cloth 2 % PADS 6 each (has no administration in time range)  levothyroxine (SYNTHROID, LEVOTHROID) injection 50 mcg (50 mcg Intravenous Given 06/20/18 0928)  aspirin suppository 300 mg (300 mg Rectal Given 06/20/18 0932)  cyanocobalamin ((VITAMIN B-12)) injection 1,000 mcg (1,000 mcg Intramuscular Given 06/20/18 0930)  etomidate (AMIDATE) injection 10 mg (10 mg Intravenous Given 07/01/2018 1204)  rocuronium (ZEMURON) injection 80 mg (80 mg Intravenous Given 06/13/2018 1204)  LORazepam (ATIVAN) injection 1 mg (1 mg Intravenous Given 06/22/2018 1311)  cefTRIAXone (ROCEPHIN) 1 g in sodium chloride 0.9 % 100 mL IVPB (0 g Intravenous Stopped 06/18/2018 1609)  sodium chloride 0.9 % bolus 1,000 mL (0 mLs Intravenous Stopped 06/28/2018 1628)  LORazepam (ATIVAN) injection 1 mg (1 mg Intravenous Given 06/13/2018 1613)  aspirin suppository 300 mg (300 mg Rectal Given 07/10/2018 1956)  cyanocobalamin ((VITAMIN B-12)) injection 1,000 mcg (1,000 mcg Intramuscular Given 06/21/2018 1956)  levETIRAcetam (KEPPRA) IVPB 1000 mg/100 mL premix (1,000 mg Intravenous New Bag/Given  07/06/2018 2012)  metoprolol tartrate (LOPRESSOR) injection 2.5 mg (2.5 mg Intravenous Given 06/17/2018 2046)  sodium chloride 0.9 % bolus 500 mL (0 mLs Intravenous Stopped 07/13/2018 2330)     Initial Impression / Assessment and Plan / ED Course  I have reviewed the triage vital signs and the nursing notes.  Pertinent labs & imaging results that were available during my care of the patient were reviewed by me and considered in my medical decision making (see chart for details).  Clinical Course as of Jun 20 1039  Thu Jun 19, 2018  1547 Discussed with Dr. Merlene Laughter from neurology who felt that the patient could be adequately cared for here at [facility]   [MB]    Clinical Course User Index [MB] Hayden Rasmussen, MD      Patient presents with clinical concern for significant stroke.  Per report patient is  a full code however no family at bedside.  EMS no updates.  Patient intubated for airway protection and to allow further evaluation of cause of patient's unresponsive state.  Patient intubated without difficulty.  Blood work sent, CT scan of the head pending, urine test pending.  Delay in CT scan, discussed with nursing to obtain urgently.    Fluid bolus ordered, CK signs concerning for mild rhabdo. Acute renal failure on blood work reviewed 1.16 creatinine, BUN elevated. Urinalysis consistent with infection, culture sent and Rocephin ordered. CT scan no acute bleeding or fracture.  Initial phone call the family unable to reach however later on I did discuss with the daughter details.  Patient's been noncompliant with medications, still wants independence and lives alone. Discussed concern for encephalopathy multifactorial and with eyes deviated to the right possible seizure versus stroke versus Parkinson's related. Prior to signout paged neurology for assistance and discussed with hospitalist for admission to the ICU. KANAYA GUNNARSON was evaluated in Emergency Department on 06/20/2018 for the  symptoms described in the history of present illness. She was evaluated in the context of the global COVID-19 pandemic, which necessitated consideration that the patient might be at risk for infection with the SARS-CoV-2 virus that causes COVID-19. Institutional protocols and algorithms that pertain to the evaluation of patients at risk for COVID-19 are in a state of rapid change based on information released by regulatory bodies including the CDC and federal and state organizations. These policies and algorithms were followed during the patient's care in the ED.   Final Clinical Impressions(s) / ED Diagnoses   Final diagnoses:  Unresponsive  Acute respiratory failure, unspecified whether with hypoxia or hypercapnia ([place])  Acute cystitis with hematuria    ED Discharge Orders    None       Elnora Morrison, MD 06/20/18 1048

## 2018-06-19 NOTE — ED Notes (Addendum)
10 Etomidate 80 Rocuronium   Ordered by Dr. Jodi Mourning

## 2018-06-19 NOTE — Progress Notes (Signed)
Patient heart rate remaining in 120's range, patient also has no OG or NG tube for PO meds in place after several attempts. MD aware.

## 2018-06-19 NOTE — Progress Notes (Signed)
Multiple attempts at OG/NG tube insertion. All attempts failed.

## 2018-06-19 NOTE — ED Notes (Addendum)
Trinna Post, PA intubated patient.  24 at the lip.  7.5 ET Tube

## 2018-06-19 NOTE — ED Notes (Signed)
CONTACT INFO:   Ashley Mariner (daughter): [phone]

## 2018-06-19 NOTE — H&P (Addendum)
History and Physical    Jennifer [name]:[id number] DOB: 05-14-1930 DOA: 07/01/2018  PCP: Joette Catching, MD   Patient coming from: Home  I have personally briefly reviewed patient's old medical records in [facility]  Chief Complaint: Unresponsiveness  HPI: Jennifer [name] is a 85 y.o. female with medical history significant for TIA, Parkinson's disease, hypothyroidism, hypertension, diabetes who was brought to the ED unresponsive.  History is obtained from chart review and EDP.  Patient was last seen normal 2 days ago.  We will attempt to contact family unsuccessful.  Both numbers listed in chart to go to voicemail.  ED Course: On arrival in the ED O2 sats was in the 70s.  Patient was on nonrebreather.  Due to mental status patient was intubated to protect her airway.  UDS was clean.  UA positive for nitrites and leukocytes, many bacteria, 21-50 WBCs.  SARS-CoV-2 test negative.  WBC 7.6, hemoglobin 14.3.  CMP with mildly low bicarb 18, anion gap 16.  CK elevated at 1065.  ABG 7, showed pH 7.4, PCO2 30, PO2 elevated at 407. Normal TSH  0.46.  Portable chest x-ray-post intubation, shows ET tube above carina otherwise no acute abnormality.  Head CT-negative for acute intracranial abnormality. Patient was started on IV ceftriaxone, hospitalist to admit for unresponsiveness.  Review of Systems: Unable to assess patient is unresponsive.  Past Medical History:  Diagnosis Date  . Anemia    Normocytic; with thrombocytopenia  . Arteriosclerotic cardiovascular disease (ASCVD) 1994   1994-PCI to RCA and LAD; nonsustained ventricular tachycardia;cath in 11/06-30% left main, 50% proximal RCA, normal EF; Echo in 06/2010-mild LVH, hyperdynamic LV function  . Diabetes mellitus, type 2 (HCC)   . Gastroesophageal reflux disease    Dysmotility, hiatal hernia; Schatzki's ring-dilated in 02/2010  . Hyperlipidemia   . Hypertension   . Hypothyroid    Hyperthyroidism secondary to excessive replacement   . Nephrolithiasis   . Obesity   . Parkinson disease (HCC)   . PSVT (paroxysmal supraventricular tachycardia) (HCC) 2006   S/p RFA-2006  . TIA (transient ischemic attack)   . Tobacco abuse, in remission    50 pack years  . UTI (lower urinary tract infection)     Past Surgical History:  Procedure Laterality Date  . APPENDECTOMY    . KIDNEY STONE SURGERY    . LUMBAR SPINE SURGERY  2007   L2-3 and L3-4 decompressive laminotomies and foraminotomies  . RADIOFREQUENCY ABLATION  2006   atrial tachycardia Dr.Gregg Ladona Ridgel  . TOTAL ABDOMINAL HYSTERECTOMY       reports that she quit smoking about 10 years ago. Her smoking use included cigarettes. She has a 50.00 pack-year smoking history. She has never used smokeless tobacco. She reports that she does not drink alcohol or use drugs.  Allergies  Allergen Reactions  . Ace Inhibitors Other (See Comments)    Hyperkalemia-mild   . Codeine Nausea And Vomiting  . Penicillins Swelling  . Ropinirole Hcl Other (See Comments)    Pt wasn't in right state of mind    Family History  Problem Relation Age of Onset  . Coronary artery disease Father   . Stroke Father   . Coronary artery disease Mother        also a sibling  . Heart disease Mother   . Heart disease Sister   . Heart disease Sister   . Heart disease Sister   . Heart disease Sister   . Heart disease Sister   .  Heart disease Sister   . Heart disease Brother   . Heart disease Brother   . Heart disease Brother   . Heart disease Brother   . Heart disease Brother   . Healthy Daughter     Prior to Admission medications   Medication Sig Start Date End Date Taking? Authorizing Provider  acetaminophen (TYLENOL) 650 MG CR tablet Take 650 mg by mouth 2 (two) times daily as needed for pain.   Yes [provider]  carbidopa-levodopa (SINEMET IR) 10-100 MG tablet Take 1 Table tby mouth 3 Times Daily As Directed for Parkinson's Disease 05/21/16  Yes [provider]   clopidogrel (PLAVIX) 75 MG tablet Take 75 mg by mouth at bedtime.    Yes [provider]  furosemide (LASIX) 40 MG tablet 40 mg. Take 1/2tablet by mouth twice daily 06/16/14  Yes [provider]  glipiZIDE (GLUCOTROL XL) 10 MG 24 hr tablet TAKE  (1)  TABLET BY MOUTH TWICE A DAY. 04/25/15  Yes [provider]  levothyroxine (SYNTHROID) 100 MCG tablet Take 1 tablet by mouth daily. 05/01/18  Yes [provider]  linagliptin (TRADJENTA) 5 MG TABS tablet Take 5 mg by mouth daily.  06/07/16  Yes [provider]  lisinopril (ZESTRIL) 20 MG tablet Take 1 tablet by mouth daily. 04/15/18  Yes [provider]  pramipexole (MIRAPEX) 0.75 MG tablet Take 0.75 mg by mouth 3 (three) times daily.   Yes [provider]  pravastatin (PRAVACHOL) 40 MG tablet Take 1 tablet by mouth daily. 10/17/14  Yes [provider]  pregabalin (LYRICA) 100 MG capsule Take 100 mg by mouth 3 (three) times daily.  05/21/16  Yes [provider]  selegiline (ELDEPRYL) 5 MG capsule Take 5 mg by mouth 2 (two) times daily.  05/20/17  Yes [provider]  Vitamin D, Ergocalciferol, (DRISDOL) 50000 UNITS CAPS capsule Take 1 capsule by mouth once a week. 10/13/14  Yes [provider]  tiZANidine (ZANAFLEX) 4 MG tablet 1/2 or 1 Tablet by mout Up To 4 Times Per Day for Muscle Spasms. May Cause Drowsiness 05/21/16   [provider]    Physical Exam: Vitals:   06/18/2018 1521 07/12/2018 1530 07/02/2018 1545 06/20/2018 1600  BP: (!) 159/80 (!) 185/70 (!) 182/68 (!) 183/69  Pulse: (!) 112 (!) 115 (!) 118 (!) 117  Resp: 19 20 (!) 27 (!) 21  Temp: 98.4 F (36.9 C) 98.6 F (37 C) 98.2 F (36.8 C) 98.2 F (36.8 C)  SpO2: 100% 100% 99% 100%  Height:        Constitutional: Intubated, unresponsive on continuous fentanyl infusion Vitals:   07/06/2018 1521 07/08/2018 1530 06/14/2018 1545 07/07/2018 1600  BP: (!) 159/80 (!) 185/70 (!) 182/68 (!) 183/69  Pulse: (!) 112  (!) 115 (!) 118 (!) 117  Resp: 19 20 (!) 27 (!) 21  Temp: 98.4 F (36.9 C) 98.6 F (37 C) 98.2 F (36.8 C) 98.2 F (36.8 C)  SpO2: 100% 100% 99% 100%  Height:       Eyes: PERRL, lids and conjunctivae normal ENMT: Intubated Neck: no masses,  Respiratory: Clear to auscultation, clear to auscultation bilaterally, no wheezing, no crackles. Cardiovascular: tachycardic, Regular rate and rhythm, no murmurs / rubs / gallops. No extremity edema. 2+ pedal pulses.   Abdomen: no tenderness, no masses palpated. No hepatosplenomegaly. Bowel sounds positive.  Musculoskeletal: no clubbing / cyanosis. No joint deformity upper and lower extremities.  Skin: no rashes, lesions, ulcers. No induration Neurologic:  Mild flexion of upper extremities to sternal rub, movement of toes of both lower extremities to pain, slight flexion of right lower extremity also to pain. GCS ~5.  Psychiatric: Intubated and unresponsive  Labs on Admission: I have personally reviewed following labs and imaging studies  CBC: Recent Labs  Lab 06/26/2018 1306  WBC 7.6  NEUTROABS 6.3  HGB 14.3  HCT 45.7  MCV 93.8  PLT 195   Basic Metabolic Panel: Recent Labs  Lab 06/28/2018 1306  NA 140  K 4.0  CL 106  CO2 18*  GLUCOSE 109*  BUN 30*  CREATININE 1.16*  CALCIUM 9.6   GFR: CrCl cannot be calculated (Unknown ideal weight.). Liver Function Tests: Recent Labs  Lab 06/13/2018 1306  AST 43*  ALT 22  ALKPHOS 83  BILITOT 0.9  PROT 7.1  ALBUMIN 3.8   Coagulation Profile: Recent Labs  Lab 06/16/2018 1306  INR 1.0   Cardiac Enzymes: Recent Labs  Lab 07/07/2018 1306  CKTOTAL 1,065*   CBG: Recent Labs  Lab 07/10/2018 1151  GLUCAP 79   Thyroid Function Tests: Recent Labs    06/20/2018 1315  TSH 0.462   Urine analysis:    Component Value Date/Time   COLORURINE AMBER (A) 06/21/2018 1213   APPEARANCEUR CLOUDY (A) 06/29/2018 1213   LABSPEC 1.023 07/03/2018 1213   PHURINE 5.0 06/25/2018 1213   GLUCOSEU NEGATIVE  07/02/2018 1213   HGBUR MODERATE (A) 06/18/2018 1213   BILIRUBINUR NEGATIVE 07/13/2018 1213   KETONESUR 20 (A) 06/18/2018 1213   PROTEINUR 30 (A) 07/04/2018 1213   UROBILINOGEN 0.2 11/20/2012 1254   NITRITE POSITIVE (A) 06/18/2018 1213   LEUKOCYTESUR MODERATE (A) 07/01/2018 1213    Radiological Exams on Admission: Ct Head Wo Contrast  Result Date: 07/03/2018 CLINICAL DATA:  85 year old female with a history of altered mental status EXAM: CT HEAD WITHOUT CONTRAST TECHNIQUE: Contiguous axial images were obtained from the base of the skull through the vertex without intravenous contrast. COMPARISON:  12/15/2012 FINDINGS: Brain: No acute intracranial hemorrhage. No midline shift or mass effect. Gray-white differentiation maintained. Progression of senescent volume loss. Patchy hypodensities in the periventricular white matter. Unremarkable appearance of the ventricular system. Vascular: Dense calcifications of anterior and posterior circulation. Skull: No acute fracture.  No aggressive bone lesion identified. Sinuses/Orbits: Unremarkable appearance of the orbits. Mastoid air cells clear. No middle ear effusion. No significant sinus disease. Other: Endotracheal tube in position.  Fluid in the nasopharynx IMPRESSION: Negative for acute intracranial abnormality. Senescent volume loss and evidence of chronic microvascular ischemic disease. Electronically Signed   By: Gilmer Mor D.O.   On: 07/09/2018 14:11   Dg Chest Portable 1 View  Result Date: 07/11/2018 CLINICAL DATA:  Unresponsive. EXAM: PORTABLE CHEST 1 VIEW COMPARISON:  06/27/2010 FINDINGS: 1301 hours. Endotracheal tube tip is approximately 1.4 cm above the base of the carina. The lungs are clear without focal pneumonia, edema, pneumothorax or pleural effusion. The cardiopericardial silhouette is within normal limits for size. The visualized bony structures of the thorax are intact. Telemetry leads overlie the chest. IMPRESSION: No active disease.  Electronically Signed   By: Kennith Center M.D.   On: 06/14/2018 13:23    EKG: Independently reviewed.  Sinus Tach- 109.  QTc 469.  No ST or T wave abnormalities compared to prior EKG.  Assessment/Plan Active Problems:   Unresponsiveness   Unresponsiveness- head CT negative for acute abnormality.  EKG unchanged, troponin mildly elevated 0.05.  Last seen normal 2 days ago.  Possibly neurologic events,  differential large stroke, seizures.  But if she had a stroke >24 hours ago and large as her presentation indicates, should be evident on CT by this time.  Possibly UTI contributing but wont account for whole presentation. Doubt cardiac event with unremarkable EKG and troponin.  Low suspicion for PE at this time, and pursuing imaging or treatment with anticoagulation would not be helpful considering poor prognosis.Marland Kitchen  SARS-CoV-2 test negative.  Normal TSH.  Per ED provider, Family requesting the patient be admitted here at [facility].  Patient intubated for airway protection - ICU ventilation protocols - NPO - X-ray, ABG in a.m. - EEG - Neurosurgery consulted by ED provider talked with Dr. Gerilyn Pilgrim who will see the patient - Continue IV ceftriaxone - Further stroke work-up pending discussion with family and neurology consult.  As prognosis is poor. - IV fentanyl intermittent for analgesia, held off on sedatives as patient is unresponsive. - Pulmonology consult to help with vent management -Troponin trend - Obtain B12, folate, -  magnesium -2.3 -Trend lactic acid-normal 1.7 -D-dimer -Aspirin 300 mg per rectum x1 -Consider NG tube if prolonged hospitalization -Poor prognosis-will need to address CODE STATUS, and aggressiveness of further care with family. - Obtain Blood cultures, f/u Urine cultures  Rhabdomyolysis-CK 1065. -Hydrate N/s 100cc/hr - CK a.m  Hypothyroidism- TSH 0.46. Home Medication Synthroid 100 mg daily -Continue home Synthroid 100 mg IV daily  Hypertension-systolic 120s  to 200s.  Home medications lisinopril, Lasix 20 mg twice daily.  In the event of possible stroke will allow for permissive hypertension. -PRN labetalol  Diabetes-random glucose 106.  Home medications glipizide, linagliptin, -Monitor glucose  Parkinson's disease, TIA-  -Hold home meds Sinemet, Pravachol, Lyrica, ??? Selegiline, tizanidine, pramipexole  DVT prophylaxis: SCDS Code Status: Full Family Communication: Multiple attempts to contact family unsuccessful. Disposition Plan: Per rounding team Consults called: Neurology Admission status: Inpt, ICU I certify that at the point of admission it is my clinical judgment that the patient will require inpatient hospital care spanning beyond 2 midnights from the point of admission due to high intensity of service, high risk for further deterioration and high frequency of surveillance required. The following factors support the patient status of inpatient: Unresponsiveness, and intubation requiring close monitoring.   Onnie Boer MD [facility]  07/03/2018, 5:41 PM

## 2018-06-19 NOTE — ED Notes (Signed)
Pt sats 100% on Non rebreather

## 2018-06-19 NOTE — ED Notes (Signed)
Dentures in a cup at bedside

## 2018-06-19 NOTE — ED Provider Notes (Signed)
Sent from Dr. Jodi Mourning.  85 year old female brought in by ambulance for unresponsiveness.  She was a full code and was intubated on arrival.  Initial work-up is demonstrated a UTI.  Family is already been contacted. Physical Exam  BP (!) 157/80   Pulse (!) 111   Temp 98.2 F (36.8 C)   Resp (!) 28   Ht 5\' 3"  (1.6 m)   SpO2 100%   BMI 30.11 kg/m   Physical Exam  ED Course/Procedures   Clinical Course as of Jun 19 1300  Thu 06-23-2018  1547 Discussed with Dr. Gerilyn Pilgrim from neurology who felt that the patient could be adequately cared for here at [facility]   [MB]    Clinical Course User Index [MB] Terrilee Files, MD    Procedures  MDM  Plan is to admit to hospitalist service.       Terrilee Files, MD 06/20/18 1302

## 2018-06-19 NOTE — Progress Notes (Signed)
Patient's BP dropping and temperature increasing, mid level aware. Orders placed and followed.

## 2018-06-19 NOTE — Consult Note (Signed)
[facility] [name] A. Gerilyn Pilgrim, MD     [email or web address]          Jennifer [name] is an 85 y.o. female.   ASSESSMENT/PLAN: 1.  Acute encephalopathy: Etiology likely multifactorial including acute UTI and possible acute ischemic stroke or seizures given the patient's eyes are with eyes deviated to the right.  The patient has been appropriately started on aspirin.  I will load her with Keppra 1 g and started maintenance dose of 500 mg twice daily.  An EEG will be obtained.  The patient is not improved tomorrow, repeat imaging with CT or MRI is recommended.  2.  Parkinson disease: The patient's medicines have been held but she needs to be on at least the Sinemet.  I will restart this to prevent acute withdrawal symptoms from this medications including neuroleptic malignant syndrome.  I will go ahead and increase the dose to 25/100    -- 3 times daily.  It is okay to hold the other 2 medications.  3.  Vitamin B-12 deficiency: This will be replaced.    The patient is a 85 year old white female who apparently was found unresponsive.  She apparently was last seen normal 2 days ago.  I did attempt to contact the family but was not able to reach them.  The history is obtained from the records of the emergency room physician and admitting physician.  Work-up is significant for acute UTI.  The patient sees 1 of the neurologist in [place] Dr. Daphane Shepherd.  The patient was last seen there in 2019.  She has been maintained on Sinemet 10/100 3 times daily, repairing all 0.75 mg 3 times daily and selegiline 5 mg twice daily.  It appears she has had some hallucinations and was to be on Seroquel as a possible treatment if no provoking etiologies were not present such as acute infections.  Review of systems is not obtainable given the mentation.    GENERAL: This is a thin lady who is intubated.  The examination is done while the patient is on fentanyl infusion.  HEENT: Neck is supple no trauma appreciated   ABDOMEN: soft  EXTREMITIES: No edema   BACK: Normal  SKIN: Normal by inspection.    MENTAL STATUS: There is no eye opening.  There is no verbal output or attempt to follow commands.  CRANIAL NERVES: Pupils are equal, round and reactive to light; eyes are deviated to the right.  Eyes deviated to the right are persistent and no nystagmus is appreciated.  Extra ocular movements are full with oculocephalic reflexes, upper and lower facial muscles are normal in strength and symmetric, there is no flattening of the nasolabial folds  MOTOR: There is mild flexion withdrawal to deep painful stimuli bilaterally.  COORDINATION: No tremor is noted.  There is mild cogwheeling and rigidity involving the left upper extremity.  REFLEXES: Deep tendon reflexes are symmetrical and normal.  Plantar responses are classically upgoing on the left and equivocal on the right.  SENSATION: Normal to pain.      Blood pressure (!) 129/38, pulse (!) 110, temperature 99.9 F (37.7 C), resp. rate 16, height  (1.6 m), SpO2 100 %.  Past Medical History:  Diagnosis Date  . Anemia    Normocytic; with thrombocytopenia  . Arteriosclerotic cardiovascular disease (ASCVD) 1994   1994-PCI to RCA and LAD; nonsustained ventricular tachycardia;cath in 11/06-30% left main, 50% proximal RCA, normal EF; Echo in 06/2010-mild LVH, hyperdynamic LV function  . Diabetes mellitus, type 2 (HCC)   .  Gastroesophageal reflux disease    Dysmotility, hiatal hernia; Schatzki's ring-dilated in 02/2010  . Hyperlipidemia   . Hypertension   . Hypothyroid    Hyperthyroidism secondary to excessive replacement  . Nephrolithiasis   . Obesity   . Parkinson disease (HCC)   . PSVT (paroxysmal supraventricular tachycardia) (HCC) 2006   S/p RFA-2006  . TIA (transient ischemic attack)   . Tobacco abuse, in remission    50 pack years  . UTI (lower urinary tract infection)     Past Surgical History:  Procedure Laterality Date  .  APPENDECTOMY    . KIDNEY STONE SURGERY    . LUMBAR SPINE SURGERY  2007   L2-3 and L3-4 decompressive laminotomies and foraminotomies  . RADIOFREQUENCY ABLATION  2006   atrial tachycardia Dr.Gregg Ladona Ridgel  . TOTAL ABDOMINAL HYSTERECTOMY      Family History  Problem Relation Age of Onset  . Coronary artery disease Father   . Stroke Father   . Coronary artery disease Mother        also a sibling  . Heart disease Mother   . Heart disease Sister   . Heart disease Sister   . Heart disease Sister   . Heart disease Sister   . Heart disease Sister   . Heart disease Sister   . Heart disease Brother   . Heart disease Brother   . Heart disease Brother   . Heart disease Brother   . Heart disease Brother   . Healthy Daughter     Social History:  reports that she quit smoking about 10 years ago. Her smoking use included cigarettes. She has a 50.00 pack-year smoking history. She has never used smokeless tobacco. She reports that she does not drink alcohol or use drugs.  Allergies:  Allergies  Allergen Reactions  . Ace Inhibitors Other (See Comments)    Hyperkalemia-mild   . Codeine Nausea And Vomiting  . Penicillins Swelling  . Ropinirole Hcl Other (See Comments)    Pt wasn't in right state of mind    Medications: Prior to Admission medications   Medication Sig Start Date End Date Taking? Authorizing Provider  acetaminophen (TYLENOL) 650 MG CR tablet Take 650 mg by mouth 2 (two) times daily as needed for pain.   Yes [provider]  carbidopa-levodopa (SINEMET IR) 10-100 MG tablet Take 1 Table tby mouth 3 Times Daily As Directed for Parkinson's Disease 05/21/16  Yes [provider]  clopidogrel (PLAVIX) 75 MG tablet Take 75 mg by mouth at bedtime.    Yes [provider]  furosemide (LASIX) 40 MG tablet 40 mg. Take 1/2tablet by mouth twice daily 06/16/14  Yes [provider]  glipiZIDE (GLUCOTROL XL) 10 MG 24 hr tablet TAKE  (1)  TABLET BY MOUTH TWICE  A DAY. 04/25/15  Yes [provider]  levothyroxine (SYNTHROID) 100 MCG tablet Take 1 tablet by mouth daily. 05/01/18  Yes [provider]  linagliptin (TRADJENTA) 5 MG TABS tablet Take 5 mg by mouth daily.  06/07/16  Yes [provider]  lisinopril (ZESTRIL) 20 MG tablet Take 1 tablet by mouth daily. 04/15/18  Yes [provider]  pramipexole (MIRAPEX) 0.75 MG tablet Take 0.75 mg by mouth 3 (three) times daily.   Yes [provider]  pravastatin (PRAVACHOL) 40 MG tablet Take 1 tablet by mouth daily. 10/17/14  Yes [provider]  pregabalin (LYRICA) 100 MG capsule Take 100 mg by mouth 3 (three) times daily.  05/21/16  Yes  [provider]  selegiline (ELDEPRYL) 5 MG capsule Take 5 mg by mouth 2 (two) times daily.  05/20/17  Yes [provider]  Vitamin D, Ergocalciferol, (DRISDOL) 50000 UNITS CAPS capsule Take 1 capsule by mouth once a week. 10/13/14  Yes [provider]  tiZANidine (ZANAFLEX) 4 MG tablet 1/2 or 1 Tablet by mout Up To 4 Times Per Day for Muscle Spasms. May Cause Drowsiness 05/21/16   [provider]    Scheduled Meds: . aspirin  300 mg Rectal Once  . insulin aspart  0-9 Units Subcutaneous Q4H   Continuous Infusions: . sodium chloride    . [START ON 06/20/2018] cefTRIAXone (ROCEPHIN)  IV    . famotidine (PEPCID) IV     PRN Meds:.acetaminophen, fentaNYL (SUBLIMAZE) injection, fentaNYL (SUBLIMAZE) injection, ondansetron (ZOFRAN) IV     Results for orders placed or performed during the hospital encounter of 06-26-18 (from the past 48 hour(s))  CBG monitoring, ED     Status: None   Collection Time: 06-26-18 11:51 AM  Result Value Ref Range   Glucose-Capillary 79 70 - 99 mg/dL  Urine rapid drug screen (hosp performed)     Status: None   Collection Time: 06-26-18 12:13 PM  Result Value Ref Range   Opiates NONE DETECTED NONE DETECTED   Cocaine NONE DETECTED NONE DETECTED   Benzodiazepines NONE  DETECTED NONE DETECTED   Amphetamines NONE DETECTED NONE DETECTED   Tetrahydrocannabinol NONE DETECTED NONE DETECTED   Barbiturates NONE DETECTED NONE DETECTED    Comment: (NOTE) DRUG SCREEN FOR MEDICAL PURPOSES ONLY.  IF CONFIRMATION IS NEEDED FOR ANY PURPOSE, NOTIFY LAB WITHIN 5 DAYS. LOWEST DETECTABLE LIMITS FOR URINE DRUG SCREEN Drug Class                     Cutoff (ng/mL) Amphetamine and metabolites    1000 Barbiturate and metabolites    200 Benzodiazepine                 200 Tricyclics and metabolites     300 Opiates and metabolites        300 Cocaine and metabolites        300 THC                            50 Performed at [facility], [address] [place], [place] [id number]   Urinalysis, Routine w reflex microscopic     Status: Abnormal   Collection Time: 06-26-18 12:13 PM  Result Value Ref Range   Color, Urine AMBER (A) YELLOW    Comment: BIOCHEMICALS MAY BE AFFECTED BY COLOR   APPearance CLOUDY (A) CLEAR   Specific Gravity, Urine 1.023 1.005 - 1.030   pH 5.0 5.0 - 8.0   Glucose, UA NEGATIVE NEGATIVE mg/dL   Hgb urine dipstick MODERATE (A) NEGATIVE   Bilirubin Urine NEGATIVE NEGATIVE   Ketones, ur 20 (A) NEGATIVE mg/dL   Protein, ur 30 (A) NEGATIVE mg/dL   Nitrite POSITIVE (A) NEGATIVE   Leukocytes,Ua MODERATE (A) NEGATIVE   RBC / HPF 6-10 0 - 5 RBC/hpf   WBC, UA 21-50 0 - 5 WBC/hpf   Bacteria, UA MANY (A) NONE SEEN   Squamous Epithelial / LPF 6-10 0 - 5   WBC Clumps PRESENT    Mucus PRESENT    Budding Yeast PRESENT    Hyaline Casts, UA PRESENT     Comment: Performed at [facility], [address], [place], [place] [address]  SARS Coronavirus 2 (CEPHEID - Performed in [facility] hospital lab), Hosp Order     Status: None   Collection Time: Jun 21, 2018 12:15 PM  Result Value Ref Range   SARS Coronavirus 2 NEGATIVE NEGATIVE    Comment: (NOTE) If result is NEGATIVE SARS-CoV-2 target nucleic acids are NOT DETECTED. The SARS-CoV-2 RNA is generally detectable  in upper and lower  respiratory specimens during the acute phase of infection. The lowest  concentration of SARS-CoV-2 viral copies this assay can detect is 250  copies / mL. A negative result does not preclude SARS-CoV-2 infection  and should not be used as the sole basis for treatment or other  patient management decisions.  A negative result may occur with  improper specimen collection / handling, submission of specimen other  than nasopharyngeal swab, presence of viral mutation(s) within the  areas targeted by this assay, and inadequate number of viral copies  (<250 copies / mL). A negative result must be combined with clinical  observations, patient history, and epidemiological information. If result is POSITIVE SARS-CoV-2 target nucleic acids are DETECTED. The SARS-CoV-2 RNA is generally detectable in upper and lower  respiratory specimens dur ing the acute phase of infection.  Positive  results are indicative of active infection with SARS-CoV-2.  Clinical  correlation with patient history and other diagnostic information is  necessary to determine patient infection status.  Positive results do  not rule out bacterial infection or co-infection with other viruses. If result is PRESUMPTIVE POSTIVE SARS-CoV-2 nucleic acids MAY BE PRESENT.   A presumptive positive result was obtained on the submitted specimen  and confirmed on repeat testing.  While 2019 novel coronavirus  (SARS-CoV-2) nucleic acids may be present in the submitted sample  additional confirmatory testing may be necessary for epidemiological  and / or clinical management purposes  to differentiate between  SARS-CoV-2 and other Sarbecovirus currently known to infect humans.  If clinically indicated additional testing with an alternate test  methodology [phone]) is advised. The SARS-CoV-2 RNA is generally  detectable in upper and lower respiratory sp ecimens during the acute  phase of infection. The expected result is  Negative. Fact Sheet for Patients:  BoilerBrush.com.cy Fact Sheet for Healthcare Providers: https://pope.com/ This test is not yet approved or cleared by the [place] FDA and has been authorized for detection and/or diagnosis of SARS-CoV-2 by FDA under an Emergency Use Authorization (EUA).  This EUA will remain in effect (meaning this test can be used) for the duration of the COVID-19 declaration under Section 564(b)(1) of the Act, 21 U.S.C. section 360bbb-3(b)(1), unless the authorization is terminated or revoked sooner. Performed at [facility], [address] [place], [place] [address]   Ethanol     Status: None   Collection Time: Jun 21, 2018  1:06 PM  Result Value Ref Range   Alcohol, Ethyl (B) <10 <10 mg/dL    Comment: (NOTE) Lowest detectable limit for serum alcohol is 10 mg/dL. For medical purposes only. Performed at [facility], [address] [place], [place] [address]   Protime-INR     Status: None   Collection Time: Jun 21, 2018  1:06 PM  Result Value Ref Range   Prothrombin Time 13.5 11.4 - 15.2 seconds   INR 1.0 0.8 - 1.2    Comment: (NOTE) INR goal varies based on device and disease states. Performed at [facility], [address] [place], [place] [address]   APTT     Status: None   Collection  Time: 07-07-2018  1:06 PM  Result Value Ref Range   aPTT 26 24 - 36 seconds    Comment: Performed at [facility], [address] [place], [place] [address]  CBC     Status: None   Collection Time: 07/07/2018  1:06 PM  Result Value Ref Range   WBC 7.6 4.0 - 10.5 K/uL   RBC 4.87 3.87 - 5.11 MIL/uL   Hemoglobin 14.3 12.0 - 15.0 g/dL   HCT 40.9 81.1 - 91.4 %   MCV 93.8 80.0 - 100.0 fL   MCH 29.4 26.0 - 34.0 pg   MCHC 31.3 30.0 - 36.0 g/dL   RDW 78.2 95.6 - 21.3 %   Platelets 195 150 - 400 K/uL   nRBC 0.0 0.0 - 0.2 %    Comment: Performed at [facility], [address] [place], [place] [address]  Differential     Status:  None   Collection Time: 07-Jul-2018  1:06 PM  Result Value Ref Range   Neutrophils Relative % 84 %   Neutro Abs 6.3 1.7 - 7.7 K/uL   Lymphocytes Relative 9 %   Lymphs Abs 0.7 0.7 - 4.0 K/uL   Monocytes Relative 7 %   Monocytes Absolute 0.5 0.1 - 1.0 K/uL   Eosinophils Relative 0 %   Eosinophils Absolute 0.0 0.0 - 0.5 K/uL   Basophils Relative 0 %   Basophils Absolute 0.0 0.0 - 0.1 K/uL   Immature Granulocytes 0 %   Abs Immature Granulocytes 0.03 0.00 - 0.07 K/uL    Comment: Performed at [facility], [address], [place], [place] [address]  Comprehensive metabolic panel     Status: Abnormal   Collection Time: 07/07/18  1:06 PM  Result Value Ref Range   Sodium 140 135 - 145 mmol/L   Potassium 4.0 3.5 - 5.1 mmol/L   Chloride 106 98 - 111 mmol/L   CO2 18 (L) 22 - 32 mmol/L   Glucose, Bld 109 (H) 70 - 99 mg/dL   BUN 30 (H) 8 - 23 mg/dL   Creatinine, Ser 2.95 (H) 0.44 - 1.00 mg/dL   Calcium 9.6 8.9 - 28.4 mg/dL   Total Protein 7.1 6.5 - 8.1 g/dL   Albumin 3.8 3.5 - 5.0 g/dL   AST 43 (H) 15 - 41 U/L   ALT 22 0 - 44 U/L   Alkaline Phosphatase 83 38 - 126 U/L   Total Bilirubin 0.9 0.3 - 1.2 mg/dL   GFR calc non Af Amer 43 (L) >60 mL/min   GFR calc Af Amer 49 (L) >60 mL/min   Anion gap 16 (H) 5 - 15    Comment: Performed at [facility], [address] [place], [place] [address]  CK     Status: Abnormal   Collection Time: 07-07-2018  1:06 PM  Result Value Ref Range   Total CK 1,065 (H) 38 - 234 U/L    Comment: Performed at [facility], [address], [place], [place] [address]  Blood gas, arterial     Status: Abnormal   Collection Time: 07-07-18  1:15 PM  Result Value Ref Range   FIO2 100.00    pH, Arterial 7.415 7.350 - 7.450   pCO2 arterial 30.0 (L) 32.0 - 48.0 mmHg   pO2, Arterial 407 (H) 83.0 - 108.0 mmHg   Bicarbonate 21.0 20.0 - 28.0 mmol/L   Acid-base deficit 4.9 (H) 0.0 - 2.0 mmol/L   O2 Saturation 99.8 %   Patient temperature 36.4  Allens test (pass/fail) PASS  PASS    Comment: Performed at [facility], [address], [place], [place] [address]  TSH     Status: None   Collection Time: 06/29/2018  1:15 PM  Result Value Ref Range   TSH 0.462 0.350 - 4.500 uIU/mL    Comment: Performed by a 3rd Generation assay with a functional sensitivity of <=0.01 uIU/mL. Performed at [facility], [address], [place], [place] [address]   Troponin I - Add-On to previous collection     Status: Abnormal   Collection Time: 06/15/2018  3:51 PM  Result Value Ref Range   Troponin I 0.05 (HH) <0.03 ng/mL    Comment: CRITICAL RESULT CALLED TO, READ BACK BY AND VERIFIED WITH: [email or web address].E ON 06/16/2018 AT 1650 BY LOY,C Performed at [facility], [address] [place], [place] [address]   Magnesium     Status: None   Collection Time: 06/18/2018  5:19 PM  Result Value Ref Range   Magnesium 2.3 1.7 - 2.4 mg/dL    Comment: Performed at [facility], [address] [place], [place] [address]  Vitamin B12     Status: Abnormal   Collection Time: 07/12/2018  5:45 PM  Result Value Ref Range   Vitamin B-12 101 (L) 180 - 914 pg/mL    Comment: (NOTE) This assay is not validated for testing neonatal or myeloproliferative syndrome specimens for Vitamin B12 levels. Performed at [facility], [address], [place], [place] [address]   Lactic acid, plasma     Status: None   Collection Time: 06/16/2018  5:45 PM  Result Value Ref Range   Lactic Acid, Venous 1.7 0.5 - 1.9 mmol/L    Comment: Performed at [facility], [address] [place], [place] [address]    Studies/Results:  HEAD CT FINDINGS: Brain: No acute intracranial hemorrhage. No midline shift or mass effect. Gray-white differentiation maintained. Progression of senescent volume loss. Patchy hypodensities in the periventricular white matter. Unremarkable appearance of the ventricular system.  Vascular: Dense calcifications of anterior and posterior circulation.  Skull: No acute fracture.  No aggressive bone lesion  identified.  Sinuses/Orbits: Unremarkable appearance of the orbits. Mastoid air cells clear. No middle ear effusion. No significant sinus disease.  Other: Endotracheal tube in position.  Fluid in the nasopharynx  IMPRESSION: Negative for acute intracranial abnormality.  Senescent volume loss and evidence of chronic microvascular ischemic Disease.     The head CT scan is reviewed in person and shows appropriate age-related atrophy and a white matter ischemic changes that is also age-appropriate.  No acute findings are noted.   Louie Meaders A. Gerilyn Pilgrim, M.D.  Diplomate, Biomedical engineer of Psychiatry and Neurology ( Neurology). 06/22/2018, 7:13 PM

## 2018-06-19 NOTE — Progress Notes (Signed)
Pt transported to and from CT on vent.  RT will continue to monitor.  

## 2018-06-19 NOTE — Progress Notes (Signed)
Patient transported to ICU from ER, no complications noted. RT will continue to monitor.

## 2018-06-19 NOTE — ED Notes (Signed)
CRITICAL VALUE ALERT  Critical Value:  Troponin 0.05  Date & Time Notied:  07-Jul-2018 1652  Provider Notified: Dr. Mariea Clonts   Orders Received/Actions taken: None yet

## 2018-06-20 ENCOUNTER — Inpatient Hospital Stay (HOSPITAL_COMMUNITY): Payer: Medicare Other

## 2018-06-20 ENCOUNTER — Inpatient Hospital Stay (HOSPITAL_COMMUNITY)
Admit: 2018-06-20 | Discharge: 2018-06-20 | Disposition: A | Discharge status: Home / Self Care | Payer: Medicare Other | Attending: Neurology | Admitting: Neurology

## 2018-06-20 DIAGNOSIS — N390 Urinary tract infection, site not specified: Secondary | ICD-10-CM

## 2018-06-20 DIAGNOSIS — G9340 Encephalopathy, unspecified: Secondary | ICD-10-CM

## 2018-06-20 DIAGNOSIS — J9601 Acute respiratory failure with hypoxia: Secondary | ICD-10-CM

## 2018-06-20 DIAGNOSIS — M6282 Rhabdomyolysis: Secondary | ICD-10-CM

## 2018-06-20 LAB — BLOOD GAS, ARTERIAL
Acid-base deficit: 3.1 mmol/L — ABNORMAL HIGH (ref 0.0–2.0)
Bicarbonate: 22.1 mmol/L (ref 20.0–28.0)
FIO2: 40
O2 Saturation: 98.6 %
Patient temperature: 37
pCO2 arterial: 34 mmHg (ref 32.0–48.0)
pH, Arterial: 7.405 (ref 7.350–7.450)
pO2, Arterial: 146 mmHg — ABNORMAL HIGH (ref 83.0–108.0)

## 2018-06-20 LAB — GLUCOSE, CAPILLARY
Glucose-Capillary: 103 mg/dL — ABNORMAL HIGH (ref 70–99)
Glucose-Capillary: 108 mg/dL — ABNORMAL HIGH (ref 70–99)
Glucose-Capillary: 117 mg/dL — ABNORMAL HIGH (ref 70–99)
Glucose-Capillary: 127 mg/dL — ABNORMAL HIGH (ref 70–99)
Glucose-Capillary: 132 mg/dL — ABNORMAL HIGH (ref 70–99)
Glucose-Capillary: 94 mg/dL (ref 70–99)

## 2018-06-20 LAB — BASIC METABOLIC PANEL
Anion gap: 15 (ref 5–15)
BUN: 30 mg/dL — ABNORMAL HIGH (ref 8–23)
CO2: 16 mmol/L — ABNORMAL LOW (ref 22–32)
Calcium: 8.7 mg/dL — ABNORMAL LOW (ref 8.9–10.3)
Chloride: 113 mmol/L — ABNORMAL HIGH (ref 98–111)
Creatinine, Ser: 1.21 mg/dL — ABNORMAL HIGH (ref 0.44–1.00)
GFR calc Af Amer: 47 mL/min — ABNORMAL LOW (ref >60)
GFR calc non Af Amer: 40 mL/min — ABNORMAL LOW (ref >60)
Glucose, Bld: 140 mg/dL — ABNORMAL HIGH (ref 70–99)
Potassium: 4.1 mmol/L (ref 3.5–5.1)
Sodium: 144 mmol/L (ref 135–145)

## 2018-06-20 LAB — TROPONIN I: Troponin I: 0.06 ng/mL (ref <0.03)

## 2018-06-20 LAB — CBC
HCT: 34 % — ABNORMAL LOW (ref 36.0–46.0)
Hemoglobin: 10.8 g/dL — ABNORMAL LOW (ref 12.0–15.0)
MCH: 30 pg (ref 26.0–34.0)
MCHC: 31.8 g/dL (ref 30.0–36.0)
MCV: 94.4 fL (ref 80.0–100.0)
Platelets: 177 10*3/uL (ref 150–400)
RBC: 3.6 MIL/uL — ABNORMAL LOW (ref 3.87–5.11)
RDW: 14.2 % (ref 11.5–15.5)
WBC: 9.7 10*3/uL (ref 4.0–10.5)
nRBC: 0 % (ref 0.0–0.2)

## 2018-06-20 LAB — LIPID PANEL
Cholesterol: 129 mg/dL (ref 0–200)
HDL: 34 mg/dL — ABNORMAL LOW (ref >40)
LDL Cholesterol: 61 mg/dL (ref 0–99)
Total CHOL/HDL Ratio: 3.8 RATIO
Triglycerides: 172 mg/dL — ABNORMAL HIGH (ref <150)
VLDL: 34 mg/dL (ref 0–40)

## 2018-06-20 LAB — FOLATE RBC
Folate, Hemolysate: 310 ng/mL
Folate, RBC: 783 ng/mL (ref >498)
Hematocrit: 39.6 % (ref 34.0–46.6)

## 2018-06-20 LAB — HEMOGLOBIN A1C
Hgb A1c MFr Bld: 6.2 % — ABNORMAL HIGH (ref 4.8–5.6)
Hgb A1c MFr Bld: 6.3 % — ABNORMAL HIGH (ref 4.8–5.6)
Mean Plasma Glucose: 131.24 mg/dL
Mean Plasma Glucose: 134.11 mg/dL

## 2018-06-20 LAB — T4: T4, Total: 9.7 ug/dL (ref 4.5–12.0)

## 2018-06-20 LAB — CK: Total CK: 721 U/L — ABNORMAL HIGH (ref 38–234)

## 2018-06-20 LAB — AMMONIA: Ammonia: 12 umol/L (ref 9–35)

## 2018-06-20 LAB — FOLATE: Folate: 7.3 ng/mL (ref >5.9)

## 2018-06-20 MED ORDER — LEVOTHYROXINE SODIUM 100 MCG/5ML IV SOLN
50.0000 ug | Freq: Every day | INTRAVENOUS | Status: DC
  Administered 2018-06-20 – 2018-06-27 (×8): 50 ug via INTRAVENOUS
  Filled 2018-06-20 (×8): qty 5

## 2018-06-20 MED ORDER — ASPIRIN 300 MG RE SUPP
300.0000 mg | Freq: Every day | RECTAL | Status: DC
  Administered 2018-06-20 – 2018-06-27 (×8): 300 mg via RECTAL
  Filled 2018-06-20 (×8): qty 1

## 2018-06-20 MED ORDER — ALBUTEROL SULFATE (2.5 MG/3ML) 0.083% IN NEBU: 2.5000 mg | INHALATION_SOLUTION | Freq: Four times a day (QID) | RESPIRATORY_TRACT | Status: DC | PRN

## 2018-06-20 MED ORDER — CYANOCOBALAMIN 1000 MCG/ML IJ SOLN
1000.0000 ug | Freq: Every day | INTRAMUSCULAR | Status: AC
  Administered 2018-06-20 – 2018-06-25 (×6): 1000 ug via INTRAMUSCULAR
  Filled 2018-06-20 (×6): qty 1

## 2018-06-20 NOTE — Progress Notes (Signed)
Initial Nutrition Assessment  RD working remotely.   DOCUMENTATION CODES:     INTERVENTION:  RD will follow up-patient status and plan of care on Monday    NUTRITION DIAGNOSIS:   Inadequate oral intake related to inability to eat as evidenced by estimated needs, NPO status.   GOAL:   Provide needs based on ASPEN/SCCM guidelines  MONITOR:   Vent status  REASON FOR ASSESSMENT:   Ventilator    ASSESSMENT: Patient has been intubated for airway protection after being found at home by her granddaughter in an unresponsive state. Acute encephalopathy. Patient history includes: HTN, DM-2, Parkinson's, TIA, GERD and UTI's.   Multiple attempts to place NG/OG unsuccessful and radiologist not available to assist until Monday. Discussed pt with RN. -EEG results pending. No opportunity for tube feeds to be initiated over the weekend given patient current status. RD will follow up on Monday and provide additional recommendations as indicated and clinical course becomes more clear.  MV: 8.6  L/min Temp (24hrs), Avg:99.4 F (37.4 C), Min:98.2 F (36.8 C), Max:100.4 F (38 C) Sedative-fentynl  Patient weighed 77 kg last year in early April. Unclear at this time whether her weight loss has been gradual- but she is down 22% compared to 13 months ago which is clinically relevant.    Medications reviewed and include: vitamin B-12, SSI, Sinemet, levothyroxine, Pepcid, Keppra, Rocephin  Labs: BMP Latest Ref Rng & Units 06/20/2018 06/21/2018 11/21/2012  Glucose 70 - 99 mg/dL 606(T) 016(W) 109(N)  BUN 8 - 23 mg/dL 23(F) 57(D) 19  Creatinine 0.44 - 1.00 mg/dL 2.20(U) 5.42(H) 0.62(B)  Sodium 135 - 145 mmol/L 144 140 140  Potassium 3.5 - 5.1 mmol/L 4.1 4.0 4.4  Chloride 98 - 111 mmol/L 113(H) 106 107  CO2 22 - 32 mmol/L 16(L) 18(L) 24  Calcium 8.9 - 10.3 mg/dL 7.6(E) 9.6 9.3     NUTRITION - FOCUSED PHYSICAL EXAM:  Unable to complete Nutrition-Focused physical exam at this time.    Diet  Order:   Diet Order    None      EDUCATION NEEDS:   Not appropriate for education at this time   Skin:  Skin Assessment: Reviewed RN Assessment  Last BM:  unknown  Height:   Ht Readings from Last 1 Encounters:  06/18/2018 5\' 3"  (1.6 m)    Weight:   Wt Readings from Last 1 Encounters:  06/20/18 60.4 kg    Ideal Body Weight:  52 kg  BMI:  Body mass index is 23.59 kg/m.  Estimated Nutritional Needs:   Kcal:  1446   Protein:  68-78 (1.3-1.5 gr/kg/bw)  Fluid:  per MD  Royann Shivers MS,RD,CSG,LDN Office: [phone] Pager: [phone]

## 2018-06-20 NOTE — Progress Notes (Signed)
PROGRESS NOTE  Jennifer [name] OMA:[id number] DOB: [birth year] DOA: 06/18/2018 PCP: [name], MD  Brief History:  85 year old female with a history of TIA, Parkinson's disease, hypothyroidism, hypertension, diabetes mellitus, hyperlipidemia, PSVT status post RFA 2006, and tobacco abuse in remission presented with unresponsiveness.  The patient lives by herself, but her granddaughter intermittently checks up on her.  On 06/18/2018, the patient granddaughter noted the patient to be lethargic, but felt that the patient was just tired and sleepy.  She went back to check up on the patient on the morning of 07/01/2018 and noted the patient to be essentially unresponsive.  EMS was activated.  In the emergency department, the patient was intubated to protect her airway secondary to her encephalopathy.  According to the patient's daughter, the patient has had a long history of poor compliance with her medications.  It was felt in the past that her encephalopathy may have been partly attributed to not taking her Parkinson's medicines as well as he thyroid replacement.  However, the patient has never been unresponsive.  There is been no history of recent fevers, chills, complaints of chest pain, shortness breath, vomiting, diarrhea.  At baseline, the patient has some pleasant confusion but is able to converse.  She normally gets around in a wheelchair, but occasionally uses a walker. In the emergency department, the patient had temperature 100.4 F, but blood pressure as high as 208/73.  EKG showed sinus tachycardia without any ST-T wave changes.  CPK was 1065.  UA showed 21-50 WBC.  Otherwise BMP and CBC were essentially unremarkable.  CT of the brain was negative for any acute findings.  Chest x-ray was negative for any infiltrates.  Neurology was consulted to assist with management.  Assessment/Plan: Acute encephalopathy, type unspecified -Etiology unclear although likely multifactorial including UTI,  B12 deficiency, possible seizure -Appreciate neurology consult -06/15/2018 CT brain negative for acute findings -MRI of brain if possible -Folic acid -Ammonia -TSH 0.462 -Urine drug screen negative -Personally reviewed EKG--sinus rhythm, no ST-T wave changes -06/20/2018--patient remains unresponsive to protopathic stimuli despite being off sedation x12 hours -Obtain EEG -continue empiric keppra  Pyuria -Concerning for UTI -Continue ceftriaxone pending culture data  B12 deficiency -Replete  Acute respiratory failure with hypoxia -Secondary to hypoventilation from encephalopathy -Currently on mechanical ventilation -personally reviewed CXR--no edema or infiltrates  Nontraumatic rhabdomyolysis -Initial CPK 1065 -Judicious IV fluids  Elevated troponin -Secondary to demand ischemia -Trend is flat -Echocardiogram  Diabetes mellitus type 2 -Holding glipizide and Tradjenta -NovoLog sliding scale -Hemoglobin A1c  Parkinson's disease -Restart Sinemet/pramipexole/selegiline if able to obtain NG/OG access -Multiple repeated attempts on 07/04/2018 at bedside without success  Hypothyroidism -Start IV Synthroid  Hyperlipidemia -Restart statin once able to tolerate   The patient is critically ill with multiple organ systems failure and requires high complexity decision making for assessment and support, frequent evaluation and titration of therapies, application of advanced monitoring technologies and extensive interpretation of multiple databases.  Critical care time - 45 mins.     Disposition Plan:  Remain in ICU Family Communication:   Daughter updated on phone  Consultants:  pulmonary  Code Status:  FULL  DVT Prophylaxis:  [place] Lovenox   Procedures: As Listed in Progress Note Above  Antibiotics: Ceftriaxone 5/7>>     Subjective: Pt remains unresponsive to protopathic stimuli.  No reports of vomiting, diarrhea, respiratory distress, uncontrolled pain   Objective: Vitals:   06/20/18 0519 06/20/18 0530 06/20/18 0600 06/20/18  0700  BP:  (!) 108/43 (!) 141/46 (!) 146/46  Pulse:  89 98 (!) 105  Resp:  (!) _0 Temp:  100.2 F (37.9 C) 99.7 F (37.6 C) 99.7 F (37.6 C)  SpO2: 100% 100% 100% 100%  Weight:      Height:        Intake/Output Summary (Last 24 hours) at 06/20/2018 0758 Last data filed at 06/20/2018 0400 Gross per 24 hour  Intake 629.02 ml  Output 150 ml  Net 479.02 ml   Weight change:  Exam:   General:  Pt is sedated on vent; postures to protopathic stimuli  HEENT: No icterus, No thrush, No neck mass, /AT  Cardiovascular: RRR, S1/S2, no rubs, no gallops  Respiratory: bibasilar rales  Abdomen: Soft/+BS, non tender, non distended, no guarding  Extremities: No edema, No lymphangitis, No petechiae, No rashes, no synovitis  Neuro--roving eye movements; +corneal reflex.  Postures to protopathic stimuli   Data Reviewed: I have personally reviewed following labs and imaging studies Basic Metabolic Panel: Recent Labs  Lab 06/22/2018 1306 07/08/2018 1719 06/20/18 0344  NA 140  --  144  K 4.0  --  4.1  CL 106  --  113*  CO2 18*  --  16*  GLUCOSE 109*  --  140*  BUN 30*  --  30*  CREATININE 1.16*  --  1.21*  CALCIUM 9.6  --  8.7*  MG  --  2.3  --    Liver Function Tests: Recent Labs  Lab 06/25/2018 1306  AST 43*  ALT 22  ALKPHOS 83  BILITOT 0.9  PROT 7.1  ALBUMIN 3.8   No results for input(s): LIPASE, AMYLASE in the last 168 hours. No results for input(s): AMMONIA in the last 168 hours. Coagulation Profile: Recent Labs  Lab 06/20/2018 1306  INR 1.0   CBC: Recent Labs  Lab 07/07/2018 1306 06/20/18 0344  WBC 7.6 9.7  NEUTROABS 6.3  --   HGB 14.3 10.8*  HCT 45.7 34.0*  MCV 93.8 94.4  PLT 195 177   Cardiac Enzymes: Recent Labs  Lab 06/20/2018 1306 06/23/2018 1551 07/08/2018 2104 06/20/18 0344  CKTOTAL 1,065*  --   --  721*  TROPONINI  --  0.05* 0.06* 0.06*   BNP: Invalid input(s): POCBNP  CBG: Recent Labs  Lab 06/17/2018 1151 06/20/2018 2003 07/05/2018 2337 06/20/18 0334 06/20/18 0746  GLUCAP 79 144* 103* 132* 103*   HbA1C: No results for input(s): HGBA1C in the last 72 hours. Urine analysis:    Component Value Date/Time   COLORURINE AMBER (A) 06/18/2018 1213   APPEARANCEUR CLOUDY (A) 07/06/2018 1213   LABSPEC 1.023 06/22/2018 1213   PHURINE 5.0 07/12/2018 1213   GLUCOSEU NEGATIVE 07/09/2018 1213   HGBUR MODERATE (A) 07/07/2018 1213   BILIRUBINUR NEGATIVE 06/14/2018 1213   KETONESUR 20 (A) 06/28/2018 1213   PROTEINUR 30 (A) 07/11/2018 1213   UROBILINOGEN 0.2 11/20/2012 1254   NITRITE POSITIVE (A) 06/21/2018 1213   LEUKOCYTESUR MODERATE (A) 06/29/2018 1213   Sepsis Labs: _1 (procalcitonin:4,lacticidven:4) ) Recent Results (from the past 240 hour(s))  SARS Coronavirus 2 (CEPHEID - Performed in [place] hospital lab), Hosp Order     Status: None   Collection Time: 07/12/2018 12:15 PM  Result Value Ref Range Status   SARS Coronavirus 2 NEGATIVE NEGATIVE Final    Comment: (NOTE) If result is NEGATIVE SARS-CoV-2 target nucleic acids are NOT DETECTED. The SARS-CoV-2 RNA is generally detectable in upper and lower  respiratory specimens  during the acute phase of infection. The lowest  concentration of SARS-CoV-2 viral copies this assay can detect is 250  copies / mL. A negative result does not preclude SARS-CoV-2 infection  and should not be used as the sole basis for treatment or other  patient management decisions.  A negative result may occur with  improper specimen collection / handling, submission of specimen other  than nasopharyngeal swab, presence of viral mutation(s) within the  areas targeted by this assay, and inadequate number of viral copies  (<250 copies / mL). A negative result must be combined with clinical  observations, patient history, and epidemiological information. If result is POSITIVE SARS-CoV-2 target nucleic acids are DETECTED.  The SARS-CoV-2 RNA is generally detectable in upper and lower  respiratory specimens dur ing the acute phase of infection.  Positive  results are indicative of active infection with SARS-CoV-2.  Clinical  correlation with patient history and other diagnostic information is  necessary to determine patient infection status.  Positive results do  not rule out bacterial infection or co-infection with other viruses. If result is PRESUMPTIVE POSTIVE SARS-CoV-2 nucleic acids MAY BE PRESENT.   A presumptive positive result was obtained on the submitted specimen  and confirmed on repeat testing.  While 2019 novel coronavirus  (SARS-CoV-2) nucleic acids may be present in the submitted sample  additional confirmatory testing may be necessary for epidemiological  and / or clinical management purposes  to differentiate between  SARS-CoV-2 and other Sarbecovirus currently known to infect humans.  If clinically indicated additional testing with an alternate test  methodology [phone]) is advised. The SARS-CoV-2 RNA is generally  detectable in upper and lower respiratory sp ecimens during the acute  phase of infection. The expected result is Negative. Fact Sheet for Patients:  StrictlyIdeas.no Fact Sheet for Healthcare Providers: BankingDealers.co.za This test is not yet approved or cleared by the [place] FDA and has been authorized for detection and/or diagnosis of SARS-CoV-2 by FDA under an Emergency Use Authorization (EUA).  This EUA will remain in effect (meaning this test can be used) for the duration of the COVID-19 declaration under Section 564(b)(1) of the Act, 21 U.S.C. section 360bbb-3(b)(1), unless the authorization is terminated or revoked sooner. Performed at [facility], [address], [place], [place] [address]   MRSA PCR Screening     Status: None   Collection Time: 06/14/2018  7:13 PM  Result Value Ref Range Status   MRSA by PCR  NEGATIVE NEGATIVE Final    Comment:        The GeneXpert MRSA Assay (FDA approved for NASAL specimens only), is one component of a comprehensive MRSA colonization surveillance program. It is not intended to diagnose MRSA infection nor to guide or monitor treatment for MRSA infections. Performed at [facility], [address] [place], [place] [address]   Culture, blood (routine x 2)     Status: None (Preliminary result)   Collection Time: 06/29/2018  9:04 PM  Result Value Ref Range Status   Specimen Description BLOOD LEFT HAND  Final   Special Requests   Final    BOTTLES DRAWN AEROBIC AND ANAEROBIC Blood Culture adequate volume   Culture   Final    NO GROWTH < 12 HOURS Performed at [facility], [address], [place],  [address]    Report Status PENDING  Incomplete  Culture, blood (routine x 2)     Status: None (Preliminary result)   Collection Time: 06/15/2018  9:05 PM  Result  Value Ref Range Status   Specimen Description BLOOD RIGHT HAND  Final   Special Requests   Final    BOTTLES DRAWN AEROBIC AND ANAEROBIC Blood Culture adequate volume   Culture   Final    NO GROWTH < 12 HOURS Performed at [facility], [address] [place], [place] [address]    Report Status PENDING  Incomplete     Scheduled Meds: . aspirin  325 mg Oral Daily  . carbidopa-levodopa  1 tablet Oral TID  . chlorhexidine gluconate (MEDLINE KIT)  15 mL Mouth Rinse BID  . Chlorhexidine Gluconate Cloth  6 each Topical Q0600  . insulin aspart  0-9 Units Subcutaneous Q4H  . mouth rinse  15 mL Mouth Rinse 10 times per day   Continuous Infusions: . sodium chloride 100 mL/hr at 06/20/18 0300  . cefTRIAXone (ROCEPHIN)  IV    . famotidine (PEPCID) IV Stopped (06/22/2018 2125)  . levETIRAcetam 500 mg (06/20/18 0756)    Procedures/Studies: Ct Head Wo Contrast  Result Date: 07/01/2018 CLINICAL DATA:  85 year old female with a history of altered mental status EXAM: CT HEAD WITHOUT CONTRAST TECHNIQUE:  Contiguous axial images were obtained from the base of the skull through the vertex without intravenous contrast. COMPARISON:  12/15/2012 FINDINGS: Brain: No acute intracranial hemorrhage. No midline shift or mass effect. Gray-white differentiation maintained. Progression of senescent volume loss. Patchy hypodensities in the periventricular white matter. Unremarkable appearance of the ventricular system. Vascular: Dense calcifications of anterior and posterior circulation. Skull: No acute fracture.  No aggressive bone lesion identified. Sinuses/Orbits: Unremarkable appearance of the orbits. Mastoid air cells clear. No middle ear effusion. No significant sinus disease. Other: Endotracheal tube in position.  Fluid in the nasopharynx IMPRESSION: Negative for acute intracranial abnormality. Senescent volume loss and evidence of chronic microvascular ischemic disease. Electronically Signed   By: Corrie Mckusick D.O.   On: 06/21/2018 14:11   Dg Chest Portable 1 View  Result Date: 06/21/2018 CLINICAL DATA:  Unresponsive. EXAM: PORTABLE CHEST 1 VIEW COMPARISON:  06/27/2010 FINDINGS: 1301 hours. Endotracheal tube tip is approximately 1.4 cm above the base of the carina. The lungs are clear without focal pneumonia, edema, pneumothorax or pleural effusion. The cardiopericardial silhouette is within normal limits for size. The visualized bony structures of the thorax are intact. Telemetry leads overlie the chest. IMPRESSION: No active disease. Electronically Signed   By: Misty Stanley M.D.   On: 07/12/2018 13:23    Orson Eva, DO  [facility] Pager [phone]  If 7PM-7AM, please contact night-coverage [email or web address] Password [email or web address] 06/20/2018, 7:58 AM   LOS: 1 day

## 2018-06-20 NOTE — Consult Note (Signed)
Consult requested by: [facility] hospitalist, Dr. Carles Collet Consult requested for: Respiratory failure/ventilator management  HPI: This is an 85 year old who was found unresponsive at home.  She has a history of TIAs Parkinson's disease hypothyroidism, hypertension, diabetes.  History is from the medical record as there is no family available and she is on a ventilator.  She apparently has history of noncompliance with her medications.  Family had seen her on 5 6 and she was sleepy but they thought she was just tired when they went back on 5 7 she was unresponsive and EMS was called.  She was intubated because of airway protection.  She has positive urinalysis negative COVID elevated CPK.  Chest x-ray today that I personally reviewed shows that the endotracheal tube has migrated and is at the entrance of the right mainstem bronchus and needs to be pulled back.  She has been seen by neurology and it is felt that she has multifactorial acute encephalopathy and may have had an acute ischemic stroke.  Family not available by telephone  Past Medical History:  Diagnosis Date  . Anemia    Normocytic; with thrombocytopenia  . Arteriosclerotic cardiovascular disease (ASCVD) 1994   1994-PCI to RCA and LAD; nonsustained ventricular tachycardia;cath in 11/06-30% left main, 50% proximal RCA, normal EF; Echo in 06/2010-mild LVH, hyperdynamic LV function  . Diabetes mellitus, type 2 ([place])   . Gastroesophageal reflux disease    Dysmotility, hiatal hernia; Schatzki's ring-dilated in 02/2010  . Hyperlipidemia   . Hypertension   . Hypothyroid    Hyperthyroidism secondary to excessive replacement  . Nephrolithiasis   . Obesity   . Parkinson disease ([place])   . PSVT (paroxysmal supraventricular tachycardia) ([place]) 2006   S/p RFA-2006  . TIA (transient ischemic attack)   . Tobacco abuse, in remission    50 pack years  . UTI (lower urinary tract infection)      Family History  Problem Relation Age of Onset  . Coronary  artery disease Father   . Stroke Father   . Coronary artery disease Mother        also a sibling  . Heart disease Mother   . Heart disease Sister   . Heart disease Sister   . Heart disease Sister   . Heart disease Sister   . Heart disease Sister   . Heart disease Sister   . Heart disease Brother   . Heart disease Brother   . Heart disease Brother   . Heart disease Brother   . Heart disease Brother   . Healthy Daughter      Social History   Socioeconomic History  . Marital status: Married    Spouse name: Not on file  . Number of children: Not on file  . Years of education: Not on file  . Highest education level: Not on file  Occupational History  . Occupation: Retired    Comment: Systems analyst  Social Needs  . Financial resource strain: Not on file  . Food insecurity:    Worry: Not on file    Inability: Not on file  . Transportation needs:    Medical: Not on file    Non-medical: Not on file  Tobacco Use  . Smoking status: Former Smoker    Packs/day: 1.00    Years: 50.00    Pack years: 50.00    Types: Cigarettes    Last attempt to quit: 05/14/2008    Years since quitting: 10.1  . Smokeless tobacco: Never Used  .  Tobacco comment: Patient smokes 1 to 1 1/2 a day  Substance and Sexual Activity  . Alcohol use: No    Alcohol/week: 0.0 standard drinks  . Drug use: No  . Sexual activity: Yes    Partners: Male  Lifestyle  . Physical activity:    Days per week: Not on file    Minutes per session: Not on file  . Stress: Not on file  Relationships  . Social connections:    Talks on phone: Not on file    Gets together: Not on file    Attends religious service: Not on file    Active member of club or organization: Not on file    Attends meetings of clubs or organizations: Not on file    Relationship status: Not on file  Other Topics Concern  . Not on file  Social History Narrative  . Not on file     ROS: Unobtainable    Objective: Vital signs in last 24  hours: Temp:  [97.9 F (36.6 C)-100.4 F (38 C)] 99.7 F (37.6 C) (05/08 0800) Pulse Rate:  [44-155] 89 (05/08 0800) Resp:  [0-28] 16 (05/08 0800) BP: (76-208)/(30-102) 132/45 (05/08 0800) SpO2:  [75 %-100 %] 100 % (05/08 0800) FiO2 (%):  [40 %-100 %] 40 % (05/08 0519) Weight:  [60.4 kg] 60.4 kg (05/08 0424) Weight change:     Intake/Output from previous day: 05/07 0701 - 05/08 0700 In: 629 [I.V.:579; IV Piggyback:50] Out: 150 [Urine:150]  PHYSICAL EXAM Constitutional: She is intubated sedated and on the ventilator.  Eyes: Pupils react.  Eyes have been deviated.  Ears nose mouth and throat: Mucous membranes are mildly dry.  Cardiovascular: Heart is regular with normal heart sounds.  Respiratory: She is intubated on the ventilator and has bilateral rhonchi.  Gastrointestinal: Her abdomen is soft without masses.  Bowel sounds are sluggish.  Musculoskeletal: Cannot assess.  Neurological: Unresponsive otherwise cannot assess await neurology follow-up psychiatric: Cannot assess  Lab Results: Basic Metabolic Panel: Recent Labs    06/18/2018 1306 06/15/2018 1719 06/20/18 0344  NA 140  --  144  K 4.0  --  4.1  CL 106  --  113*  CO2 18*  --  16*  GLUCOSE 109*  --  140*  BUN 30*  --  30*  CREATININE 1.16*  --  1.21*  CALCIUM 9.6  --  8.7*  MG  --  2.3  --    Liver Function Tests: Recent Labs    06/24/2018 1306  AST 43*  ALT 22  ALKPHOS 83  BILITOT 0.9  PROT 7.1  ALBUMIN 3.8   No results for input(s): LIPASE, AMYLASE in the last 72 hours. No results for input(s): AMMONIA in the last 72 hours. CBC: Recent Labs    06/16/2018 1306 06/20/18 0344  WBC 7.6 9.7  NEUTROABS 6.3  --   HGB 14.3 10.8*  HCT 45.7 34.0*  MCV 93.8 94.4  PLT 195 177   Cardiac Enzymes: Recent Labs    06/18/2018 1306 06/25/2018 1551 07/12/2018 2104 06/20/18 0344  CKTOTAL 1,065*  --   --  721*  TROPONINI  --  0.05* 0.06* 0.06*   BNP: No results for input(s): PROBNP in the last 72 hours. D-Dimer: No  results for input(s): DDIMER in the last 72 hours. CBG: Recent Labs    06/21/2018 1151 06/22/2018 2003 07/05/2018 2337 06/20/18 0334 06/20/18 0746  GLUCAP 79 144* 103* 132* 103*   Hemoglobin A1C: No results for input(s): HGBA1C in  the last 72 hours. Fasting Lipid Panel: Recent Labs    06/20/18 0344  CHOL 129  HDL 34*  LDLCALC 61  TRIG 172*  CHOLHDL 3.8   Thyroid Function Tests: Recent Labs    07/05/2018 1315 07/13/2018 1551  TSH 0.462  --   T4TOTAL  --  9.7   Anemia Panel: Recent Labs    06/20/2018 1745  VITAMINB12 101*   Coagulation: Recent Labs    07/07/2018 1306  LABPROT 13.5  INR 1.0   Urine Drug Screen: Drugs of Abuse     Component Value Date/Time   LABOPIA NONE DETECTED 07/10/2018 1213   COCAINSCRNUR NONE DETECTED 06/21/2018 1213   LABBENZ NONE DETECTED 07/03/2018 1213   AMPHETMU NONE DETECTED 06/16/2018 1213   THCU NONE DETECTED 06/25/2018 1213   LABBARB NONE DETECTED 07/05/2018 1213    Alcohol Level: Recent Labs    06/16/2018 1306  ETH <10   Urinalysis: Recent Labs    06/23/2018 1213  COLORURINE AMBER*  LABSPEC 1.023  PHURINE 5.0  GLUCOSEU NEGATIVE  HGBUR MODERATE*  BILIRUBINUR NEGATIVE  KETONESUR 20*  PROTEINUR 30*  NITRITE POSITIVE*  LEUKOCYTESUR MODERATE*   Misc. Labs:   ABGS: Recent Labs    06/20/18 0500  PHART 7.405  PO2ART 146*  HCO3 22.1     MICROBIOLOGY: Recent Results (from the past 240 hour(s))  SARS Coronavirus 2 (CEPHEID - Performed in [facility] hospital lab), Hosp Order     Status: None   Collection Time: 07/03/2018 12:15 PM  Result Value Ref Range Status   SARS Coronavirus 2 NEGATIVE NEGATIVE Final    Comment: (NOTE) If result is NEGATIVE SARS-CoV-2 target nucleic acids are NOT DETECTED. The SARS-CoV-2 RNA is generally detectable in upper and lower  respiratory specimens during the acute phase of infection. The lowest  concentration of SARS-CoV-2 viral copies this assay can detect is 250  copies / mL. A negative  result does not preclude SARS-CoV-2 infection  and should not be used as the sole basis for treatment or other  patient management decisions.  A negative result may occur with  improper specimen collection / handling, submission of specimen other  than nasopharyngeal swab, presence of viral mutation(s) within the  areas targeted by this assay, and inadequate number of viral copies  (<250 copies / mL). A negative result must be combined with clinical  observations, patient history, and epidemiological information. If result is POSITIVE SARS-CoV-2 target nucleic acids are DETECTED. The SARS-CoV-2 RNA is generally detectable in upper and lower  respiratory specimens dur ing the acute phase of infection.  Positive  results are indicative of active infection with SARS-CoV-2.  Clinical  correlation with patient history and other diagnostic information is  necessary to determine patient infection status.  Positive results do  not rule out bacterial infection or co-infection with other viruses. If result is PRESUMPTIVE POSTIVE SARS-CoV-2 nucleic acids MAY BE PRESENT.   A presumptive positive result was obtained on the submitted specimen  and confirmed on repeat testing.  While 2019 novel coronavirus  (SARS-CoV-2) nucleic acids may be present in the submitted sample  additional confirmatory testing may be necessary for epidemiological  and / or clinical management purposes  to differentiate between  SARS-CoV-2 and other Sarbecovirus currently known to infect humans.  If clinically indicated additional testing with an alternate test  methodology [phone]) is advised. The SARS-CoV-2 RNA is generally  detectable in upper and lower respiratory sp ecimens during the acute  phase of infection. The expected  result is Negative. Fact Sheet for Patients:  StrictlyIdeas.no Fact Sheet for Healthcare Providers: BankingDealers.co.za This test is not yet  approved or cleared by the [place] FDA and has been authorized for detection and/or diagnosis of SARS-CoV-2 by FDA under an Emergency Use Authorization (EUA).  This EUA will remain in effect (meaning this test can be used) for the duration of the COVID-19 declaration under Section 564(b)(1) of the Act, 21 U.S.C. section 360bbb-3(b)(1), unless the authorization is terminated or revoked sooner. Performed at [facility], [address] [place], [place] [address]   MRSA PCR Screening     Status: None   Collection Time: 07/02/2018  7:13 PM  Result Value Ref Range Status   MRSA by PCR NEGATIVE NEGATIVE Final    Comment:        The GeneXpert MRSA Assay (FDA approved for NASAL specimens only), is one component of a comprehensive MRSA colonization surveillance program. It is not intended to diagnose MRSA infection nor to guide or monitor treatment for MRSA infections. Performed at [facility], [address], [place], [place] [address]   Culture, blood (routine x 2)     Status: None (Preliminary result)   Collection Time: 06/14/2018  9:04 PM  Result Value Ref Range Status   Specimen Description BLOOD LEFT HAND  Final   Special Requests   Final    BOTTLES DRAWN AEROBIC AND ANAEROBIC Blood Culture adequate volume   Culture   Final    NO GROWTH < 12 HOURS Performed at [facility], [address] [place], [place] [address]    Report Status PENDING  Incomplete  Culture, blood (routine x 2)     Status: None (Preliminary result)   Collection Time: 06/18/2018  9:05 PM  Result Value Ref Range Status   Specimen Description BLOOD RIGHT HAND  Final   Special Requests   Final    BOTTLES DRAWN AEROBIC AND ANAEROBIC Blood Culture adequate volume   Culture   Final    NO GROWTH < 12 HOURS Performed at [facility], [address], [place],  [address]    Report Status PENDING  Incomplete    Studies/Results: Ct Head Wo Contrast  Result Date: 07/05/2018 CLINICAL DATA:  85 year old  female with a history of altered mental status EXAM: CT HEAD WITHOUT CONTRAST TECHNIQUE: Contiguous axial images were obtained from the base of the skull through the vertex without intravenous contrast. COMPARISON:  12/15/2012 FINDINGS: Brain: No acute intracranial hemorrhage. No midline shift or mass effect. Gray-white differentiation maintained. Progression of senescent volume loss. Patchy hypodensities in the periventricular white matter. Unremarkable appearance of the ventricular system. Vascular: Dense calcifications of anterior and posterior circulation. Skull: No acute fracture.  No aggressive bone lesion identified. Sinuses/Orbits: Unremarkable appearance of the orbits. Mastoid air cells clear. No middle ear effusion. No significant sinus disease. Other: Endotracheal tube in position.  Fluid in the nasopharynx IMPRESSION: Negative for acute intracranial abnormality. Senescent volume loss and evidence of chronic microvascular ischemic disease. Electronically Signed   By: Corrie Mckusick D.O.   On: 06/13/2018 14:11   Dg Chest Port 1 View  Result Date: 06/20/2018 CLINICAL DATA:  Unresponsive. EXAM: PORTABLE CHEST 1 VIEW COMPARISON:  Radiograph Jun 19, 2018. FINDINGS: The heart size and mediastinal contours are within normal limits. Atherosclerosis of thoracic aorta is noted. Endotracheal tube is directed into right mainstem bronchus; withdrawal by 3-4 cm is recommended. Both lungs are clear. The visualized skeletal structures are unremarkable. IMPRESSION: Endotracheal tube directed into right  mainstem bronchus; withdrawal by 3-4 cm is recommended. No acute cardiopulmonary abnormality seen. These results will be called to the ordering clinician or representative by the Radiologist Assistant, and communication documented in the PACS or zVision Dashboard. Electronically Signed   By: Marijo Conception M.D.   On: 06/20/2018 08:16   Dg Chest Portable 1 View  Result Date: 07/10/2018 CLINICAL DATA:  Unresponsive.  EXAM: PORTABLE CHEST 1 VIEW COMPARISON:  06/27/2010 FINDINGS: 1301 hours. Endotracheal tube tip is approximately 1.4 cm above the base of the carina. The lungs are clear without focal pneumonia, edema, pneumothorax or pleural effusion. The cardiopericardial silhouette is within normal limits for size. The visualized bony structures of the thorax are intact. Telemetry leads overlie the chest. IMPRESSION: No active disease. Electronically Signed   By: Misty Stanley M.D.   On: 07/04/2018 13:23    Medications:  Prior to Admission:  Medications Prior to Admission  Medication Sig Dispense Refill Last Dose  . acetaminophen (TYLENOL) 650 MG CR tablet Take 650 mg by mouth 2 (two) times daily as needed for pain.   Taking  . carbidopa-levodopa (SINEMET IR) 10-100 MG tablet Take 1 Table tby mouth 3 Times Daily As Directed for Parkinson's Disease   Taking  . clopidogrel (PLAVIX) 75 MG tablet Take 75 mg by mouth at bedtime.     at unknown  . furosemide (LASIX) 40 MG tablet 40 mg. Take 1/2tablet by mouth twice daily   Taking  . glipiZIDE (GLUCOTROL XL) 10 MG 24 hr tablet TAKE  (1)  TABLET BY MOUTH TWICE A DAY.   Taking  . levothyroxine (SYNTHROID) 100 MCG tablet Take 1 tablet by mouth daily.     Marland Kitchen linagliptin (TRADJENTA) 5 MG TABS tablet Take 5 mg by mouth daily.    Taking  . lisinopril (ZESTRIL) 20 MG tablet Take 1 tablet by mouth daily.    at unknown  . pramipexole (MIRAPEX) 0.75 MG tablet Take 0.75 mg by mouth 3 (three) times daily.   Taking  . pravastatin (PRAVACHOL) 40 MG tablet Take 1 tablet by mouth daily.   Taking  . pregabalin (LYRICA) 100 MG capsule Take 100 mg by mouth 3 (three) times daily.    Taking  . selegiline (ELDEPRYL) 5 MG capsule Take 5 mg by mouth 2 (two) times daily.    Taking  . Vitamin D, Ergocalciferol, (DRISDOL) 50000 UNITS CAPS capsule Take 1 capsule by mouth once a week.   Taking  . tiZANidine (ZANAFLEX) 4 MG tablet 1/2 or 1 Tablet by mout Up To 4 Times Per Day for Muscle Spasms. May  Cause Drowsiness   Taking   Scheduled: . aspirin  300 mg Rectal Daily  . aspirin  325 mg Oral Daily  . carbidopa-levodopa  1 tablet Oral TID  . chlorhexidine gluconate (MEDLINE KIT)  15 mL Mouth Rinse BID  . Chlorhexidine Gluconate Cloth  6 each Topical Q0600  . cyanocobalamin  1,000 mcg Intramuscular Daily  . insulin aspart  0-9 Units Subcutaneous Q4H  . levothyroxine  50 mcg Intravenous Daily  . mouth rinse  15 mL Mouth Rinse 10 times per day   Continuous: . sodium chloride 100 mL/hr at 06/20/18 0840  . cefTRIAXone (ROCEPHIN)  IV    . famotidine (PEPCID) IV Stopped (07/09/2018 2125)  . levETIRAcetam Stopped (06/20/18 7169)   CVE:[id number], fentaNYL (SUBLIMAZE) injection, fentaNYL (SUBLIMAZE) injection, ondansetron (ZOFRAN) IV  Assesment: She has acute encephalopathy probably multifactorial.  There is concerned that she may have had a  stroke or a seizure.  She has history of Parkinson's disease and is not clear if she is taking her medications appropriately but she has been restarted on her meds.  Her encephalopathy does not seem to be any better but this is my first time seeing her so I cannot be certain of that.  She has acute hypoxic respiratory failure and remains intubated.  Her endotracheal tube has migrated so it needs to be pulled back.  She has what appears to be a urinary tract infection that may have set all of this off.  She does have some remote smoking history per the chart so there is a possibility that she has some COPD.  I do not think she needs any acute treatment.  She is not wheezing.  She was down for an unknown period of time and has some rhabdomyolysis. Active Problems:   Lower urinary tract infectious disease   Unresponsiveness   Acute encephalopathy   Acute respiratory failure with hypoxia (HCC)   Rhabdomyolysis    Plan: Continue treatments.  Pull the endotracheal tube back.  Repeat chest x-ray and blood gas in the morning.  Supportive care for now.   She is scheduled for a MRI but I do not think that can be done with her on the ventilator    LOS: 1 day   Jennifer [name] 06/20/2018, 8:36 AM

## 2018-06-20 NOTE — Progress Notes (Signed)
Pt transported to and from CT on vent.  

## 2018-06-20 NOTE — Progress Notes (Signed)
EEG completed, results pending. 

## 2018-06-20 NOTE — Progress Notes (Signed)
[place] A. Merlene Laughter, MD     [email or web address]          Jennifer [name] is an 85 y.o. female.   Assessment/Plan: 1.  Acute encephalopathy: Etiology likely multifactorial including acute UTI and possible acute ischemic stroke or seizures given the patient's eyes are with eyes deviated to the right.    The eye deviation has improved and the repeat imaging does not show a large infarct.  2.  Parkinson disease: The patient's examination indicated she is more parkinsonian off medication today.  The worry is that she could progress into more severe parkinsonism and even neuroleptic malignant syndrome.  We recommend trying to place NG tube again.  This previously was tried in the ED and ICU yesterday but this warrants trying again.  She develops progressive stiffness and fevers treat symptomatically with the muscle relaxer such as IV Robaxin and benzodiazepines.  3.  Vitamin B-12 deficiency: This will be replaced.   Patient overall remains unchanged.  NG tube was attempted a few times both in the ER and in the ICU but without success.   GENERAL: This is a thin lady who is intubated.  The examination is done while the patient is on fentanyl infusion.  HEENT: Neck is supple no trauma appreciated  ABDOMEN: soft  EXTREMITIES: No edema   BACK: Normal  SKIN: Normal by inspection.    MENTAL STATUS: There is no eye opening.  There is no verbal output or attempt to follow commands.  CRANIAL NERVES: Pupils are equal, round and reactive to light; eyes are deviated to the right.  Eyes deviated to the right are persistent and no nystagmus is appreciated.  Extra ocular movements are full with oculocephalic reflexes, upper and lower facial muscles are normal in strength and symmetric, there is no flattening of the nasolabial folds  MOTOR: There is mild flexion withdrawal to deep painful stimuli bilaterally.  COORDINATION: No tremor is noted.    She is noted to have  more persistent, moderate and diffuse rigidity and cogwheeling on both sides.  REFLEXES: Deep tendon reflexes are symmetrical and normal.  Plantar responses are classically upgoing on the left and equivocal on the right.  SENSATION: Normal to pain.    The repeat brain CT scan is reviewed in person.  There is moderate deep white matter and periventricular leukoencephalopathy.  No acute changes are noted.  No change since the previous one done yesterday indicating the evidence of large hemispheric stroke.   EEG shows moderate slowing.  There is also frontocentral delta slowing episodically.   Objective: Vital signs in last 24 hours: Temp:  [98.6 F (37 C)-100.4 F (38 C)] 98.8 F (37.1 C) (05/08 1400) Pulse Rate:  [82-121] 100 (05/08 1400) Resp:  [0-27] 13 (05/08 1400) BP: (76-169)/(30-102) 147/47 (05/08 1400) SpO2:  [95 %-100 %] 100 % (05/08 1544) FiO2 (%):  [30 %-40 %] 30 % (05/08 1544) Weight:  [60.4 kg] 60.4 kg (05/08 0424)  Intake/Output from previous day: 05/07 0701 - 05/08 0700 In: 629 [I.V.:579; IV Piggyback:50] Out: 150 [Urine:150] Intake/Output this shift: Total I/O In: 1238.9 [I.V.:988.8; IV Piggyback:250.2] Out: -  Nutritional status:  Diet Order    None       Lab Results: Results for orders placed or performed during the hospital encounter of 06/17/2018 (from the past 48 hour(s))  CBG monitoring, ED     Status: None   Collection Time: 06/14/2018 11:51 AM  Result Value Ref Range   Glucose-Capillary 79 70 -  99 mg/dL  Urine rapid drug screen (hosp performed)     Status: None   Collection Time: 07/05/2018 12:13 PM  Result Value Ref Range   Opiates NONE DETECTED NONE DETECTED   Cocaine NONE DETECTED NONE DETECTED   Benzodiazepines NONE DETECTED NONE DETECTED   Amphetamines NONE DETECTED NONE DETECTED   Tetrahydrocannabinol NONE DETECTED NONE DETECTED   Barbiturates NONE DETECTED NONE DETECTED    Comment: (NOTE) DRUG SCREEN FOR MEDICAL PURPOSES ONLY.  IF  CONFIRMATION IS NEEDED FOR ANY PURPOSE, NOTIFY LAB WITHIN 5 DAYS. LOWEST DETECTABLE LIMITS FOR URINE DRUG SCREEN Drug Class                     Cutoff (ng/mL) Amphetamine and metabolites    1000 Barbiturate and metabolites    200 Benzodiazepine                 195 Tricyclics and metabolites     300 Opiates and metabolites        300 Cocaine and metabolites        300 THC                            50 Performed at [facility], [address], [place], [place] [address]   Urinalysis, Routine w reflex microscopic     Status: Abnormal   Collection Time: 07/02/2018 12:13 PM  Result Value Ref Range   Color, Urine AMBER (A) YELLOW    Comment: BIOCHEMICALS MAY BE AFFECTED BY COLOR   APPearance CLOUDY (A) CLEAR   Specific Gravity, Urine 1.023 1.005 - 1.030   pH 5.0 5.0 - 8.0   Glucose, UA NEGATIVE NEGATIVE mg/dL   Hgb urine dipstick MODERATE (A) NEGATIVE   Bilirubin Urine NEGATIVE NEGATIVE   Ketones, ur 20 (A) NEGATIVE mg/dL   Protein, ur 30 (A) NEGATIVE mg/dL   Nitrite POSITIVE (A) NEGATIVE   Leukocytes,Ua MODERATE (A) NEGATIVE   RBC / HPF 6-10 0 - 5 RBC/hpf   WBC, UA 21-50 0 - 5 WBC/hpf   Bacteria, UA MANY (A) NONE SEEN   Squamous Epithelial / LPF 6-10 0 - 5   WBC Clumps PRESENT    Mucus PRESENT    Budding Yeast PRESENT    Hyaline Casts, UA PRESENT     Comment: Performed at [facility], [address], [place], [place] [address]  SARS Coronavirus 2 (CEPHEID - Performed in [place] hospital lab), Hosp Order     Status: None   Collection Time: 07/07/2018 12:15 PM  Result Value Ref Range   SARS Coronavirus 2 NEGATIVE NEGATIVE    Comment: (NOTE) If result is NEGATIVE SARS-CoV-2 target nucleic acids are NOT DETECTED. The SARS-CoV-2 RNA is generally detectable in upper and lower  respiratory specimens during the acute phase of infection. The lowest  concentration of SARS-CoV-2 viral copies this assay can detect is 250  copies / mL. A negative result does not preclude SARS-CoV-2  infection  and should not be used as the sole basis for treatment or other  patient management decisions.  A negative result may occur with  improper specimen collection / handling, submission of specimen other  than nasopharyngeal swab, presence of viral mutation(s) within the  areas targeted by this assay, and inadequate number of viral copies  (<250 copies / mL). A negative result must be combined with clinical  observations, patient history, and epidemiological information. If result is POSITIVE SARS-CoV-2 target  nucleic acids are DETECTED. The SARS-CoV-2 RNA is generally detectable in upper and lower  respiratory specimens dur ing the acute phase of infection.  Positive  results are indicative of active infection with SARS-CoV-2.  Clinical  correlation with patient history and other diagnostic information is  necessary to determine patient infection status.  Positive results do  not rule out bacterial infection or co-infection with other viruses. If result is PRESUMPTIVE POSTIVE SARS-CoV-2 nucleic acids MAY BE PRESENT.   A presumptive positive result was obtained on the submitted specimen  and confirmed on repeat testing.  While 2019 novel coronavirus  (SARS-CoV-2) nucleic acids may be present in the submitted sample  additional confirmatory testing may be necessary for epidemiological  and / or clinical management purposes  to differentiate between  SARS-CoV-2 and other Sarbecovirus currently known to infect humans.  If clinically indicated additional testing with an alternate test  methodology [phone]) is advised. The SARS-CoV-2 RNA is generally  detectable in upper and lower respiratory sp ecimens during the acute  phase of infection. The expected result is Negative. Fact Sheet for Patients:  StrictlyIdeas.no Fact Sheet for Healthcare Providers: BankingDealers.co.za This test is not yet approved or cleared by the [place]  FDA and has been authorized for detection and/or diagnosis of SARS-CoV-2 by FDA under an Emergency Use Authorization (EUA).  This EUA will remain in effect (meaning this test can be used) for the duration of the COVID-19 declaration under Section 564(b)(1) of the Act, 21 U.S.C. section 360bbb-3(b)(1), unless the authorization is terminated or revoked sooner. Performed at [facility], [address] [place], [place] [address]   Ethanol     Status: None   Collection Time: 06/15/2018  1:06 PM  Result Value Ref Range   Alcohol, Ethyl (B) <10 <10 mg/dL    Comment: (NOTE) Lowest detectable limit for serum alcohol is 10 mg/dL. For medical purposes only. Performed at [facility], [address], [place], [place] [address]   Protime-INR     Status: None   Collection Time: 06/21/2018  1:06 PM  Result Value Ref Range   Prothrombin Time 13.5 11.4 - 15.2 seconds   INR 1.0 0.8 - 1.2    Comment: (NOTE) INR goal varies based on device and disease states. Performed at [facility], [address] [place], [place] [address]   APTT     Status: None   Collection Time: 06/27/2018  1:06 PM  Result Value Ref Range   aPTT 26 24 - 36 seconds    Comment: Performed at [facility], [address] [place], [place] [address]  CBC     Status: None   Collection Time: 06/13/2018  1:06 PM  Result Value Ref Range   WBC 7.6 4.0 - 10.5 K/uL   RBC 4.87 3.87 - 5.11 MIL/uL   Hemoglobin 14.3 12.0 - 15.0 g/dL   HCT 45.7 36.0 - 46.0 %   MCV 93.8 80.0 - 100.0 fL   MCH 29.4 26.0 - 34.0 pg   MCHC 31.3 30.0 - 36.0 g/dL   RDW 14.2 11.5 - 15.5 %   Platelets 195 150 - 400 K/uL   nRBC 0.0 0.0 - 0.2 %    Comment: Performed at [facility], [address] [place], [place] [address]  Differential     Status: None   Collection Time: 07/07/2018  1:06 PM  Result Value Ref Range   Neutrophils Relative % 84 %   Neutro Abs 6.3 1.7 - 7.7 K/uL  Lymphocytes Relative 9 %   Lymphs Abs 0.7 0.7 - 4.0 K/uL   Monocytes Relative 7 %    Monocytes Absolute 0.5 0.1 - 1.0 K/uL   Eosinophils Relative 0 %   Eosinophils Absolute 0.0 0.0 - 0.5 K/uL   Basophils Relative 0 %   Basophils Absolute 0.0 0.0 - 0.1 K/uL   Immature Granulocytes 0 %   Abs Immature Granulocytes 0.03 0.00 - 0.07 K/uL    Comment: Performed at [facility], [address] [place], [place] [address]  Comprehensive metabolic panel     Status: Abnormal   Collection Time: 06/29/2018  1:06 PM  Result Value Ref Range   Sodium 140 135 - 145 mmol/L   Potassium 4.0 3.5 - 5.1 mmol/L   Chloride 106 98 - 111 mmol/L   CO2 18 (L) 22 - 32 mmol/L   Glucose, Bld 109 (H) 70 - 99 mg/dL   BUN 30 (H) 8 - 23 mg/dL   Creatinine, Ser 1.16 (H) 0.44 - 1.00 mg/dL   Calcium 9.6 8.9 - 10.3 mg/dL   Total Protein 7.1 6.5 - 8.1 g/dL   Albumin 3.8 3.5 - 5.0 g/dL   AST 43 (H) 15 - 41 U/L   ALT 22 0 - 44 U/L   Alkaline Phosphatase 83 38 - 126 U/L   Total Bilirubin 0.9 0.3 - 1.2 mg/dL   GFR calc non Af Amer 43 (L) >60 mL/min   GFR calc Af Amer 49 (L) >60 mL/min   Anion gap 16 (H) 5 - 15    Comment: Performed at [facility], [address] [place], [place] [address]  CK     Status: Abnormal   Collection Time: 07/10/2018  1:06 PM  Result Value Ref Range   Total CK 1,065 (H) 38 - 234 U/L    Comment: Performed at [facility], [address], [place], [place] [address]  Hemoglobin A1c     Status: Abnormal   Collection Time: 06/18/2018  1:06 PM  Result Value Ref Range   Hgb A1c MFr Bld 6.2 (H) 4.8 - 5.6 %    Comment: (NOTE) Pre diabetes:          5.7%-6.4% Diabetes:              >6.4% Glycemic control for   <7.0% adults with diabetes    Mean Plasma Glucose 131.24 mg/dL    Comment: Performed at [facility], [place] [address] [place], [place] [address]  Blood gas, arterial     Status: Abnormal   Collection Time: 06/24/2018  1:15 PM  Result Value Ref Range   FIO2 100.00    pH, Arterial 7.415 7.350 - 7.450   pCO2 arterial 30.0 (L) 32.0 - 48.0 mmHg   pO2, Arterial 407 (H) 83.0 -  108.0 mmHg   Bicarbonate 21.0 20.0 - 28.0 mmol/L   Acid-base deficit 4.9 (H) 0.0 - 2.0 mmol/L   O2 Saturation 99.8 %   Patient temperature 36.4    Allens test (pass/fail) PASS PASS    Comment: Performed at [facility], [address], [place], [place] [address]  TSH     Status: None   Collection Time: 06/18/2018  1:15 PM  Result Value Ref Range   TSH 0.462 0.350 - 4.500 uIU/mL    Comment: Performed by a 3rd Generation assay with a functional sensitivity of <=0.01 uIU/mL. Performed at [facility], [address], [place],  [address]   T4     Status:  None   Collection Time: 06/27/2018  3:51 PM  Result Value Ref Range   T4, Total 9.7 4.5 - 12.0 ug/dL    Comment: (NOTE) Performed At: [facility] [place], [place] [id number] Rush Farmer MD [id number]   Troponin I - Add-On to previous collection     Status: Abnormal   Collection Time: 06/23/2018  3:51 PM  Result Value Ref Range   Troponin I 0.05 (HH) <0.03 ng/mL    Comment: CRITICAL RESULT CALLED TO, READ BACK BY AND VERIFIED WITH: [email or web address].E ON 07/08/2018 AT 1650 BY LOY,C Performed at [facility], [address], [place], [place] [address]   Magnesium     Status: None   Collection Time: 07/02/2018  5:19 PM  Result Value Ref Range   Magnesium 2.3 1.7 - 2.4 mg/dL    Comment: Performed at [facility], [address] [place], [place] [address]  Folate RBC     Status: None   Collection Time: 06/18/2018  5:45 PM  Result Value Ref Range   Folate, Hemolysate 310.0 Not Estab. ng/mL   Hematocrit 39.6 34.0 - 46.6 %   Folate, RBC 783 >498 ng/mL    Comment: (NOTE) Performed At: [facility] [place], [place] [id number] Rush Farmer MD [id number]   Vitamin B12     Status: Abnormal   Collection Time: 07/08/2018  5:45 PM  Result Value Ref Range   Vitamin B-12 101 (L) 180 - 914 pg/mL    Comment: (NOTE) This assay is not validated for testing neonatal or myeloproliferative syndrome specimens  for Vitamin B12 levels. Performed at [facility], [address], [place], [place] [address]   Lactic acid, plasma     Status: None   Collection Time: 06/21/2018  5:45 PM  Result Value Ref Range   Lactic Acid, Venous 1.7 0.5 - 1.9 mmol/L    Comment: Performed at [facility], [address], [place], [place] [address]  MRSA PCR Screening     Status: None   Collection Time: 06/15/2018  7:13 PM  Result Value Ref Range   MRSA by PCR NEGATIVE NEGATIVE    Comment:        The GeneXpert MRSA Assay (FDA approved for NASAL specimens only), is one component of a comprehensive MRSA colonization surveillance program. It is not intended to diagnose MRSA infection nor to guide or monitor treatment for MRSA infections. Performed at [facility], [address], [place], [place] [address]   Glucose, capillary     Status: Abnormal   Collection Time: 06/16/2018  8:03 PM  Result Value Ref Range   Glucose-Capillary 144 (H) 70 - 99 mg/dL  Troponin I - Now Then Q6H     Status: Abnormal   Collection Time: 06/18/2018  9:04 PM  Result Value Ref Range   Troponin I 0.06 (HH) <0.03 ng/mL    Comment: CRITICAL VALUE NOTED.  VALUE IS CONSISTENT WITH PREVIOUSLY REPORTED AND CALLED VALUE. Performed at [facility], [address] [place], [place] [address]   Culture, blood (routine x 2)     Status: None (Preliminary result)   Collection Time: 06/18/2018  9:04 PM  Result Value Ref Range   Specimen Description BLOOD LEFT HAND    Special Requests      BOTTLES DRAWN AEROBIC AND ANAEROBIC Blood Culture adequate volume   Culture      NO GROWTH < 12 HOURS Performed at [facility], [address], [place], [place] [address]  Report Status PENDING   Culture, blood (routine x 2)     Status: None (Preliminary result)   Collection Time: 07/08/2018  9:05 PM  Result Value Ref Range   Specimen Description BLOOD RIGHT HAND    Special Requests      BOTTLES DRAWN AEROBIC AND ANAEROBIC Blood Culture adequate volume   Culture       NO GROWTH < 12 HOURS Performed at [facility], [address], [place], [place] [address]    Report Status PENDING   Glucose, capillary     Status: Abnormal   Collection Time: 06/27/2018 11:37 PM  Result Value Ref Range   Glucose-Capillary 103 (H) 70 - 99 mg/dL  Glucose, capillary     Status: Abnormal   Collection Time: 06/20/18  3:34 AM  Result Value Ref Range   Glucose-Capillary 132 (H) 70 - 99 mg/dL  Troponin I - Now Then Q6H     Status: Abnormal   Collection Time: 06/20/18  3:44 AM  Result Value Ref Range   Troponin I 0.06 (HH) <0.03 ng/mL    Comment: CRITICAL VALUE NOTED.  VALUE IS CONSISTENT WITH PREVIOUSLY REPORTED AND CALLED VALUE. Performed at [facility], [address], [place], [place] [address]   CBC     Status: Abnormal   Collection Time: 06/20/18  3:44 AM  Result Value Ref Range   WBC 9.7 4.0 - 10.5 K/uL   RBC 3.60 (L) 3.87 - 5.11 MIL/uL   Hemoglobin 10.8 (L) 12.0 - 15.0 g/dL   HCT 34.0 (L) 36.0 - 46.0 %   MCV 94.4 80.0 - 100.0 fL   MCH 30.0 26.0 - 34.0 pg   MCHC 31.8 30.0 - 36.0 g/dL   RDW 14.2 11.5 - 15.5 %   Platelets 177 150 - 400 K/uL   nRBC 0.0 0.0 - 0.2 %    Comment: Performed at [facility], [address], [place], [place] [address]  Basic metabolic panel     Status: Abnormal   Collection Time: 06/20/18  3:44 AM  Result Value Ref Range   Sodium 144 135 - 145 mmol/L   Potassium 4.1 3.5 - 5.1 mmol/L   Chloride 113 (H) 98 - 111 mmol/L   CO2 16 (L) 22 - 32 mmol/L   Glucose, Bld 140 (H) 70 - 99 mg/dL   BUN 30 (H) 8 - 23 mg/dL   Creatinine, Ser 1.21 (H) 0.44 - 1.00 mg/dL   Calcium 8.7 (L) 8.9 - 10.3 mg/dL   GFR calc non Af Amer 40 (L) >60 mL/min   GFR calc Af Amer 47 (L) >60 mL/min   Anion gap 15 5 - 15    Comment: Performed at [facility], [address] [place], [place] [address]  CK     Status: Abnormal   Collection Time: 06/20/18  3:44 AM  Result Value Ref Range   Total CK 721 (H) 38 - 234 U/L    Comment: Performed at [facility],  [address], [place], [place] [address]  Lipid panel     Status: Abnormal   Collection Time: 06/20/18  3:44 AM  Result Value Ref Range   Cholesterol 129 0 - 200 mg/dL   Triglycerides 172 (H) <150 mg/dL   HDL 34 (L) >40 mg/dL   Total CHOL/HDL Ratio 3.8 RATIO   VLDL 34 0 - 40 mg/dL   LDL Cholesterol 61 0 - 99 mg/dL    Comment:        Total Cholesterol/HDL:CHD Risk  Coronary Heart Disease Risk Table                     Men   Women  1/2 Average Risk   3.4   3.3  Average Risk       5.0   4.4  2 X Average Risk   9.6   7.1  3 X Average Risk  23.4   11.0        Use the calculated Patient Ratio above and the CHD Risk Table to determine the patient's CHD Risk.        ATP III CLASSIFICATION (LDL):  <100     mg/dL   Optimal  100-129  mg/dL   Near or Above                    Optimal  130-159  mg/dL   Borderline  160-189  mg/dL   High  >190     mg/dL   Very High Performed at [facility] [place], [place] [address]   Blood gas, arterial     Status: Abnormal   Collection Time: 06/20/18  5:00 AM  Result Value Ref Range   FIO2 40.00    pH, Arterial 7.405 7.350 - 7.450   pCO2 arterial 34.0 32.0 - 48.0 mmHg   pO2, Arterial 146 (H) 83.0 - 108.0 mmHg   Bicarbonate 22.1 20.0 - 28.0 mmol/L   Acid-base deficit 3.1 (H) 0.0 - 2.0 mmol/L   O2 Saturation 98.6 %   Patient temperature 37.0    Allens test (pass/fail) PASS PASS    Comment: Performed at [facility], [address], [place], [place] [address]  Glucose, capillary     Status: Abnormal   Collection Time: 06/20/18  7:46 AM  Result Value Ref Range   Glucose-Capillary 103 (H) 70 - 99 mg/dL  Folate     Status: None   Collection Time: 06/20/18  8:54 AM  Result Value Ref Range   Folate 7.3 >5.9 ng/mL    Comment: Performed at [facility], [address] [place], [place] [address]  Hemoglobin A1c     Status: Abnormal   Collection Time: 06/20/18  8:54 AM  Result Value Ref Range   Hgb A1c MFr Bld 6.3 (H) 4.8 - 5.6 %    Comment:  (NOTE) Pre diabetes:          5.7%-6.4% Diabetes:              >6.4% Glycemic control for   <7.0% adults with diabetes    Mean Plasma Glucose 134.11 mg/dL    Comment: Performed at [facility], [place] [address] [place], [place] [address]  Ammonia     Status: None   Collection Time: 06/20/18  8:55 AM  Result Value Ref Range   Ammonia 12 9 - 35 umol/L    Comment: Performed at [facility], [address], [place], [place] [address]  Glucose, capillary     Status: Abnormal   Collection Time: 06/20/18 11:34 AM  Result Value Ref Range   Glucose-Capillary 127 (H) 70 - 99 mg/dL   Comment 1 Notify RN    Comment 2 Document in Chart   Glucose, capillary     Status: None   Collection Time: 06/20/18  3:46 PM  Result Value Ref Range   Glucose-Capillary 94 70 - 99 mg/dL   Comment 1 Notify RN    Comment 2 Document in Chart  Lipid Panel Recent Labs    06/20/18 0344  CHOL 129  TRIG 172*  HDL 34*  CHOLHDL 3.8  VLDL 34  LDLCALC 61    Studies/Results:  REPEAT HEAD CT  FINDINGS: Brain: Stable cerebral atrophy. Negative for acute hemorrhage, mass lesion, midline shift, hydrocephalus or large infarct. Again noted are hypodensities in the white matter suggesting chronic changes. Focal low-density in the right frontal-parietal white matter is compatible with old ischemic changes. Patient's head rotated on this examination.  Vascular: No hyperdense vessel or unexpected calcification.  Skull: Normal. Negative for fracture or focal lesion.  Sinuses/Orbits: No acute finding.  Other: None.  IMPRESSION: 1. No acute intracranial abnormality. 2. Stable atrophy and chronic white matter changes. Findings are most compatible with chronic small vessel ischemic changes.     Medications:  Scheduled Meds: . aspirin  300 mg Rectal Daily  . aspirin  325 mg Oral Daily  . carbidopa-levodopa  1 tablet Oral TID  . chlorhexidine gluconate (MEDLINE KIT)  15 mL Mouth Rinse BID  .  Chlorhexidine Gluconate Cloth  6 each Topical Q0600  . cyanocobalamin  1,000 mcg Intramuscular Daily  . insulin aspart  0-9 Units Subcutaneous Q4H  . levothyroxine  50 mcg Intravenous Daily  . mouth rinse  15 mL Mouth Rinse 10 times per day   Continuous Infusions: . sodium chloride 100 mL/hr at 06/20/18 1500  . cefTRIAXone (ROCEPHIN)  IV Stopped (06/20/18 1338)  . famotidine (PEPCID) IV Stopped (06/20/18 1004)  . levETIRAcetam Stopped (06/20/18 0812)   PRN Meds:.acetaminophen, fentaNYL (SUBLIMAZE) injection, fentaNYL (SUBLIMAZE) injection, ondansetron (ZOFRAN) IV     LOS: 1 day   Roshan Roback A. Merlene Laughter, M.D.  Diplomate, Tax adviser of Psychiatry and Neurology ( Neurology).

## 2018-06-20 NOTE — Progress Notes (Addendum)
Two gold wedding bands removed from patient's left ring finger due to swelling. Placed in plastic bag with name written on it. Placed at bedside.  Order for Fentanyl discontinued. Wasted 200 mL of Fentanyl with Philomena Doheny, RN.

## 2018-06-20 NOTE — Progress Notes (Signed)
Radiologist not available until Monday to help with NG/OG tube insertion.   Also, MRI cannot happen with patient on ventilator at [facility]. Will have to arrange transport to [facility] if MRI necessary.  And a chest xray was repeated after ETT was pulled back due to tubes/wires being in the way making it difficult to read.  Dr. Arbutus Leas made aware of the above.

## 2018-06-20 NOTE — Procedures (Signed)
  [place] A. Merlene Laughter, MD     [email or web address]           HISTORY: This is an 85 year old female who presents with unresponsiveness suspicious for seizures especially in the presence of episode of deviation of the eyes to the right side.  MEDICATIONS:  Current Facility-Administered Medications:  .  0.9 %  sodium chloride infusion, , Intravenous, Continuous, Emokpae, Ejiroghene E, MD, Last Rate: 100 mL/hr at 06/20/18 1418 .  acetaminophen (TYLENOL) tablet 650 mg, 650 mg, Oral, Q4H PRN, Emokpae, Ejiroghene E, MD .  aspirin suppository 300 mg, 300 mg, Rectal, Daily, Tat, David, MD, 300 mg at 06/20/18 0932 .  aspirin tablet 325 mg, 325 mg, Oral, Daily, Ruberta Holck, MD .  carbidopa-levodopa (SINEMET IR) 25-100 MG per tablet immediate release 1 tablet, 1 tablet, Oral, TID, Tabbitha Janvrin, MD .  cefTRIAXone (ROCEPHIN) 1 g in sodium chloride 0.9 % 100 mL IVPB, 1 g, Intravenous, Q24H, Emokpae, Ejiroghene E, MD, Stopped at 06/20/18 1338 .  chlorhexidine gluconate (MEDLINE KIT) (PERIDEX) 0.12 % solution 15 mL, 15 mL, Mouth Rinse, BID, Emokpae, Ejiroghene E, MD, 15 mL at 06/20/18 0759 .  Chlorhexidine Gluconate Cloth 2 % PADS 6 each, 6 each, Topical, Q0600, Emokpae, Ejiroghene E, MD .  cyanocobalamin ((VITAMIN B-12)) injection 1,000 mcg, 1,000 mcg, Intramuscular, Daily, Tat, David, MD, 1,000 mcg at 06/20/18 0930 .  famotidine (PEPCID) IVPB 20 mg premix, 20 mg, Intravenous, Q12H, Emokpae, Ejiroghene E, MD, Stopped at 06/20/18 1004 .  fentaNYL (SUBLIMAZE) injection 25 mcg, 25 mcg, Intravenous, Q15 min PRN, Emokpae, Ejiroghene E, MD .  fentaNYL (SUBLIMAZE) injection 25-100 mcg, 25-100 mcg, Intravenous, Q30 min PRN, Emokpae, Ejiroghene E, MD .  insulin aspart (novoLOG) injection 0-9 Units, 0-9 Units, Subcutaneous, Q4H, Emokpae, Ejiroghene E, MD, 1 Units at 06/20/18 1224 .  levETIRAcetam (KEPPRA) IVPB 500 mg/100 mL premix, 500 mg, Intravenous, Q12H, Phillips Odor, MD, Stopped at  06/20/18 1696 .  levothyroxine (SYNTHROID, LEVOTHROID) injection 50 mcg, 50 mcg, Intravenous, Daily, Tat, David, MD, 50 mcg at 06/20/18 [phone] .  MEDLINE mouth rinse, 15 mL, Mouth Rinse, 10 times per day, Emokpae, Ejiroghene E, MD, 15 mL at 06/20/18 1309 .  ondansetron (ZOFRAN) injection 4 mg, 4 mg, Intravenous, Q6H PRN, Emokpae, Ejiroghene E, MD     ANALYSIS: A 16 channel recording using standard 10 20 measurements is conducted for 22 minutes.  The background activity gets as high as 6 Hz.  The recording is replete with frontocentral delta slowing sometimes associated with triphasic waves.  Vertex sharp waves are also noted.  Photic stimulation and hyperventilation are not conducted.  Focal or lateral slowing are not observed.  No clear epileptiform activity is noted.   IMPRESSION: 1.  This recording shows moderate global slowing indicating a moderate global encephalopathy.  Recently, there is frontocentral slowing and triphasic waves seen both of which are seen in toxic metabolic processes.  There is no epileptiform activity noted however.      Ellanor Feuerstein A. Merlene Laughter, M.D.  Diplomate, [employer] of Psychiatry and Neurology ( Neurology).

## 2018-06-21 ENCOUNTER — Inpatient Hospital Stay (HOSPITAL_COMMUNITY): Payer: Medicare Other

## 2018-06-21 DIAGNOSIS — J9601 Acute respiratory failure with hypoxia: Secondary | ICD-10-CM

## 2018-06-21 LAB — BLOOD GAS, ARTERIAL
Acid-base deficit: 6 mmol/L — ABNORMAL HIGH (ref 0.0–2.0)
Bicarbonate: 19.8 mmol/L — ABNORMAL LOW (ref 20.0–28.0)
FIO2: 30
O2 Saturation: 98.6 %
Patient temperature: 37
pCO2 arterial: 31.2 mmHg — ABNORMAL LOW (ref 32.0–48.0)
pH, Arterial: 7.382 (ref 7.350–7.450)
pO2, Arterial: 125 mmHg — ABNORMAL HIGH (ref 83.0–108.0)

## 2018-06-21 LAB — URINE CULTURE: Culture: 90000 — AB

## 2018-06-21 LAB — BASIC METABOLIC PANEL
Anion gap: 16 — ABNORMAL HIGH (ref 5–15)
BUN: 26 mg/dL — ABNORMAL HIGH (ref 8–23)
CO2: 15 mmol/L — ABNORMAL LOW (ref 22–32)
Calcium: 8.8 mg/dL — ABNORMAL LOW (ref 8.9–10.3)
Chloride: 116 mmol/L — ABNORMAL HIGH (ref 98–111)
Creatinine, Ser: 1 mg/dL (ref 0.44–1.00)
GFR calc Af Amer: 59 mL/min — ABNORMAL LOW (ref >60)
GFR calc non Af Amer: 51 mL/min — ABNORMAL LOW (ref >60)
Glucose, Bld: 135 mg/dL — ABNORMAL HIGH (ref 70–99)
Potassium: 3.8 mmol/L (ref 3.5–5.1)
Sodium: 147 mmol/L — ABNORMAL HIGH (ref 135–145)

## 2018-06-21 LAB — CBC
HCT: 33.4 % — ABNORMAL LOW (ref 36.0–46.0)
Hemoglobin: 10.1 g/dL — ABNORMAL LOW (ref 12.0–15.0)
MCH: 29.1 pg (ref 26.0–34.0)
MCHC: 30.2 g/dL (ref 30.0–36.0)
MCV: 96.3 fL (ref 80.0–100.0)
Platelets: 159 10*3/uL (ref 150–400)
RBC: 3.47 MIL/uL — ABNORMAL LOW (ref 3.87–5.11)
RDW: 14.3 % (ref 11.5–15.5)
WBC: 8.5 10*3/uL (ref 4.0–10.5)
nRBC: 0 % (ref 0.0–0.2)

## 2018-06-21 LAB — GLUCOSE, CAPILLARY
Glucose-Capillary: 127 mg/dL — ABNORMAL HIGH (ref 70–99)
Glucose-Capillary: 105 mg/dL — ABNORMAL HIGH (ref 70–99)
Glucose-Capillary: 120 mg/dL — ABNORMAL HIGH (ref 70–99)
Glucose-Capillary: 143 mg/dL — ABNORMAL HIGH (ref 70–99)
Glucose-Capillary: 145 mg/dL — ABNORMAL HIGH (ref 70–99)

## 2018-06-21 LAB — ECHOCARDIOGRAM LIMITED
Height: 63 in
Weight: 2165.8 oz

## 2018-06-21 LAB — CK: Total CK: 575 U/L — ABNORMAL HIGH (ref 38–234)

## 2018-06-21 MED ORDER — PRAMIPEXOLE DIHYDROCHLORIDE 0.25 MG PO TABS
0.7500 mg | ORAL_TABLET | Freq: Three times a day (TID) | ORAL | Status: DC
  Administered 2018-06-21 – 2018-06-23 (×5): 0.75 mg via ORAL
  Filled 2018-06-21 (×12): qty 3

## 2018-06-21 MED ORDER — KCL IN DEXTROSE-NACL 20-5-0.45 MEQ/L-%-% IV SOLN
INTRAVENOUS | Status: DC
  Administered 2018-06-21 – 2018-06-28 (×7): via INTRAVENOUS

## 2018-06-21 MED ORDER — FAMOTIDINE IN NACL 20-0.9 MG/50ML-% IV SOLN
20.0000 mg | INTRAVENOUS | Status: DC
  Administered 2018-06-22 – 2018-06-27 (×6): 20 mg via INTRAVENOUS
  Filled 2018-06-21 (×6): qty 50

## 2018-06-21 NOTE — Progress Notes (Signed)
Have switched over to PS/CPAP. Patient is not having any trouble breathing on her own. No sedation today She is beginning to move feet., has gag reflex with suction. Arms moving some when pulled. Hands none. Will try to let her basically breath on her on. Suspect she had UTI, became dehydrated  Did not receive Parkinsion's med became lethargic intubated for airway protection

## 2018-06-21 NOTE — Progress Notes (Signed)
Have switched back to rest mode PRVC rate 16, Nurse reported she had slowed down her breathing. Her rate was down to 15 from earlier 20, her VT was up on PS/CPAP which may have adjusted her PH to more normal level ?? Her work of breathing may have decreased?? None the less will let her rest for the night. Saturation still 98, Lungs clear.

## 2018-06-21 NOTE — Progress Notes (Signed)
Asked by Dr. Arbutus Leas to assist in placing NG tube.  Difficulty with placement nasally.  Prior attempt confirmed be coiled in the esophagus by plain film.  History of known Schatzki's ring requiring dilation in the past; moderate size hiatal hernia but no Zenker's on barium swallow or EGD 2011/12.  I attempted to place a 55 French nasally into the stomach.  I had difficulty threading it through the nasopharynx even though I had respiratory therapy deflate the ET balloon.  Multiple attempts.  I subsequently went the oral route with the ET balloon deflated. I guided tube down adjacent to the ET digitally.  Advanced to approximately 25 to 30 cm.  Would not go further.  Auscultation of insufflated air present but somewhat distant.  Hopefully, it is beyond the esophagus.  It may be in the hernia sac.  Check a plain film. Tube not to be used until confirmation confirmed.  Discussed with Dr. Arbutus Leas.

## 2018-06-21 NOTE — Progress Notes (Signed)
Subjective: She was admitted to the hospital with acute encephalopathy which is felt to be multifactorial.  She has what appears to be a urinary tract infection.  She has an abnormal neurological examination but does not have evidence of an acute ischemic stroke on initial or follow-up CT.  She is known to have Parkinson's disease and neurology felt that she had increased parkinsonian exam.  She has not been able to have NG or OG placed.  Apparently it gets down almost to the GE junction and then seems to curl up  Objective: Vital signs in last 24 hours: Temp:  [98.8 F (37.1 C)-99.7 F (37.6 C)] 99.1 F (37.3 C) (05/09 0740) Pulse Rate:  [92-118] 108 (05/09 0740) Resp:  [0-27] 24 (05/09 0740) BP: (86-182)/(32-72) 171/62 (05/09 0500) SpO2:  [97 %-100 %] 100 % (05/09 0740) FiO2 (%):  [30 %] 30 % (05/09 0803) Weight:  [61.4 kg] 61.4 kg (05/09 0400) Weight change: 1 kg    Intake/Output from previous day: 05/08 0701 - 05/09 0700 In: 2664.1 [I.V.:2267.1; IV Piggyback:397] Out: 450 [Urine:450]  PHYSICAL EXAM General appearance: Intubated sedated on mechanical ventilation Resp: She has bilateral rhonchi Cardio: regular rate and rhythm, S1, S2 normal, no murmur, click, rub or gallop GI: soft, non-tender; bowel sounds normal; no masses,  no organomegaly Extremities: She has some swelling of her hands Her eyes are still deviated to the right Lab Results:  Results for orders placed or performed during the hospital encounter of 06/24/2018 (from the past 48 hour(s))  CBG monitoring, ED     Status: None   Collection Time: 06/27/2018 11:51 AM  Result Value Ref Range   Glucose-Capillary 79 70 - 99 mg/dL  Urine rapid drug screen (hosp performed)     Status: None   Collection Time: 07/12/2018 12:13 PM  Result Value Ref Range   Opiates NONE DETECTED NONE DETECTED   Cocaine NONE DETECTED NONE DETECTED   Benzodiazepines NONE DETECTED NONE DETECTED   Amphetamines NONE DETECTED NONE DETECTED    Tetrahydrocannabinol NONE DETECTED NONE DETECTED   Barbiturates NONE DETECTED NONE DETECTED    Comment: (NOTE) DRUG SCREEN FOR MEDICAL PURPOSES ONLY.  IF CONFIRMATION IS NEEDED FOR ANY PURPOSE, NOTIFY LAB WITHIN 5 DAYS. LOWEST DETECTABLE LIMITS FOR URINE DRUG SCREEN Drug Class                     Cutoff (ng/mL) Amphetamine and metabolites    1000 Barbiturate and metabolites    200 Benzodiazepine                 923 Tricyclics and metabolites     300 Opiates and metabolites        300 Cocaine and metabolites        300 THC                            50 Performed at [facility], [address] [place], [place] [address]   Urinalysis, Routine w reflex microscopic     Status: Abnormal   Collection Time: 07/10/2018 12:13 PM  Result Value Ref Range   Color, Urine AMBER (A) YELLOW    Comment: BIOCHEMICALS MAY BE AFFECTED BY COLOR   APPearance CLOUDY (A) CLEAR   Specific Gravity, Urine 1.023 1.005 - 1.030   pH 5.0 5.0 - 8.0   Glucose, UA NEGATIVE NEGATIVE mg/dL   Hgb urine dipstick MODERATE (A) NEGATIVE   Bilirubin Urine NEGATIVE  NEGATIVE   Ketones, ur 20 (A) NEGATIVE mg/dL   Protein, ur 30 (A) NEGATIVE mg/dL   Nitrite POSITIVE (A) NEGATIVE   Leukocytes,Ua MODERATE (A) NEGATIVE   RBC / HPF 6-10 0 - 5 RBC/hpf   WBC, UA 21-50 0 - 5 WBC/hpf   Bacteria, UA MANY (A) NONE SEEN   Squamous Epithelial / LPF 6-10 0 - 5   WBC Clumps PRESENT    Mucus PRESENT    Budding Yeast PRESENT    Hyaline Casts, UA PRESENT     Comment: Performed at [facility], [address], [place], [place] [address]  SARS Coronavirus 2 (CEPHEID - Performed in [place] hospital lab), Hosp Order     Status: None   Collection Time: 06/24/2018 12:15 PM  Result Value Ref Range   SARS Coronavirus 2 NEGATIVE NEGATIVE    Comment: (NOTE) If result is NEGATIVE SARS-CoV-2 target nucleic acids are NOT DETECTED. The SARS-CoV-2 RNA is generally detectable in upper and lower  respiratory specimens during the acute phase of  infection. The lowest  concentration of SARS-CoV-2 viral copies this assay can detect is 250  copies / mL. A negative result does not preclude SARS-CoV-2 infection  and should not be used as the sole basis for treatment or other  patient management decisions.  A negative result may occur with  improper specimen collection / handling, submission of specimen other  than nasopharyngeal swab, presence of viral mutation(s) within the  areas targeted by this assay, and inadequate number of viral copies  (<250 copies / mL). A negative result must be combined with clinical  observations, patient history, and epidemiological information. If result is POSITIVE SARS-CoV-2 target nucleic acids are DETECTED. The SARS-CoV-2 RNA is generally detectable in upper and lower  respiratory specimens dur ing the acute phase of infection.  Positive  results are indicative of active infection with SARS-CoV-2.  Clinical  correlation with patient history and other diagnostic information is  necessary to determine patient infection status.  Positive results do  not rule out bacterial infection or co-infection with other viruses. If result is PRESUMPTIVE POSTIVE SARS-CoV-2 nucleic acids MAY BE PRESENT.   A presumptive positive result was obtained on the submitted specimen  and confirmed on repeat testing.  While 2019 novel coronavirus  (SARS-CoV-2) nucleic acids may be present in the submitted sample  additional confirmatory testing may be necessary for epidemiological  and / or clinical management purposes  to differentiate between  SARS-CoV-2 and other Sarbecovirus currently known to infect humans.  If clinically indicated additional testing with an alternate test  methodology [id number]) is advised. The SARS-CoV-2 RNA is generally  detectable in upper and lower respiratory sp ecimens during the acute  phase of infection. The expected result is Negative. Fact Sheet for Patients:   StrictlyIdeas.no Fact Sheet for Healthcare Providers: BankingDealers.co.za This test is not yet approved or cleared by the [place] FDA and has been authorized for detection and/or diagnosis of SARS-CoV-2 by FDA under an Emergency Use Authorization (EUA).  This EUA will remain in effect (meaning this test can be used) for the duration of the COVID-19 declaration under Section 564(b)(1) of the Act, 21 U.S.C. section 360bbb-3(b)(1), unless the authorization is terminated or revoked sooner. Performed at [facility], [address] [place], [place] [address]   Ethanol     Status: None   Collection Time: 07/11/2018  1:06 PM  Result Value Ref Range   Alcohol, Ethyl (B) <10 <10 mg/dL  Comment: (NOTE) Lowest detectable limit for serum alcohol is 10 mg/dL. For medical purposes only. Performed at [facility], [address], [place], [place] [address]   Protime-INR     Status: None   Collection Time: 06/29/2018  1:06 PM  Result Value Ref Range   Prothrombin Time 13.5 11.4 - 15.2 seconds   INR 1.0 0.8 - 1.2    Comment: (NOTE) INR goal varies based on device and disease states. Performed at [facility], [address], [place], [place] [address]   APTT     Status: None   Collection Time: 06/16/2018  1:06 PM  Result Value Ref Range   aPTT 26 24 - 36 seconds    Comment: Performed at [facility], [address], [place], [place] [address]  CBC     Status: None   Collection Time: 06/13/2018  1:06 PM  Result Value Ref Range   WBC 7.6 4.0 - 10.5 K/uL   RBC 4.87 3.87 - 5.11 MIL/uL   Hemoglobin 14.3 12.0 - 15.0 g/dL   HCT 45.7 36.0 - 46.0 %   MCV 93.8 80.0 - 100.0 fL   MCH 29.4 26.0 - 34.0 pg   MCHC 31.3 30.0 - 36.0 g/dL   RDW 14.2 11.5 - 15.5 %   Platelets 195 150 - 400 K/uL   nRBC 0.0 0.0 - 0.2 %    Comment: Performed at [facility], [address], [place], [place] [address]  Differential     Status: None   Collection Time: 07/04/2018  1:06  PM  Result Value Ref Range   Neutrophils Relative % 84 %   Neutro Abs 6.3 1.7 - 7.7 K/uL   Lymphocytes Relative 9 %   Lymphs Abs 0.7 0.7 - 4.0 K/uL   Monocytes Relative 7 %   Monocytes Absolute 0.5 0.1 - 1.0 K/uL   Eosinophils Relative 0 %   Eosinophils Absolute 0.0 0.0 - 0.5 K/uL   Basophils Relative 0 %   Basophils Absolute 0.0 0.0 - 0.1 K/uL   Immature Granulocytes 0 %   Abs Immature Granulocytes 0.03 0.00 - 0.07 K/uL    Comment: Performed at [facility], [address], [place], [place] [address]  Comprehensive metabolic panel     Status: Abnormal   Collection Time: 06/27/2018  1:06 PM  Result Value Ref Range   Sodium 140 135 - 145 mmol/L   Potassium 4.0 3.5 - 5.1 mmol/L   Chloride 106 98 - 111 mmol/L   CO2 18 (L) 22 - 32 mmol/L   Glucose, Bld 109 (H) 70 - 99 mg/dL   BUN 30 (H) 8 - 23 mg/dL   Creatinine, Ser 1.16 (H) 0.44 - 1.00 mg/dL   Calcium 9.6 8.9 - 10.3 mg/dL   Total Protein 7.1 6.5 - 8.1 g/dL   Albumin 3.8 3.5 - 5.0 g/dL   AST 43 (H) 15 - 41 U/L   ALT 22 0 - 44 U/L   Alkaline Phosphatase 83 38 - 126 U/L   Total Bilirubin 0.9 0.3 - 1.2 mg/dL   GFR calc non Af Amer 43 (L) >60 mL/min   GFR calc Af Amer 49 (L) >60 mL/min   Anion gap 16 (H) 5 - 15    Comment: Performed at [facility], [address] [place], [place] [address]  CK     Status: Abnormal   Collection Time: 07/02/2018  1:06 PM  Result Value Ref Range   Total CK 1,065 (H) 38 - 234 U/L  Comment: Performed at [facility], [address] [place], [place] [address]  Hemoglobin A1c     Status: Abnormal   Collection Time: 07/06/2018  1:06 PM  Result Value Ref Range   Hgb A1c MFr Bld 6.2 (H) 4.8 - 5.6 %    Comment: (NOTE) Pre diabetes:          5.7%-6.4% Diabetes:              >6.4% Glycemic control for   <7.0% adults with diabetes    Mean Plasma Glucose 131.24 mg/dL    Comment: Performed at [facility] [address] [place], [place] [address]  Blood gas, arterial     Status: Abnormal    Collection Time: 07/11/2018  1:15 PM  Result Value Ref Range   FIO2 100.00    pH, Arterial 7.415 7.350 - 7.450   pCO2 arterial 30.0 (L) 32.0 - 48.0 mmHg   pO2, Arterial 407 (H) 83.0 - 108.0 mmHg   Bicarbonate 21.0 20.0 - 28.0 mmol/L   Acid-base deficit 4.9 (H) 0.0 - 2.0 mmol/L   O2 Saturation 99.8 %   Patient temperature 36.4    Allens test (pass/fail) PASS PASS    Comment: Performed at [facility], [address] [place], [place] [address]  TSH     Status: None   Collection Time: 06/21/2018  1:15 PM  Result Value Ref Range   TSH 0.462 0.350 - 4.500 uIU/mL    Comment: Performed by a 3rd Generation assay with a functional sensitivity of <=0.01 uIU/mL. Performed at [facility], [address], [place], [place] [address]   Urine culture     Status: Abnormal (Preliminary result)   Collection Time: 07/08/2018  2:38 PM  Result Value Ref Range   Specimen Description      URINE, CLEAN CATCH Performed at [facility], [address] [place], [place] [address]    Special Requests      NONE Performed at [facility], [address] [place], [place] [address]    Culture (A)     90,000 COLONIES/mL GRAM NEGATIVE RODS IDENTIFICATION AND SUSCEPTIBILITIES TO FOLLOW Performed at [facility] [address], [place], [place] [address]    Report Status PENDING   T4     Status: None   Collection Time: 07/07/2018  3:51 PM  Result Value Ref Range   T4, Total 9.7 4.5 - 12.0 ug/dL    Comment: (NOTE) Performed At: [facility] [place], [place] [id number] Rush Farmer MD [id number]   Troponin I - Add-On to previous collection     Status: Abnormal   Collection Time: 07/06/2018  3:51 PM  Result Value Ref Range   Troponin I 0.05 (HH) <0.03 ng/mL    Comment: CRITICAL RESULT CALLED TO, READ BACK BY AND VERIFIED WITH: WILEY.E ON 07/04/2018 AT 1650 BY LOY,C Performed at [facility], [address] [place], [place] [address]   Magnesium     Status: None   Collection Time:  07/10/2018  5:19 PM  Result Value Ref Range   Magnesium 2.3 1.7 - 2.4 mg/dL    Comment: Performed at [facility], [address] [place],  [address]  Folate RBC     Status: None   Collection Time: 07/03/2018  5:45 PM  Result Value Ref Range   Folate, Hemolysate 310.0 Not Estab. ng/mL   Hematocrit 39.6 34.0 - 46.6 %   Folate, RBC 783 >498 ng/mL    Comment: (  NOTE) Performed At: [facility] [place], [place] [id number] Rush Farmer MD [id number]   Vitamin B12     Status: Abnormal   Collection Time: 07/07/2018  5:45 PM  Result Value Ref Range   Vitamin B-12 101 (L) 180 - 914 pg/mL    Comment: (NOTE) This assay is not validated for testing neonatal or myeloproliferative syndrome specimens for Vitamin B12 levels. Performed at [facility], [address], [place], [place] [address]   Lactic acid, plasma     Status: None   Collection Time: 07/12/2018  5:45 PM  Result Value Ref Range   Lactic Acid, Venous 1.7 0.5 - 1.9 mmol/L    Comment: Performed at [facility] [address], [place], [place] [address]  MRSA PCR Screening     Status: None   Collection Time: 06/18/2018  7:13 PM  Result Value Ref Range   MRSA by PCR NEGATIVE NEGATIVE    Comment:        The GeneXpert MRSA Assay (FDA approved for NASAL specimens only), is one component of a comprehensive MRSA colonization surveillance program. It is not intended to diagnose MRSA infection nor to guide or monitor treatment for MRSA infections. Performed at [facility], [address], [place], [place] [address]   Glucose, capillary     Status: Abnormal   Collection Time: 06/25/2018  8:03 PM  Result Value Ref Range   Glucose-Capillary 144 (H) 70 - 99 mg/dL  Troponin I - Now Then Q6H     Status: Abnormal   Collection Time: 07/06/2018  9:04 PM  Result Value Ref Range   Troponin I 0.06 (HH) <0.03 ng/mL    Comment: CRITICAL VALUE NOTED.  VALUE IS CONSISTENT WITH PREVIOUSLY REPORTED AND CALLED  VALUE. Performed at [facility], [address] [place], [place] [address]   Culture, blood (routine x 2)     Status: None (Preliminary result)   Collection Time: 06/17/2018  9:04 PM  Result Value Ref Range   Specimen Description BLOOD LEFT HAND    Special Requests      BOTTLES DRAWN AEROBIC AND ANAEROBIC Blood Culture adequate volume   Culture      NO GROWTH 2 DAYS Performed at [facility], [address] [place], [place] [address]    Report Status PENDING   Culture, blood (routine x 2)     Status: None (Preliminary result)   Collection Time: 07/05/2018  9:05 PM  Result Value Ref Range   Specimen Description BLOOD RIGHT HAND    Special Requests      BOTTLES DRAWN AEROBIC AND ANAEROBIC Blood Culture adequate volume   Culture      NO GROWTH 2 DAYS Performed at [facility], [address] [place], [place] [address]    Report Status PENDING   Glucose, capillary     Status: Abnormal   Collection Time: 07/13/2018 11:37 PM  Result Value Ref Range   Glucose-Capillary 103 (H) 70 - 99 mg/dL  Glucose, capillary     Status: Abnormal   Collection Time: 06/20/18  3:34 AM  Result Value Ref Range   Glucose-Capillary 132 (H) 70 - 99 mg/dL  Troponin I - Now Then Q6H     Status: Abnormal   Collection Time: 06/20/18  3:44 AM  Result Value Ref Range   Troponin I 0.06 (HH) <0.03 ng/mL    Comment: CRITICAL VALUE NOTED.  VALUE IS CONSISTENT WITH PREVIOUSLY REPORTED AND CALLED VALUE. Performed at [facility], [place]  [address] [place], [place] [address]   CBC     Status: Abnormal   Collection Time: 06/20/18  3:44 AM  Result Value Ref Range   WBC 9.7 4.0 - 10.5 K/uL   RBC 3.60 (L) 3.87 - 5.11 MIL/uL   Hemoglobin 10.8 (L) 12.0 - 15.0 g/dL   HCT 34.0 (L) 36.0 - 46.0 %   MCV 94.4 80.0 - 100.0 fL   MCH 30.0 26.0 - 34.0 pg   MCHC 31.8 30.0 - 36.0 g/dL   RDW 14.2 11.5 - 15.5 %   Platelets 177 150 - 400 K/uL   nRBC 0.0 0.0 - 0.2 %    Comment: Performed at [facility], [address]  [place], [place] [address]  Basic metabolic panel     Status: Abnormal   Collection Time: 06/20/18  3:44 AM  Result Value Ref Range   Sodium 144 135 - 145 mmol/L   Potassium 4.1 3.5 - 5.1 mmol/L   Chloride 113 (H) 98 - 111 mmol/L   CO2 16 (L) 22 - 32 mmol/L   Glucose, Bld 140 (H) 70 - 99 mg/dL   BUN 30 (H) 8 - 23 mg/dL   Creatinine, Ser 1.21 (H) 0.44 - 1.00 mg/dL   Calcium 8.7 (L) 8.9 - 10.3 mg/dL   GFR calc non Af Amer 40 (L) >60 mL/min   GFR calc Af Amer 47 (L) >60 mL/min   Anion gap 15 5 - 15    Comment: Performed at [facility], [address], [place], [place] [address]  CK     Status: Abnormal   Collection Time: 06/20/18  3:44 AM  Result Value Ref Range   Total CK 721 (H) 38 - 234 U/L    Comment: Performed at [facility], [address], [place], [place] [address]  Lipid panel     Status: Abnormal   Collection Time: 06/20/18  3:44 AM  Result Value Ref Range   Cholesterol 129 0 - 200 mg/dL   Triglycerides 172 (H) <150 mg/dL   HDL 34 (L) >40 mg/dL   Total CHOL/HDL Ratio 3.8 RATIO   VLDL 34 0 - 40 mg/dL   LDL Cholesterol 61 0 - 99 mg/dL    Comment:        Total Cholesterol/HDL:CHD Risk Coronary Heart Disease Risk Table                     Men   Women  1/2 Average Risk   3.4   3.3  Average Risk       5.0   4.4  2 X Average Risk   9.6   7.1  3 X Average Risk  23.4   11.0        Use the calculated Patient Ratio above and the CHD Risk Table to determine the patient's CHD Risk.        ATP III CLASSIFICATION (LDL):  <100     mg/dL   Optimal  100-129  mg/dL   Near or Above                    Optimal  130-159  mg/dL   Borderline  160-189  mg/dL   High  >190     mg/dL   Very High Performed at [facility], [address], [place], [place] [address]   Blood gas, arterial     Status: Abnormal   Collection Time: 06/20/18  5:00 AM  Result Value Ref Range  FIO2 40.00    pH, Arterial 7.405 7.350 - 7.450   pCO2 arterial 34.0 32.0 - 48.0 mmHg   pO2, Arterial 146 (H) 83.0 - 108.0  mmHg   Bicarbonate 22.1 20.0 - 28.0 mmol/L   Acid-base deficit 3.1 (H) 0.0 - 2.0 mmol/L   O2 Saturation 98.6 %   Patient temperature 37.0    Allens test (pass/fail) PASS PASS    Comment: Performed at [facility], [address] [place], [place] [address]  Glucose, capillary     Status: Abnormal   Collection Time: 06/20/18  7:46 AM  Result Value Ref Range   Glucose-Capillary 103 (H) 70 - 99 mg/dL  Folate     Status: None   Collection Time: 06/20/18  8:54 AM  Result Value Ref Range   Folate 7.3 >5.9 ng/mL    Comment: Performed at [facility], [address], [place], [place] [address]  Hemoglobin A1c     Status: Abnormal   Collection Time: 06/20/18  8:54 AM  Result Value Ref Range   Hgb A1c MFr Bld 6.3 (H) 4.8 - 5.6 %    Comment: (NOTE) Pre diabetes:          5.7%-6.4% Diabetes:              >6.4% Glycemic control for   <7.0% adults with diabetes    Mean Plasma Glucose 134.11 mg/dL    Comment: Performed at [facility] [address], [place], [place] [address]  Ammonia     Status: None   Collection Time: 06/20/18  8:55 AM  Result Value Ref Range   Ammonia 12 9 - 35 umol/L    Comment: Performed at [facility], [address], [place], [place] [address]  Glucose, capillary     Status: Abnormal   Collection Time: 06/20/18 11:34 AM  Result Value Ref Range   Glucose-Capillary 127 (H) 70 - 99 mg/dL   Comment 1 Notify RN    Comment 2 Document in Chart   Glucose, capillary     Status: None   Collection Time: 06/20/18  3:46 PM  Result Value Ref Range   Glucose-Capillary 94 70 - 99 mg/dL   Comment 1 Notify RN    Comment 2 Document in Chart   Glucose, capillary     Status: Abnormal   Collection Time: 06/20/18  8:05 PM  Result Value Ref Range   Glucose-Capillary 108 (H) 70 - 99 mg/dL   Comment 1 Notify RN    Comment 2 Document in Chart   Glucose, capillary     Status: Abnormal   Collection Time: 06/20/18 11:46 PM  Result Value Ref Range   Glucose-Capillary 117 (H) 70  - 99 mg/dL   Comment 1 Notify RN    Comment 2 Document in Chart   Glucose, capillary     Status: Abnormal   Collection Time: 06/21/18  4:43 AM  Result Value Ref Range   Glucose-Capillary 127 (H) 70 - 99 mg/dL   Comment 1 Notify RN    Comment 2 Document in Chart   Basic metabolic panel     Status: Abnormal   Collection Time: 06/21/18  4:51 AM  Result Value Ref Range   Sodium 147 (H) 135 - 145 mmol/L   Potassium 3.8 3.5 - 5.1 mmol/L   Chloride 116 (H) 98 - 111 mmol/L   CO2 15 (L) 22 - 32 mmol/L   Glucose, Bld 135 (H) 70 - 99 mg/dL   BUN 26 (  H) 8 - 23 mg/dL   Creatinine, Ser 1.00 0.44 - 1.00 mg/dL   Calcium 8.8 (L) 8.9 - 10.3 mg/dL   GFR calc non Af Amer 51 (L) >60 mL/min   GFR calc Af Amer 59 (L) >60 mL/min   Anion gap 16 (H) 5 - 15    Comment: Performed at [facility], [address], [place], [place] [address]  CBC     Status: Abnormal   Collection Time: 06/21/18  4:51 AM  Result Value Ref Range   WBC 8.5 4.0 - 10.5 K/uL   RBC 3.47 (L) 3.87 - 5.11 MIL/uL   Hemoglobin 10.1 (L) 12.0 - 15.0 g/dL   HCT 33.4 (L) 36.0 - 46.0 %   MCV 96.3 80.0 - 100.0 fL   MCH 29.1 26.0 - 34.0 pg   MCHC 30.2 30.0 - 36.0 g/dL   RDW 14.3 11.5 - 15.5 %   Platelets 159 150 - 400 K/uL   nRBC 0.0 0.0 - 0.2 %    Comment: Performed at [facility], [address] [place], [place] [address]  CK     Status: Abnormal   Collection Time: 06/21/18  4:51 AM  Result Value Ref Range   Total CK 575 (H) 38 - 234 U/L    Comment: Performed at [facility], [address], [place], [place] [address]  Blood gas, arterial     Status: Abnormal   Collection Time: 06/21/18  5:59 AM  Result Value Ref Range   FIO2 30.00    pH, Arterial 7.382 7.350 - 7.450   pCO2 arterial 31.2 (L) 32.0 - 48.0 mmHg   pO2, Arterial 125 (H) 83.0 - 108.0 mmHg   Bicarbonate 19.8 (L) 20.0 - 28.0 mmol/L   Acid-base deficit 6.0 (H) 0.0 - 2.0 mmol/L   O2 Saturation 98.6 %   Patient temperature 37.0    Allens test (pass/fail) PASS PASS     Comment: Performed at [facility], [address] [place],  [address]  Glucose, capillary     Status: Abnormal   Collection Time: 06/21/18  7:31 AM  Result Value Ref Range   Glucose-Capillary 105 (H) 70 - 99 mg/dL    ABGS Recent Labs    06/21/18 0559  PHART 7.382  PO2ART 125*  HCO3 19.8*   CULTURES Recent Results (from the past 240 hour(s))  SARS Coronavirus 2 (CEPHEID - Performed in [place] hospital lab), Hosp Order     Status: None   Collection Time: 06/22/2018 12:15 PM  Result Value Ref Range Status   SARS Coronavirus 2 NEGATIVE NEGATIVE Final    Comment: (NOTE) If result is NEGATIVE SARS-CoV-2 target nucleic acids are NOT DETECTED. The SARS-CoV-2 RNA is generally detectable in upper and lower  respiratory specimens during the acute phase of infection. The lowest  concentration of SARS-CoV-2 viral copies this assay can detect is 250  copies / mL. A negative result does not preclude SARS-CoV-2 infection  and should not be used as the sole basis for treatment or other  patient management decisions.  A negative result may occur with  improper specimen collection / handling, submission of specimen other  than nasopharyngeal swab, presence of viral mutation(s) within the  areas targeted by this assay, and inadequate number of viral copies  (<250 copies / mL). A negative result must be combined with clinical  observations, patient history, and epidemiological information. If result is POSITIVE SARS-CoV-2 target nucleic acids are DETECTED. The SARS-CoV-2 RNA is generally detectable in  upper and lower  respiratory specimens dur ing the acute phase of infection.  Positive  results are indicative of active infection with SARS-CoV-2.  Clinical  correlation with patient history and other diagnostic information is  necessary to determine patient infection status.  Positive results do  not rule out bacterial infection or co-infection with other viruses. If result is  PRESUMPTIVE POSTIVE SARS-CoV-2 nucleic acids MAY BE PRESENT.   A presumptive positive result was obtained on the submitted specimen  and confirmed on repeat testing.  While 2019 novel coronavirus  (SARS-CoV-2) nucleic acids may be present in the submitted sample  additional confirmatory testing may be necessary for epidemiological  and / or clinical management purposes  to differentiate between  SARS-CoV-2 and other Sarbecovirus currently known to infect humans.  If clinically indicated additional testing with an alternate test  methodology [phone]) is advised. The SARS-CoV-2 RNA is generally  detectable in upper and lower respiratory sp ecimens during the acute  phase of infection. The expected result is Negative. Fact Sheet for Patients:  StrictlyIdeas.no Fact Sheet for Healthcare Providers: BankingDealers.co.za This test is not yet approved or cleared by the [place] FDA and has been authorized for detection and/or diagnosis of SARS-CoV-2 by FDA under an Emergency Use Authorization (EUA).  This EUA will remain in effect (meaning this test can be used) for the duration of the COVID-19 declaration under Section 564(b)(1) of the Act, 21 U.S.C. section 360bbb-3(b)(1), unless the authorization is terminated or revoked sooner. Performed at [facility], [address] [place], [place] [address]   Urine culture     Status: Abnormal (Preliminary result)   Collection Time: 06/17/2018  2:38 PM  Result Value Ref Range Status   Specimen Description   Final    URINE, CLEAN CATCH Performed at [facility], [address] [place], [place] [address]    Special Requests   Final    NONE Performed at [facility], [address] [place], [place] [address]    Culture (A)  Final    90,000 COLONIES/mL GRAM NEGATIVE RODS IDENTIFICATION AND SUSCEPTIBILITIES TO FOLLOW Performed at [facility], [place] [address], [place], [place] [address]     Report Status PENDING  Incomplete  MRSA PCR Screening     Status: None   Collection Time: 07/10/2018  7:13 PM  Result Value Ref Range Status   MRSA by PCR NEGATIVE NEGATIVE Final    Comment:        The GeneXpert MRSA Assay (FDA approved for NASAL specimens only), is one component of a comprehensive MRSA colonization surveillance program. It is not intended to diagnose MRSA infection nor to guide or monitor treatment for MRSA infections. Performed at [facility], [address], [place], [place] [address]   Culture, blood (routine x 2)     Status: None (Preliminary result)   Collection Time: 07/01/2018  9:04 PM  Result Value Ref Range Status   Specimen Description BLOOD LEFT HAND  Final   Special Requests   Final    BOTTLES DRAWN AEROBIC AND ANAEROBIC Blood Culture adequate volume   Culture   Final    NO GROWTH 2 DAYS Performed at [facility], [address] [place], [place] [address]    Report Status PENDING  Incomplete  Culture, blood (routine x 2)     Status: None (Preliminary result)   Collection Time: 07/02/2018  9:05 PM  Result Value Ref Range Status   Specimen Description BLOOD RIGHT HAND  Final  Special Requests   Final    BOTTLES DRAWN AEROBIC AND ANAEROBIC Blood Culture adequate volume   Culture   Final    NO GROWTH 2 DAYS Performed at [facility], [address], [place], [place] [address]    Report Status PENDING  Incomplete   Studies/Results: Ct Head Wo Contrast  Result Date: 06/20/2018 CLINICAL DATA:  85 year old with altered mental status. EXAM: CT HEAD WITHOUT CONTRAST TECHNIQUE: Contiguous axial images were obtained from the base of the skull through the vertex without intravenous contrast. COMPARISON:  06/18/2018 FINDINGS: Brain: Stable cerebral atrophy. Negative for acute hemorrhage, mass lesion, midline shift, hydrocephalus or large infarct. Again noted are hypodensities in the white matter suggesting chronic changes. Focal low-density in the right  frontal-parietal white matter is compatible with old ischemic changes. Patient's head rotated on this examination. Vascular: No hyperdense vessel or unexpected calcification. Skull: Normal. Negative for fracture or focal lesion. Sinuses/Orbits: No acute finding. Other: None. IMPRESSION: 1. No acute intracranial abnormality. 2. Stable atrophy and chronic white matter changes. Findings are most compatible with chronic small vessel ischemic changes. Electronically Signed   By: Markus Daft M.D.   On: 06/20/2018 15:55   Ct Head Wo Contrast  Result Date: 06/18/2018 CLINICAL DATA:  85 year old female with a history of altered mental status EXAM: CT HEAD WITHOUT CONTRAST TECHNIQUE: Contiguous axial images were obtained from the base of the skull through the vertex without intravenous contrast. COMPARISON:  12/15/2012 FINDINGS: Brain: No acute intracranial hemorrhage. No midline shift or mass effect. Gray-white differentiation maintained. Progression of senescent volume loss. Patchy hypodensities in the periventricular white matter. Unremarkable appearance of the ventricular system. Vascular: Dense calcifications of anterior and posterior circulation. Skull: No acute fracture.  No aggressive bone lesion identified. Sinuses/Orbits: Unremarkable appearance of the orbits. Mastoid air cells clear. No middle ear effusion. No significant sinus disease. Other: Endotracheal tube in position.  Fluid in the nasopharynx IMPRESSION: Negative for acute intracranial abnormality. Senescent volume loss and evidence of chronic microvascular ischemic disease. Electronically Signed   By: Corrie Mckusick D.O.   On: 06/21/2018 14:11   Dg Chest Port 1 View  Result Date: 06/20/2018 CLINICAL DATA:  85 year old female with repositioning of the endotracheal tube EXAM: PORTABLE CHEST 1 VIEW COMPARISON:  06/20/2018 FINDINGS: Cardiomediastinal silhouette unchanged in size and contour. No evidence of central vascular congestion. The endotracheal tube  has been slightly withdrawn, now terminating 2.7 cm above the carina. There is a linear contour of the right lung of uncertain significance. There are overlying surgical lines tubes in EKG leads of the right chest somewhat obscuring evaluation. Low lung volumes. Pleuroparenchymal opacity of the right lung, may represent chronic changes and some pleural fluid. No new airspace disease. IMPRESSION: Endotracheal tube has been slightly withdrawn now terminating 2.7 cm above the carina. There is linear contour of the right lung extending in a convex configuration towards the costophrenic angle. While this is favored to represent artifact, a right pneumothorax cannot be excluded. Correlation with physical exam may be useful, as well as a repeat chest x-ray with adequate positioning and removal of the obscuring surgical line/hardware and EKG leads. These results were discussed by telephone at the time of interpretation on 06/20/2018 at 9:21 am with the nurse caring for the patient, Ms Sherry Ruffing. Electronically Signed   By: Corrie Mckusick D.O.   On: 06/20/2018 09:21   Dg Chest Port 1 View  Result Date: 06/20/2018 CLINICAL DATA:  Unresponsive. EXAM: PORTABLE CHEST 1 VIEW COMPARISON:  Radiograph Jun 19, 2018. FINDINGS: The heart size and mediastinal contours are within normal limits. Atherosclerosis of thoracic aorta is noted. Endotracheal tube is directed into right mainstem bronchus; withdrawal by 3-4 cm is recommended. Both lungs are clear. The visualized skeletal structures are unremarkable. IMPRESSION: Endotracheal tube directed into right mainstem bronchus; withdrawal by 3-4 cm is recommended. No acute cardiopulmonary abnormality seen. These results will be called to the ordering clinician or representative by the Radiologist Assistant, and communication documented in the PACS or zVision Dashboard. Electronically Signed   By: Marijo Conception M.D.   On: 06/20/2018 08:16   Dg Chest Portable 1 View  Result Date:  06/24/2018 CLINICAL DATA:  Unresponsive. EXAM: PORTABLE CHEST 1 VIEW COMPARISON:  06/27/2010 FINDINGS: 1301 hours. Endotracheal tube tip is approximately 1.4 cm above the base of the carina. The lungs are clear without focal pneumonia, edema, pneumothorax or pleural effusion. The cardiopericardial silhouette is within normal limits for size. The visualized bony structures of the thorax are intact. Telemetry leads overlie the chest. IMPRESSION: No active disease. Electronically Signed   By: Misty Stanley M.D.   On: 07/06/2018 13:23   Dg Chest Port 1v Same Day  Result Date: 06/20/2018 CLINICAL DATA:  Intubated patient. Possible pneumothorax on plain film of the chest earlier today. EXAM: PORTABLE CHEST 1 VIEW COMPARISON:  Single-view of the chest earlier today. FINDINGS: Endotracheal tube is in place with the tip well above the carina. There is no pneumothorax. Finding described on the prior report was likely a skin fold. Lungs clear. Heart size normal. IMPRESSION: Negative for pneumothorax.  No acute disease. ETT in good position. Electronically Signed   By: Inge Rise M.D.   On: 06/20/2018 10:16    Medications:  Prior to Admission:  Medications Prior to Admission  Medication Sig Dispense Refill Last Dose  . acetaminophen (TYLENOL) 650 MG CR tablet Take 650 mg by mouth 2 (two) times daily as needed for pain.   Taking  . carbidopa-levodopa (SINEMET IR) 10-100 MG tablet Take 1 Table tby mouth 3 Times Daily As Directed for Parkinson's Disease   Taking  . clopidogrel (PLAVIX) 75 MG tablet Take 75 mg by mouth at bedtime.     at unknown  . furosemide (LASIX) 40 MG tablet 40 mg. Take 1/2tablet by mouth twice daily   Taking  . glipiZIDE (GLUCOTROL XL) 10 MG 24 hr tablet TAKE  (1)  TABLET BY MOUTH TWICE A DAY.   Taking  . levothyroxine (SYNTHROID) 100 MCG tablet Take 1 tablet by mouth daily.     Marland Kitchen linagliptin (TRADJENTA) 5 MG TABS tablet Take 5 mg by mouth daily.    Taking  . lisinopril (ZESTRIL) 20 MG  tablet Take 1 tablet by mouth daily.    at unknown  . pramipexole (MIRAPEX) 0.75 MG tablet Take 0.75 mg by mouth 3 (three) times daily.   Taking  . pravastatin (PRAVACHOL) 40 MG tablet Take 1 tablet by mouth daily.   Taking  . pregabalin (LYRICA) 100 MG capsule Take 100 mg by mouth 3 (three) times daily.    Taking  . selegiline (ELDEPRYL) 5 MG capsule Take 5 mg by mouth 2 (two) times daily.    Taking  . Vitamin D, Ergocalciferol, (DRISDOL) 50000 UNITS CAPS capsule Take 1 capsule by mouth once a week.   Taking  . tiZANidine (ZANAFLEX) 4 MG tablet 1/2 or 1 Tablet by mout Up To 4 Times Per Day for Muscle Spasms. May Cause Drowsiness  Taking   Scheduled: . aspirin  300 mg Rectal Daily  . aspirin  325 mg Oral Daily  . carbidopa-levodopa  1 tablet Oral TID  . chlorhexidine gluconate (MEDLINE KIT)  15 mL Mouth Rinse BID  . Chlorhexidine Gluconate Cloth  6 each Topical Q0600  . cyanocobalamin  1,000 mcg Intramuscular Daily  . insulin aspart  0-9 Units Subcutaneous Q4H  . levothyroxine  50 mcg Intravenous Daily  . mouth rinse  15 mL Mouth Rinse 10 times per day   Continuous: . sodium chloride 100 mL/hr at 06/21/18 0400  . cefTRIAXone (ROCEPHIN)  IV Stopped (06/20/18 1338)  . famotidine (PEPCID) IV Stopped (06/20/18 2246)  . levETIRAcetam Stopped (06/20/18 2226)   HUD:[id number], albuterol, fentaNYL (SUBLIMAZE) injection, fentaNYL (SUBLIMAZE) injection, ondansetron (ZOFRAN) IV  Assesment: She has acute encephalopathy.  She had been a little sleepy on the day prior to the admission when her family checked on her.  The next day she was unresponsive and she had been down for an unknown period of time.  She has remained unresponsive although she is on some medications now.  She was intubated for airway protection and has remained on the ventilator since.  Her endotracheal tube was almost in the right main bronchus yesterday morning and the endotracheal tube was pulled back with good position.   There was concern that she had possible pneumothorax so repeat chest x-ray was done which did not show a pneumothorax  She had rhabdomyolysis and her CPK is improving.  She had elevated troponin likely multifactorial no overt evidence of acute coronary syndrome  She has Parkinson's disease at baseline and has not been able to get her medication because of the lack of orogastric or nasogastric tube. Active Problems:   Lower urinary tract infectious disease   Unresponsiveness   Acute encephalopathy   Acute respiratory failure with hypoxia (HCC)   Rhabdomyolysis    Plan: Continue supportive care.  It may be worth seeing if GI can help with placing an OG tube so we can give her the medicines for Parkinson's.    LOS: 2 days   Jennifer [name] 06/21/2018, 8:49 AM

## 2018-06-21 NOTE — Progress Notes (Addendum)
PROGRESS NOTE  Jennifer [name] HEN:[id number] DOB: 10/25/1930 DOA: 07/06/2018 PCP: [name], MD  Brief History:  85 year old female with a history of TIA, Parkinson's disease, hypothyroidism, hypertension, diabetes mellitus, hyperlipidemia, PSVT status post RFA 2006, and tobacco abuse in remission presented with unresponsiveness.  The patient lives by herself, but her granddaughter intermittently checks up on her.  On 06/18/2018, the patient granddaughter noted the patient to be lethargic, but felt that the patient was just tired and sleepy.  She went back to check up on the patient on the morning of 06/18/2018 and noted the patient to be essentially unresponsive.  EMS was activated.  In the emergency department, the patient was intubated to protect her airway secondary to her encephalopathy.  According to the patient's daughter, the patient has had a long history of poor compliance with her medications.  It was felt in the past that her encephalopathy may have been partly attributed to not taking her Parkinson's medicines as well as he thyroid replacement.  However, the patient has never been unresponsive.  There is been no history of recent fevers, chills, complaints of chest pain, shortness breath, vomiting, diarrhea.  At baseline, the patient has some pleasant confusion but is able to converse.  She normally gets around in a wheelchair, but occasionally uses a walker. In the emergency department, the patient had temperature 100.4 F, but blood pressure as high as 208/73.  EKG showed sinus tachycardia without any ST-T wave changes.  CPK was 1065.  UA showed 21-50 WBC.  Otherwise BMP and CBC were essentially unremarkable.  CT of the brain was negative for any acute findings.  Chest x-ray was negative for any infiltrates.  Neurology was consulted to assist with management.  Assessment/Plan: Acute encephalopathy, type unspecified -Etiology unclear although likely multifactorial including UTI,  B12 deficiency, possible seizure, levodopa withdraw -Appreciate neurology consult -07/12/2018 CT brain negative for acute findings -MRI of brain--cannot obtain while on ventilator -06/20/18 CT brain--neg for acute findings -Folic TIRW--4.3 -[id number]--76 -TSH 0.462 -Urine drug screen negative -Personally reviewed EKG--sinus rhythm, no ST-T wave changes -06/20/2018--patient remains minimally responsive to protopathic stimuli despite being off sedation -Obtain EEG--global slowing and triphasics consistent with metabolic encephalopathy -continue empiric keppra  UTI--EColi -Concerning for UTI -Continue ceftriaxone  B12 deficiency -Repleting IM daily  Parkinson's Disease -having difficulty obtaining NG/OG per nursing despite multiple attempts -discussed with IR--not able to place until next week -I have asked pharmacy to try to obtain SL carbidopa/levodopa (Parcopa) or transdermal dopamine agonist (Neupro/rotigotine) -I also asked GI to see if they can assist  Acute respiratory failure with hypoxia -Secondary to hypoventilation from encephalopathy -Currently on mechanical ventilation -personally reviewed CXR--no edema or infiltrates  Nontraumatic rhabdomyolysis -Initial CPK 1065>>>575 -Judicious IV fluids  Elevated troponin -Secondary to demand ischemia -Trend is flat -Echocardiogram--pending  FEN -NG/OG difficulties as noted above -place PICC for TPN -change to D51/2NS  Diabetes mellitus type 2 -Holding glipizide and Tradjenta -NovoLog sliding scale -Hemoglobin A1c--6.2  Parkinson's disease -Restart Sinemet/pramipexole/selegiline if able to obtain NG/OG access -Multiple repeated attempts on 07/12/2018 at bedside without success  Hypothyroidism -Continue IV Synthroid  Hyperlipidemia -Restart statin once able to tolerate   The patient is critically ill with multiple organ systems failure and requires high complexity decision making for assessment and support,  frequent evaluation and titration of therapies, application of advanced monitoring technologies and extensive interpretation of multiple databases.  Critical care time - 45 mins.  Disposition Plan:  Remain in ICU Family Communication:   Daughter updated on phone  Consultants:  pulmonary  Code Status:  FULL  DVT Prophylaxis:  [place] Lovenox   Procedures: As Listed in Progress Note Above  Antibiotics: Ceftriaxone 5/7>>    Subjective: Pt remains on ventilator.  No reports of vomiting or diarrhea.  No respiratory distress. Occasionally postures to protopathic stimuli.  ROS unobtainable due to being on ventilator  Objective: Vitals:   06/21/18 0900 06/21/18 1000 06/21/18 1100 06/21/18 1107  BP: (!) 165/55 (!) 171/70 (!) 168/60   Pulse: (!) 110 (!) 104 (!) 102 (!) 102  Resp: 19 (!) 21 20 (!) 43  Temp: 99 F (37.2 C) 99 F (37.2 C) 99 F (37.2 C) 99 F (37.2 C)  TempSrc:      SpO2: 100% 100% 100% 100%  Weight:      Height:        Intake/Output Summary (Last 24 hours) at 06/21/2018 1312 Last data filed at 06/21/2018 0859 Gross per 24 hour  Intake 2124.02 ml  Output 450 ml  Net 1674.02 ml   Weight change: 1 kg Exam:   General:  Pt is alert, follows commands appropriately, not in acute distress  HEENT: No icterus, No thrush, No neck mass, [place]/AT  Cardiovascular: RRR, S1/S2, no rubs, no gallops  Respiratory: fine bibasilar crackles, no wheeze  Abdomen: Soft/+BS, non tender, non distended, no guarding  Extremities: No edema, No lymphangitis, No petechiae, No rashes, no synovitis   Data Reviewed: I have personally reviewed following labs and imaging studies Basic Metabolic Panel: Recent Labs  Lab 07/10/2018 1306 06/21/2018 1719 06/20/18 0344 06/21/18 0451  NA 140  --  144 147*  K 4.0  --  4.1 3.8  CL 106  --  113* 116*  CO2 18*  --  16* 15*  GLUCOSE 109*  --  140* 135*  BUN 30*  --  30* 26*  CREATININE 1.16*  --  1.21* 1.00  CALCIUM 9.6  --  8.7*  8.8*  MG  --  2.3  --   --    Liver Function Tests: Recent Labs  Lab 06/24/2018 1306  AST 43*  ALT 22  ALKPHOS 83  BILITOT 0.9  PROT 7.1  ALBUMIN 3.8   No results for input(s): LIPASE, AMYLASE in the last 168 hours. Recent Labs  Lab 06/20/18 0855  AMMONIA 12   Coagulation Profile: Recent Labs  Lab 07/06/2018 1306  INR 1.0   CBC: Recent Labs  Lab 07/11/2018 1306 06/28/2018 1745 06/20/18 0344 06/21/18 0451  WBC 7.6  --  9.7 8.5  NEUTROABS 6.3  --   --   --   HGB 14.3  --  10.8* 10.1*  HCT 45.7 39.6 34.0* 33.4*  MCV 93.8  --  94.4 96.3  PLT 195  --  177 159   Cardiac Enzymes: Recent Labs  Lab 06/13/2018 1306 06/18/2018 1551 06/20/2018 2104 06/20/18 0344 06/21/18 0451  CKTOTAL 1,065*  --   --  721* 575*  TROPONINI  --  0.05* 0.06* 0.06*  --    BNP: Invalid input(s): POCBNP CBG: Recent Labs  Lab 06/20/18 2005 06/20/18 2346 06/21/18 0443 06/21/18 0731 06/21/18 1106  GLUCAP 108* 117* 127* 105* 120*   HbA1C: Recent Labs    07/13/2018 1306 06/20/18 0854  HGBA1C 6.2* 6.3*   Urine analysis:    Component Value Date/Time   COLORURINE AMBER (A) 06/22/2018 1213   APPEARANCEUR CLOUDY (A) 07/01/2018 1213  LABSPEC 1.023 06/18/2018 1213   PHURINE 5.0 06/23/2018 1213   GLUCOSEU NEGATIVE 06/25/2018 1213   HGBUR MODERATE (A) 06/29/2018 1213   BILIRUBINUR NEGATIVE 06/14/2018 1213   KETONESUR 20 (A) 07/04/2018 1213   PROTEINUR 30 (A) 06/23/2018 1213   UROBILINOGEN 0.2 11/20/2012 1254   NITRITE POSITIVE (A) 06/23/2018 1213   LEUKOCYTESUR MODERATE (A) 07/07/2018 1213   Sepsis Labs: '@LABRCNTIP'$ (procalcitonin:4,lacticidven:4) ) Recent Results (from the past 240 hour(s))  SARS Coronavirus 2 (CEPHEID - Performed in [place] hospital lab), Hosp Order     Status: None   Collection Time: 07/08/2018 12:15 PM  Result Value Ref Range Status   SARS Coronavirus 2 NEGATIVE NEGATIVE Final    Comment: (NOTE) If result is NEGATIVE SARS-CoV-2 target nucleic acids are NOT  DETECTED. The SARS-CoV-2 RNA is generally detectable in upper and lower  respiratory specimens during the acute phase of infection. The lowest  concentration of SARS-CoV-2 viral copies this assay can detect is 250  copies / mL. A negative result does not preclude SARS-CoV-2 infection  and should not be used as the sole basis for treatment or other  patient management decisions.  A negative result may occur with  improper specimen collection / handling, submission of specimen other  than nasopharyngeal swab, presence of viral mutation(s) within the  areas targeted by this assay, and inadequate number of viral copies  (<250 copies / mL). A negative result must be combined with clinical  observations, patient history, and epidemiological information. If result is POSITIVE SARS-CoV-2 target nucleic acids are DETECTED. The SARS-CoV-2 RNA is generally detectable in upper and lower  respiratory specimens dur ing the acute phase of infection.  Positive  results are indicative of active infection with SARS-CoV-2.  Clinical  correlation with patient history and other diagnostic information is  necessary to determine patient infection status.  Positive results do  not rule out bacterial infection or co-infection with other viruses. If result is PRESUMPTIVE POSTIVE SARS-CoV-2 nucleic acids MAY BE PRESENT.   A presumptive positive result was obtained on the submitted specimen  and confirmed on repeat testing.  While 2019 novel coronavirus  (SARS-CoV-2) nucleic acids may be present in the submitted sample  additional confirmatory testing may be necessary for epidemiological  and / or clinical management purposes  to differentiate between  SARS-CoV-2 and other Sarbecovirus currently known to infect humans.  If clinically indicated additional testing with an alternate test  methodology [phone]) is advised. The SARS-CoV-2 RNA is generally  detectable in upper and lower respiratory sp ecimens during  the acute  phase of infection. The expected result is Negative. Fact Sheet for Patients:  StrictlyIdeas.no Fact Sheet for Healthcare Providers: BankingDealers.co.za This test is not yet approved or cleared by the [place] FDA and has been authorized for detection and/or diagnosis of SARS-CoV-2 by FDA under an Emergency Use Authorization (EUA).  This EUA will remain in effect (meaning this test can be used) for the duration of the COVID-19 declaration under Section 564(b)(1) of the Act, 21 U.S.C. section 360bbb-3(b)(1), unless the authorization is terminated or revoked sooner. Performed at [facility], [address], [place], [place] [address]   Urine culture     Status: Abnormal   Collection Time: 07/09/2018  2:38 PM  Result Value Ref Range Status   Specimen Description   Final    URINE, CLEAN CATCH Performed at [facility], [address] [place], [place] [address]    Special Requests   Final    NONE  Performed at [facility], [address] [place], [place] [address]    Culture 90,000 COLONIES/mL ESCHERICHIA COLI (A)  Final   Report Status 06/21/2018 FINAL  Final   Organism ID, Bacteria ESCHERICHIA COLI (A)  Final      Susceptibility   Escherichia coli - MIC*    AMPICILLIN 4 SENSITIVE Sensitive     CEFAZOLIN <=4 SENSITIVE Sensitive     CEFTRIAXONE <=1 SENSITIVE Sensitive     CIPROFLOXACIN >=4 RESISTANT Resistant     GENTAMICIN <=1 SENSITIVE Sensitive     IMIPENEM <=0.25 SENSITIVE Sensitive     NITROFURANTOIN <=16 SENSITIVE Sensitive     TRIMETH/SULFA <=20 SENSITIVE Sensitive     AMPICILLIN/SULBACTAM <=2 SENSITIVE Sensitive     PIP/TAZO <=4 SENSITIVE Sensitive     Extended ESBL NEGATIVE Sensitive     * 90,000 COLONIES/mL ESCHERICHIA COLI  MRSA PCR Screening     Status: None   Collection Time: 06/14/2018  7:13 PM  Result Value Ref Range Status   MRSA by PCR NEGATIVE NEGATIVE Final    Comment:        The GeneXpert MRSA  Assay (FDA approved for NASAL specimens only), is one component of a comprehensive MRSA colonization surveillance program. It is not intended to diagnose MRSA infection nor to guide or monitor treatment for MRSA infections. Performed at [facility], [address] [place], [place] [address]   Culture, blood (routine x 2)     Status: None (Preliminary result)   Collection Time: 06/23/2018  9:04 PM  Result Value Ref Range Status   Specimen Description BLOOD LEFT HAND  Final   Special Requests   Final    BOTTLES DRAWN AEROBIC AND ANAEROBIC Blood Culture adequate volume   Culture   Final    NO GROWTH 2 DAYS Performed at [facility], [address], [place], [place] [address]    Report Status PENDING  Incomplete  Culture, blood (routine x 2)     Status: None (Preliminary result)   Collection Time: 06/18/2018  9:05 PM  Result Value Ref Range Status   Specimen Description BLOOD RIGHT HAND  Final   Special Requests   Final    BOTTLES DRAWN AEROBIC AND ANAEROBIC Blood Culture adequate volume   Culture   Final    NO GROWTH 2 DAYS Performed at [facility], [address] [place], [place] [address]    Report Status PENDING  Incomplete     Scheduled Meds:  aspirin  300 mg Rectal Daily   carbidopa-levodopa  1 tablet Oral TID   chlorhexidine gluconate (MEDLINE KIT)  15 mL Mouth Rinse BID   Chlorhexidine Gluconate Cloth  6 each Topical Q0600   cyanocobalamin  1,000 mcg Intramuscular Daily   insulin aspart  0-9 Units Subcutaneous Q4H   levothyroxine  50 mcg Intravenous Daily   mouth rinse  15 mL Mouth Rinse 10 times per day   Continuous Infusions:  cefTRIAXone (ROCEPHIN)  IV Stopped (06/20/18 1338)   dextrose 5 % and 0.45 % NaCl with KCl 20 mEq/L     [START ON 06/22/2018] famotidine (PEPCID) IV     levETIRAcetam 500 mg (06/21/18 0859)    Procedures/Studies: Ct Head Wo Contrast  Result Date: 06/20/2018 CLINICAL DATA:  85 year old with altered mental status. EXAM: CT HEAD  WITHOUT CONTRAST TECHNIQUE: Contiguous axial images were obtained from the base of the skull through the vertex without intravenous contrast. COMPARISON:  07/04/2018 FINDINGS: Brain: Stable cerebral atrophy. Negative for acute hemorrhage, mass lesion,  midline shift, hydrocephalus or large infarct. Again noted are hypodensities in the white matter suggesting chronic changes. Focal low-density in the right frontal-parietal white matter is compatible with old ischemic changes. Patient's head rotated on this examination. Vascular: No hyperdense vessel or unexpected calcification. Skull: Normal. Negative for fracture or focal lesion. Sinuses/Orbits: No acute finding. Other: None. IMPRESSION: 1. No acute intracranial abnormality. 2. Stable atrophy and chronic white matter changes. Findings are most compatible with chronic small vessel ischemic changes. Electronically Signed   By: Markus Daft M.D.   On: 06/20/2018 15:55   Ct Head Wo Contrast  Result Date: 06/24/2018 CLINICAL DATA:  85 year old female with a history of altered mental status EXAM: CT HEAD WITHOUT CONTRAST TECHNIQUE: Contiguous axial images were obtained from the base of the skull through the vertex without intravenous contrast. COMPARISON:  12/15/2012 FINDINGS: Brain: No acute intracranial hemorrhage. No midline shift or mass effect. Gray-white differentiation maintained. Progression of senescent volume loss. Patchy hypodensities in the periventricular white matter. Unremarkable appearance of the ventricular system. Vascular: Dense calcifications of anterior and posterior circulation. Skull: No acute fracture.  No aggressive bone lesion identified. Sinuses/Orbits: Unremarkable appearance of the orbits. Mastoid air cells clear. No middle ear effusion. No significant sinus disease. Other: Endotracheal tube in position.  Fluid in the nasopharynx IMPRESSION: Negative for acute intracranial abnormality. Senescent volume loss and evidence of chronic  microvascular ischemic disease. Electronically Signed   By: Corrie Mckusick D.O.   On: 07/12/2018 14:11   Dg Chest Port 1 View  Result Date: 06/21/2018 CLINICAL DATA:  Respiratory failure. EXAM: PORTABLE CHEST 1 VIEW COMPARISON:  Radiograph of Jun 20, 2018. FINDINGS: The heart size and mediastinal contours are within normal limits. Endotracheal tube is unchanged in position. Nasogastric tube appears to be looped within the distal esophagus with tip directed back up into the more proximal esophagus. No acute pulmonary disease is noted. No pneumothorax or pleural effusion is noted. Atherosclerosis of thoracic aorta is noted. The visualized skeletal structures are unremarkable. IMPRESSION: Endotracheal tube is unchanged in position. Nasogastric tube is looped within distal esophagus, with distal tip directed into the more proximal esophagus. No acute cardiopulmonary abnormality seen. Aortic Atherosclerosis (ICD10-I70.0). Electronically Signed   By: Marijo Conception M.D.   On: 06/21/2018 08:48   Dg Chest Port 1 View  Result Date: 06/20/2018 CLINICAL DATA:  85 year old female with repositioning of the endotracheal tube EXAM: PORTABLE CHEST 1 VIEW COMPARISON:  06/20/2018 FINDINGS: Cardiomediastinal silhouette unchanged in size and contour. No evidence of central vascular congestion. The endotracheal tube has been slightly withdrawn, now terminating 2.7 cm above the carina. There is a linear contour of the right lung of uncertain significance. There are overlying surgical lines tubes in EKG leads of the right chest somewhat obscuring evaluation. Low lung volumes. Pleuroparenchymal opacity of the right lung, may represent chronic changes and some pleural fluid. No new airspace disease. IMPRESSION: Endotracheal tube has been slightly withdrawn now terminating 2.7 cm above the carina. There is linear contour of the right lung extending in a convex configuration towards the costophrenic angle. While this is favored to  represent artifact, a right pneumothorax cannot be excluded. Correlation with physical exam may be useful, as well as a repeat chest x-ray with adequate positioning and removal of the obscuring surgical line/hardware and EKG leads. These results were discussed by telephone at the time of interpretation on 06/20/2018 at 9:21 am with the nurse caring for the patient, Ms Sherry Ruffing. Electronically Signed   By:  Corrie Mckusick D.O.   On: 06/20/2018 09:21   Dg Chest Port 1 View  Result Date: 06/20/2018 CLINICAL DATA:  Unresponsive. EXAM: PORTABLE CHEST 1 VIEW COMPARISON:  Radiograph Jun 19, 2018. FINDINGS: The heart size and mediastinal contours are within normal limits. Atherosclerosis of thoracic aorta is noted. Endotracheal tube is directed into right mainstem bronchus; withdrawal by 3-4 cm is recommended. Both lungs are clear. The visualized skeletal structures are unremarkable. IMPRESSION: Endotracheal tube directed into right mainstem bronchus; withdrawal by 3-4 cm is recommended. No acute cardiopulmonary abnormality seen. These results will be called to the ordering clinician or representative by the Radiologist Assistant, and communication documented in the PACS or zVision Dashboard. Electronically Signed   By: Marijo Conception M.D.   On: 06/20/2018 08:16   Dg Chest Portable 1 View  Result Date: 06/18/2018 CLINICAL DATA:  Unresponsive. EXAM: PORTABLE CHEST 1 VIEW COMPARISON:  06/27/2010 FINDINGS: 1301 hours. Endotracheal tube tip is approximately 1.4 cm above the base of the carina. The lungs are clear without focal pneumonia, edema, pneumothorax or pleural effusion. The cardiopericardial silhouette is within normal limits for size. The visualized bony structures of the thorax are intact. Telemetry leads overlie the chest. IMPRESSION: No active disease. Electronically Signed   By: Misty Stanley M.D.   On: 06/14/2018 13:23   Dg Chest Port 1v Same Day  Result Date: 06/20/2018 CLINICAL DATA:  Intubated  patient. Possible pneumothorax on plain film of the chest earlier today. EXAM: PORTABLE CHEST 1 VIEW COMPARISON:  Single-view of the chest earlier today. FINDINGS: Endotracheal tube is in place with the tip well above the carina. There is no pneumothorax. Finding described on the prior report was likely a skin fold. Lungs clear. Heart size normal. IMPRESSION: Negative for pneumothorax.  No acute disease. ETT in good position. Electronically Signed   By: Inge Rise M.D.   On: 06/20/2018 10:16    Orson Eva, DO  [facility] Pager [phone]  If 7PM-7AM, please contact night-coverage [email or web address] Password [email or web address] 06/21/2018, 1:12 PM   LOS: 2 days

## 2018-06-21 NOTE — Progress Notes (Signed)
Spoke with Rn re PICC order, Has already notified [facility].

## 2018-06-21 NOTE — Progress Notes (Signed)
*  PRELIMINARY RESULTS* Echocardiogram 2D Echocardiogram LIMITED has been performed.  Jennifer [name] 06/21/2018, 9:21 AM

## 2018-06-21 NOTE — Progress Notes (Signed)
PHARMACY - ADULT TOTAL PARENTERAL NUTRITION CONSULT NOTE   Pharmacy Consult for TPN Indication: inability to place NG/OG  Patient Measurements: Height: 5\' 3"  (160 cm) Weight: 135 lb 5.8 oz (61.4 kg) IBW/kg (Calculated) : 52.4   Body mass index is 23.98 kg/m. Usual Weight: 61 kg  Assessment:   Pharmacy consulted to dose TPN in patient with inability to place NG/OG tube. TPN would not be able to be started until 5/10 since past cut-off time for making today.  Recommendation to use D10W until TPN is available.  Salvatore Decent Ewelina Naves 06/21/2018,2:55 PM

## 2018-06-21 NOTE — Progress Notes (Signed)
Patient saturation 98, f 20 VT 442, VE 8.8 Lungs clear after etsx -- No problems

## 2018-06-21 NOTE — Progress Notes (Signed)
Discussed NG/OG with Dr. Elliot Cousin reviewed, results noted. -will attempt medications only, primarily patient's parkinson's meds via OG -patient to elevate to 45% in bed prior to giving meds -flush meds with saline -keep pt at 45% x 1 hour after meds -still asked pharmacy to still search for non-po forms of levodopa (Parcopa) and dopamine agonist (Neupro) given tentative situation -family updated  DTat

## 2018-06-21 NOTE — Progress Notes (Addendum)
OG tube in mid esophagus on most recent plain films. As discussed with Dr. Arbutus Leas, I recommend we give her her critical p.o. meds via OG tube with adequate flushes.  Keep patient upright at 45 degrees for medication administration. At the bedside, I have advanced OG tube another 7 cm and slightly flexed it forward securing it at this level.  Hopefully, will migrate distally. Might consider another plain film in 24 to 48 hours.  Once it reaches the stomach, could utilize for enteral feedings.

## 2018-06-21 NOTE — Progress Notes (Signed)
Notified RT due to apneic episodes.

## 2018-06-22 ENCOUNTER — Inpatient Hospital Stay (HOSPITAL_COMMUNITY): Payer: Medicare Other

## 2018-06-22 LAB — COMPREHENSIVE METABOLIC PANEL
ALT: 5 U/L (ref 0–44)
AST: 25 U/L (ref 15–41)
Albumin: 2.5 g/dL — ABNORMAL LOW (ref 3.5–5.0)
Alkaline Phosphatase: 57 U/L (ref 38–126)
Anion gap: 8 (ref 5–15)
BUN: 18 mg/dL (ref 8–23)
CO2: 21 mmol/L — ABNORMAL LOW (ref 22–32)
Calcium: 9.1 mg/dL (ref 8.9–10.3)
Chloride: 116 mmol/L — ABNORMAL HIGH (ref 98–111)
Creatinine, Ser: 0.65 mg/dL (ref 0.44–1.00)
GFR calc Af Amer: >60 mL/min (ref >60)
GFR calc non Af Amer: >60 mL/min (ref >60)
Glucose, Bld: 170 mg/dL — ABNORMAL HIGH (ref 70–99)
Potassium: 3.4 mmol/L — ABNORMAL LOW (ref 3.5–5.1)
Sodium: 145 mmol/L (ref 135–145)
Total Bilirubin: 0.7 mg/dL (ref 0.3–1.2)
Total Protein: 5.4 g/dL — ABNORMAL LOW (ref 6.5–8.1)

## 2018-06-22 LAB — BLOOD GAS, ARTERIAL
Acid-base deficit: 4.6 mmol/L — ABNORMAL HIGH (ref 0.0–2.0)
Bicarbonate: 20.9 mmol/L (ref 20.0–28.0)
FIO2: 30
MECHVT: 420 mL
O2 Saturation: 93.6 %
PEEP: 5 cmH2O
Patient temperature: 37
RATE: 16 resp/min
pCO2 arterial: 31.7 mmHg — ABNORMAL LOW (ref 32.0–48.0)
pH, Arterial: 7.402 (ref 7.350–7.450)
pO2, Arterial: 69.6 mmHg — ABNORMAL LOW (ref 83.0–108.0)

## 2018-06-22 LAB — GLUCOSE, CAPILLARY
Glucose-Capillary: 148 mg/dL — ABNORMAL HIGH (ref 70–99)
Glucose-Capillary: 149 mg/dL — ABNORMAL HIGH (ref 70–99)
Glucose-Capillary: 164 mg/dL — ABNORMAL HIGH (ref 70–99)
Glucose-Capillary: 171 mg/dL — ABNORMAL HIGH (ref 70–99)
Glucose-Capillary: 172 mg/dL — ABNORMAL HIGH (ref 70–99)
Glucose-Capillary: 183 mg/dL — ABNORMAL HIGH (ref 70–99)

## 2018-06-22 LAB — CBC WITH DIFFERENTIAL/PLATELET
Abs Immature Granulocytes: 0.04 10*3/uL (ref 0.00–0.07)
Basophils Absolute: 0 10*3/uL (ref 0.0–0.1)
Basophils Relative: 1 %
Eosinophils Absolute: 0.1 10*3/uL (ref 0.0–0.5)
Eosinophils Relative: 2 %
HCT: 30.3 % — ABNORMAL LOW (ref 36.0–46.0)
Hemoglobin: 9.7 g/dL — ABNORMAL LOW (ref 12.0–15.0)
Immature Granulocytes: 1 %
Lymphocytes Relative: 8 %
Lymphs Abs: 0.6 10*3/uL — ABNORMAL LOW (ref 0.7–4.0)
MCH: 29.8 pg (ref 26.0–34.0)
MCHC: 32 g/dL (ref 30.0–36.0)
MCV: 92.9 fL (ref 80.0–100.0)
Monocytes Absolute: 0.6 10*3/uL (ref 0.1–1.0)
Monocytes Relative: 9 %
Neutro Abs: 5.3 10*3/uL (ref 1.7–7.7)
Neutrophils Relative %: 79 %
Platelets: 139 10*3/uL — ABNORMAL LOW (ref 150–400)
RBC: 3.26 MIL/uL — ABNORMAL LOW (ref 3.87–5.11)
RDW: 14.3 % (ref 11.5–15.5)
WBC: 6.6 10*3/uL (ref 4.0–10.5)
nRBC: 0 % (ref 0.0–0.2)

## 2018-06-22 LAB — PREALBUMIN: Prealbumin: 6.4 mg/dL — ABNORMAL LOW (ref 18–38)

## 2018-06-22 LAB — MAGNESIUM: Magnesium: 1.8 mg/dL (ref 1.7–2.4)

## 2018-06-22 LAB — CK: Total CK: 433 U/L — ABNORMAL HIGH (ref 38–234)

## 2018-06-22 LAB — PHOSPHORUS: Phosphorus: 2 mg/dL — ABNORMAL LOW (ref 2.5–4.6)

## 2018-06-22 MED ORDER — POTASSIUM PHOSPHATES 15 MMOLE/5ML IV SOLN
20.0000 mmol | Freq: Once | INTRAVENOUS | Status: AC
  Administered 2018-06-22: 20 mmol via INTRAVENOUS
  Filled 2018-06-22: qty 6.67

## 2018-06-22 NOTE — Progress Notes (Signed)
Subjective: She remains unresponsive.  Eyes are still deviated to the right.  She has been able to breathe on her own but has had some apnea.  Objective: Vital signs in last 24 hours: Temp:  [98.6 F (37 C)-99.5 F (37.5 C)] 99 F (37.2 C) (05/10 0719) Pulse Rate:  [81-120] 81 (05/10 0600) Resp:  [9-43] 21 (05/10 0719) BP: (136-195)/(54-85) 149/71 (05/10 0700) SpO2:  [97 %-100 %] 100 % (05/10 0741) FiO2 (%):  [30 %] 30 % (05/10 0741) Weight change:     Intake/Output from previous day: 05/09 0701 - 05/10 0700 In: 1698.6 [I.V.:1178.6; IV Piggyback:400] Out: 400 [Urine:400]  PHYSICAL EXAM General appearance: Intubated on the ventilator unresponsive Resp: clear to auscultation bilaterally Cardio: regular rate and rhythm, S1, S2 normal, no murmur, click, rub or gallop GI: soft, non-tender; bowel sounds normal; no masses,  no organomegaly Extremities: She has some swelling of the extremities but not exactly pitting edema  Lab Results:  Results for orders placed or performed during the hospital encounter of 06/28/2018 (from the past 48 hour(s))  Glucose, capillary     Status: Abnormal   Collection Time: 06/20/18 11:34 AM  Result Value Ref Range   Glucose-Capillary 127 (H) 70 - 99 mg/dL   Comment 1 Notify RN    Comment 2 Document in Chart   Glucose, capillary     Status: None   Collection Time: 06/20/18  3:46 PM  Result Value Ref Range   Glucose-Capillary 94 70 - 99 mg/dL   Comment 1 Notify RN    Comment 2 Document in Chart   Glucose, capillary     Status: Abnormal   Collection Time: 06/20/18  8:05 PM  Result Value Ref Range   Glucose-Capillary 108 (H) 70 - 99 mg/dL   Comment 1 Notify RN    Comment 2 Document in Chart   Glucose, capillary     Status: Abnormal   Collection Time: 06/20/18 11:46 PM  Result Value Ref Range   Glucose-Capillary 117 (H) 70 - 99 mg/dL   Comment 1 Notify RN    Comment 2 Document in Chart   Glucose, capillary     Status: Abnormal   Collection  Time: 06/21/18  4:43 AM  Result Value Ref Range   Glucose-Capillary 127 (H) 70 - 99 mg/dL   Comment 1 Notify RN    Comment 2 Document in Chart   Basic metabolic panel     Status: Abnormal   Collection Time: 06/21/18  4:51 AM  Result Value Ref Range   Sodium 147 (H) 135 - 145 mmol/L   Potassium 3.8 3.5 - 5.1 mmol/L   Chloride 116 (H) 98 - 111 mmol/L   CO2 15 (L) 22 - 32 mmol/L   Glucose, Bld 135 (H) 70 - 99 mg/dL   BUN 26 (H) 8 - 23 mg/dL   Creatinine, Ser 1.00 0.44 - 1.00 mg/dL   Calcium 8.8 (L) 8.9 - 10.3 mg/dL   GFR calc non Af Amer 51 (L) >60 mL/min   GFR calc Af Amer 59 (L) >60 mL/min   Anion gap 16 (H) 5 - 15    Comment: Performed at [facility], [address], [place], [place] [address]  CBC     Status: Abnormal   Collection Time: 06/21/18  4:51 AM  Result Value Ref Range   WBC 8.5 4.0 - 10.5 K/uL   RBC 3.47 (L) 3.87 - 5.11 MIL/uL   Hemoglobin 10.1 (L) 12.0 - 15.0 g/dL  HCT 33.4 (L) 36.0 - 46.0 %   MCV 96.3 80.0 - 100.0 fL   MCH 29.1 26.0 - 34.0 pg   MCHC 30.2 30.0 - 36.0 g/dL   RDW 14.3 11.5 - 15.5 %   Platelets 159 150 - 400 K/uL   nRBC 0.0 0.0 - 0.2 %    Comment: Performed at [facility], [address] [place], [place] [address]  CK     Status: Abnormal   Collection Time: 06/21/18  4:51 AM  Result Value Ref Range   Total CK 575 (H) 38 - 234 U/L    Comment: Performed at [facility], [address], [place], [place] [address]  Blood gas, arterial     Status: Abnormal   Collection Time: 06/21/18  5:59 AM  Result Value Ref Range   FIO2 30.00    pH, Arterial 7.382 7.350 - 7.450   pCO2 arterial 31.2 (L) 32.0 - 48.0 mmHg   pO2, Arterial 125 (H) 83.0 - 108.0 mmHg   Bicarbonate 19.8 (L) 20.0 - 28.0 mmol/L   Acid-base deficit 6.0 (H) 0.0 - 2.0 mmol/L   O2 Saturation 98.6 %   Patient temperature 37.0    Allens test (pass/fail) PASS PASS    Comment: Performed at [facility], [address], [place], [place] [address]  Glucose, capillary     Status: Abnormal    Collection Time: 06/21/18  7:31 AM  Result Value Ref Range   Glucose-Capillary 105 (H) 70 - 99 mg/dL  Glucose, capillary     Status: Abnormal   Collection Time: 06/21/18 11:06 AM  Result Value Ref Range   Glucose-Capillary 120 (H) 70 - 99 mg/dL  Glucose, capillary     Status: Abnormal   Collection Time: 06/21/18  5:29 PM  Result Value Ref Range   Glucose-Capillary 143 (H) 70 - 99 mg/dL  Glucose, capillary     Status: Abnormal   Collection Time: 06/21/18  8:05 PM  Result Value Ref Range   Glucose-Capillary 145 (H) 70 - 99 mg/dL   Comment 1 Notify RN    Comment 2 Document in Chart   Glucose, capillary     Status: Abnormal   Collection Time: 06/22/18 12:02 AM  Result Value Ref Range   Glucose-Capillary 164 (H) 70 - 99 mg/dL  Blood gas, arterial     Status: Abnormal   Collection Time: 06/22/18  3:58 AM  Result Value Ref Range   FIO2 30.00    Delivery systems PRESSURE REGULATED VOLUME CONTROL    Mode VENTILATOR    VT 420 mL   LHR 16 resp/min   Peep/cpap 5.0 cm H20   pH, Arterial 7.402 7.350 - 7.450   pCO2 arterial 31.7 (L) 32.0 - 48.0 mmHg   pO2, Arterial 69.6 (L) 83.0 - 108.0 mmHg   Bicarbonate 20.9 20.0 - 28.0 mmol/L   Acid-base deficit 4.6 (H) 0.0 - 2.0 mmol/L   O2 Saturation 93.6 %   Patient temperature 37.0    Allens test (pass/fail) PASS PASS    Comment: Performed at [facility], [address] [place], [place] [address]  Glucose, capillary     Status: Abnormal   Collection Time: 06/22/18  4:21 AM  Result Value Ref Range   Glucose-Capillary 148 (H) 70 - 99 mg/dL   Comment 1 Notify RN    Comment 2 Document in Chart   Comprehensive metabolic panel     Status: Abnormal   Collection Time: 06/22/18  4:46 AM  Result Value Ref  Range   Sodium 145 135 - 145 mmol/L   Potassium 3.4 (L) 3.5 - 5.1 mmol/L   Chloride 116 (H) 98 - 111 mmol/L   CO2 21 (L) 22 - 32 mmol/L   Glucose, Bld 170 (H) 70 - 99 mg/dL   BUN 18 8 - 23 mg/dL   Creatinine, Ser 0.65 0.44 - 1.00 mg/dL    Calcium 9.1 8.9 - 10.3 mg/dL   Total Protein 5.4 (L) 6.5 - 8.1 g/dL   Albumin 2.5 (L) 3.5 - 5.0 g/dL   AST 25 15 - 41 U/L   ALT 5 0 - 44 U/L   Alkaline Phosphatase 57 38 - 126 U/L   Total Bilirubin 0.7 0.3 - 1.2 mg/dL   GFR calc non Af Amer >60 >60 mL/min   GFR calc Af Amer >60 >60 mL/min   Anion gap 8 5 - 15    Comment: Performed at [facility], [address], [place], [place] [address]  CBC with Differential/Platelet     Status: Abnormal   Collection Time: 06/22/18  4:46 AM  Result Value Ref Range   WBC 6.6 4.0 - 10.5 K/uL   RBC 3.26 (L) 3.87 - 5.11 MIL/uL   Hemoglobin 9.7 (L) 12.0 - 15.0 g/dL   HCT 30.3 (L) 36.0 - 46.0 %   MCV 92.9 80.0 - 100.0 fL   MCH 29.8 26.0 - 34.0 pg   MCHC 32.0 30.0 - 36.0 g/dL   RDW 14.3 11.5 - 15.5 %   Platelets 139 (L) 150 - 400 K/uL   nRBC 0.0 0.0 - 0.2 %   Neutrophils Relative % 79 %   Neutro Abs 5.3 1.7 - 7.7 K/uL   Lymphocytes Relative 8 %   Lymphs Abs 0.6 (L) 0.7 - 4.0 K/uL   Monocytes Relative 9 %   Monocytes Absolute 0.6 0.1 - 1.0 K/uL   Eosinophils Relative 2 %   Eosinophils Absolute 0.1 0.0 - 0.5 K/uL   Basophils Relative 1 %   Basophils Absolute 0.0 0.0 - 0.1 K/uL   Immature Granulocytes 1 %   Abs Immature Granulocytes 0.04 0.00 - 0.07 K/uL    Comment: Performed at [facility], [address], [place], [place] [address]  CK     Status: Abnormal   Collection Time: 06/22/18  4:46 AM  Result Value Ref Range   Total CK 433 (H) 38 - 234 U/L    Comment: Performed at [facility], [address] [place], [place] [address]  Magnesium     Status: None   Collection Time: 06/22/18  4:46 AM  Result Value Ref Range   Magnesium 1.8 1.7 - 2.4 mg/dL    Comment: Performed at [facility], [address], [place], [place] [address]  Phosphorus     Status: Abnormal   Collection Time: 06/22/18  4:46 AM  Result Value Ref Range   Phosphorus 2.0 (L) 2.5 - 4.6 mg/dL    Comment: Performed at [facility], [address] [place], [place] [address]   Glucose, capillary     Status: Abnormal   Collection Time: 06/22/18  7:18 AM  Result Value Ref Range   Glucose-Capillary 149 (H) 70 - 99 mg/dL    ABGS Recent Labs    06/22/18 0358  PHART 7.402  PO2ART 69.6*  HCO3 20.9   CULTURES Recent Results (from the past 240 hour(s))  SARS Coronavirus 2 (CEPHEID - Performed in [facility] hospital lab), Hosp Order     Status: None  Collection Time: 06/18/2018 12:15 PM  Result Value Ref Range Status   SARS Coronavirus 2 NEGATIVE NEGATIVE Final    Comment: (NOTE) If result is NEGATIVE SARS-CoV-2 target nucleic acids are NOT DETECTED. The SARS-CoV-2 RNA is generally detectable in upper and lower  respiratory specimens during the acute phase of infection. The lowest  concentration of SARS-CoV-2 viral copies this assay can detect is 250  copies / mL. A negative result does not preclude SARS-CoV-2 infection  and should not be used as the sole basis for treatment or other  patient management decisions.  A negative result may occur with  improper specimen collection / handling, submission of specimen other  than nasopharyngeal swab, presence of viral mutation(s) within the  areas targeted by this assay, and inadequate number of viral copies  (<250 copies / mL). A negative result must be combined with clinical  observations, patient history, and epidemiological information. If result is POSITIVE SARS-CoV-2 target nucleic acids are DETECTED. The SARS-CoV-2 RNA is generally detectable in upper and lower  respiratory specimens dur ing the acute phase of infection.  Positive  results are indicative of active infection with SARS-CoV-2.  Clinical  correlation with patient history and other diagnostic information is  necessary to determine patient infection status.  Positive results do  not rule out bacterial infection or co-infection with other viruses. If result is PRESUMPTIVE POSTIVE SARS-CoV-2 nucleic acids MAY BE PRESENT.   A presumptive  positive result was obtained on the submitted specimen  and confirmed on repeat testing.  While 2019 novel coronavirus  (SARS-CoV-2) nucleic acids may be present in the submitted sample  additional confirmatory testing may be necessary for epidemiological  and / or clinical management purposes  to differentiate between  SARS-CoV-2 and other Sarbecovirus currently known to infect humans.  If clinically indicated additional testing with an alternate test  methodology [id number]) is advised. The SARS-CoV-2 RNA is generally  detectable in upper and lower respiratory sp ecimens during the acute  phase of infection. The expected result is Negative. Fact Sheet for Patients:  StrictlyIdeas.no Fact Sheet for Healthcare Providers: BankingDealers.co.za This test is not yet approved or cleared by the [place] FDA and has been authorized for detection and/or diagnosis of SARS-CoV-2 by FDA under an Emergency Use Authorization (EUA).  This EUA will remain in effect (meaning this test can be used) for the duration of the COVID-19 declaration under Section 564(b)(1) of the Act, 21 U.S.C. section 360bbb-3(b)(1), unless the authorization is terminated or revoked sooner. Performed at [facility], [address] [place], [place] [address]   Urine culture     Status: Abnormal   Collection Time: 06/28/2018  2:38 PM  Result Value Ref Range Status   Specimen Description   Final    URINE, CLEAN CATCH Performed at [facility], [address], [place], [place] [address]    Special Requests   Final    NONE Performed at [facility], [address] [place], [place] [address]    Culture 90,000 COLONIES/mL ESCHERICHIA COLI (A)  Final   Report Status 06/21/2018 FINAL  Final   Organism ID, Bacteria ESCHERICHIA COLI (A)  Final      Susceptibility   Escherichia coli - MIC*    AMPICILLIN 4 SENSITIVE Sensitive     CEFAZOLIN <=4 SENSITIVE Sensitive     CEFTRIAXONE  <=1 SENSITIVE Sensitive     CIPROFLOXACIN >=4 RESISTANT Resistant     GENTAMICIN <=1 SENSITIVE Sensitive     IMIPENEM <=0.25 SENSITIVE  Sensitive     NITROFURANTOIN <=16 SENSITIVE Sensitive     TRIMETH/SULFA <=20 SENSITIVE Sensitive     AMPICILLIN/SULBACTAM <=2 SENSITIVE Sensitive     PIP/TAZO <=4 SENSITIVE Sensitive     Extended ESBL NEGATIVE Sensitive     * 90,000 COLONIES/mL ESCHERICHIA COLI  MRSA PCR Screening     Status: None   Collection Time: 06/25/2018  7:13 PM  Result Value Ref Range Status   MRSA by PCR NEGATIVE NEGATIVE Final    Comment:        The GeneXpert MRSA Assay (FDA approved for NASAL specimens only), is one component of a comprehensive MRSA colonization surveillance program. It is not intended to diagnose MRSA infection nor to guide or monitor treatment for MRSA infections. Performed at [facility], [address] [place], [place] [address]   Culture, blood (routine x 2)     Status: None (Preliminary result)   Collection Time: 07/01/2018  9:04 PM  Result Value Ref Range Status   Specimen Description BLOOD LEFT HAND  Final   Special Requests   Final    BOTTLES DRAWN AEROBIC AND ANAEROBIC Blood Culture adequate volume   Culture   Final    NO GROWTH 3 DAYS Performed at [facility], [address], [place], [place] [address]    Report Status PENDING  Incomplete  Culture, blood (routine x 2)     Status: None (Preliminary result)   Collection Time: 07/08/2018  9:05 PM  Result Value Ref Range Status   Specimen Description BLOOD RIGHT HAND  Final   Special Requests   Final    BOTTLES DRAWN AEROBIC AND ANAEROBIC Blood Culture adequate volume   Culture   Final    NO GROWTH 3 DAYS Performed at [facility], [address] [place], [place] [address]    Report Status PENDING  Incomplete   Studies/Results: Dg Abd 1 View  Result Date: 06/21/2018 CLINICAL DATA:  Check gastric catheter placement EXAM: ABDOMEN - 1 VIEW COMPARISON:  None. FINDINGS: Scattered large and  small bowel gas is noted. Degenerative changes of lumbar spine are noted with scoliosis concave to the right. Check gastric catheter is noted in the mid esophagus at the superior aspect of the film. This could be advanced several cm to reach the stomach. IMPRESSION: Gastric catheter in the mid esophagus. Electronically Signed   By: Inez Catalina M.D.   On: 06/21/2018 15:47   Ct Head Wo Contrast  Result Date: 06/20/2018 CLINICAL DATA:  85 year old with altered mental status. EXAM: CT HEAD WITHOUT CONTRAST TECHNIQUE: Contiguous axial images were obtained from the base of the skull through the vertex without intravenous contrast. COMPARISON:  06/28/2018 FINDINGS: Brain: Stable cerebral atrophy. Negative for acute hemorrhage, mass lesion, midline shift, hydrocephalus or large infarct. Again noted are hypodensities in the white matter suggesting chronic changes. Focal low-density in the right frontal-parietal white matter is compatible with old ischemic changes. Patient's head rotated on this examination. Vascular: No hyperdense vessel or unexpected calcification. Skull: Normal. Negative for fracture or focal lesion. Sinuses/Orbits: No acute finding. Other: None. IMPRESSION: 1. No acute intracranial abnormality. 2. Stable atrophy and chronic white matter changes. Findings are most compatible with chronic small vessel ischemic changes. Electronically Signed   By: Markus Daft M.D.   On: 06/20/2018 15:55   Dg Chest Port 1 View  Result Date: 06/22/2018 CLINICAL DATA:  Respiratory failure. EXAM: PORTABLE CHEST 1 VIEW COMPARISON:  06/21/2018 FINDINGS: The patient is rotated to the left. A right PICC  has been placed and terminates over the lower SVC. An endotracheal tube terminates just below the clavicles, unchanged. An enteric tube is suboptimally visualized though now appears to extend into the upper abdomen. The cardiomediastinal silhouette is unchanged. Lung volumes remain low without evidence of airspace  consolidation, edema, pleural effusion, pneumothorax. IMPRESSION: 1. Support devices including new right PICC as above. 2. Low lung volumes without evidence of acute airspace disease. Electronically Signed   By: Logan Bores M.D.   On: 06/22/2018 08:16   Dg Chest Port 1 View  Result Date: 06/21/2018 CLINICAL DATA:  Respiratory failure. EXAM: PORTABLE CHEST 1 VIEW COMPARISON:  Radiograph of Jun 20, 2018. FINDINGS: The heart size and mediastinal contours are within normal limits. Endotracheal tube is unchanged in position. Nasogastric tube appears to be looped within the distal esophagus with tip directed back up into the more proximal esophagus. No acute pulmonary disease is noted. No pneumothorax or pleural effusion is noted. Atherosclerosis of thoracic aorta is noted. The visualized skeletal structures are unremarkable. IMPRESSION: Endotracheal tube is unchanged in position. Nasogastric tube is looped within distal esophagus, with distal tip directed into the more proximal esophagus. No acute cardiopulmonary abnormality seen. Aortic Atherosclerosis (ICD10-I70.0). Electronically Signed   By: Marijo Conception M.D.   On: 06/21/2018 08:48   Dg Chest Port 1 View  Result Date: 06/20/2018 CLINICAL DATA:  85 year old female with repositioning of the endotracheal tube EXAM: PORTABLE CHEST 1 VIEW COMPARISON:  06/20/2018 FINDINGS: Cardiomediastinal silhouette unchanged in size and contour. No evidence of central vascular congestion. The endotracheal tube has been slightly withdrawn, now terminating 2.7 cm above the carina. There is a linear contour of the right lung of uncertain significance. There are overlying surgical lines tubes in EKG leads of the right chest somewhat obscuring evaluation. Low lung volumes. Pleuroparenchymal opacity of the right lung, may represent chronic changes and some pleural fluid. No new airspace disease. IMPRESSION: Endotracheal tube has been slightly withdrawn now terminating 2.7 cm above  the carina. There is linear contour of the right lung extending in a convex configuration towards the costophrenic angle. While this is favored to represent artifact, a right pneumothorax cannot be excluded. Correlation with physical exam may be useful, as well as a repeat chest x-ray with adequate positioning and removal of the obscuring surgical line/hardware and EKG leads. These results were discussed by telephone at the time of interpretation on 06/20/2018 at 9:21 am with the nurse caring for the patient, Ms Sherry Ruffing. Electronically Signed   By: Corrie Mckusick D.O.   On: 06/20/2018 09:21   Dg Chest Port 1v Same Day  Result Date: 06/20/2018 CLINICAL DATA:  Intubated patient. Possible pneumothorax on plain film of the chest earlier today. EXAM: PORTABLE CHEST 1 VIEW COMPARISON:  Single-view of the chest earlier today. FINDINGS: Endotracheal tube is in place with the tip well above the carina. There is no pneumothorax. Finding described on the prior report was likely a skin fold. Lungs clear. Heart size normal. IMPRESSION: Negative for pneumothorax.  No acute disease. ETT in good position. Electronically Signed   By: Inge Rise M.D.   On: 06/20/2018 10:16    Medications:  Prior to Admission:  Medications Prior to Admission  Medication Sig Dispense Refill Last Dose  . acetaminophen (TYLENOL) 650 MG CR tablet Take 650 mg by mouth 2 (two) times daily as needed for pain.   Taking  . carbidopa-levodopa (SINEMET IR) 10-100 MG tablet Take 1 Table tby mouth 3 Times Daily  As Directed for Parkinson's Disease   Taking  . clopidogrel (PLAVIX) 75 MG tablet Take 75 mg by mouth at bedtime.     at unknown  . furosemide (LASIX) 40 MG tablet 40 mg. Take 1/2tablet by mouth twice daily   Taking  . glipiZIDE (GLUCOTROL XL) 10 MG 24 hr tablet TAKE  (1)  TABLET BY MOUTH TWICE A DAY.   Taking  . levothyroxine (SYNTHROID) 100 MCG tablet Take 1 tablet by mouth daily.     Marland Kitchen linagliptin (TRADJENTA) 5 MG TABS tablet  Take 5 mg by mouth daily.    Taking  . lisinopril (ZESTRIL) 20 MG tablet Take 1 tablet by mouth daily.    at unknown  . pramipexole (MIRAPEX) 0.75 MG tablet Take 0.75 mg by mouth 3 (three) times daily.   Taking  . pravastatin (PRAVACHOL) 40 MG tablet Take 1 tablet by mouth daily.   Taking  . pregabalin (LYRICA) 100 MG capsule Take 100 mg by mouth 3 (three) times daily.    Taking  . selegiline (ELDEPRYL) 5 MG capsule Take 5 mg by mouth 2 (two) times daily.    Taking  . Vitamin D, Ergocalciferol, (DRISDOL) 50000 UNITS CAPS capsule Take 1 capsule by mouth once a week.   Taking  . tiZANidine (ZANAFLEX) 4 MG tablet 1/2 or 1 Tablet by mout Up To 4 Times Per Day for Muscle Spasms. May Cause Drowsiness   Taking   Scheduled: . aspirin  300 mg Rectal Daily  . carbidopa-levodopa  1 tablet Oral TID  . chlorhexidine gluconate (MEDLINE KIT)  15 mL Mouth Rinse BID  . Chlorhexidine Gluconate Cloth  6 each Topical Q0600  . cyanocobalamin  1,000 mcg Intramuscular Daily  . insulin aspart  0-9 Units Subcutaneous Q4H  . levothyroxine  50 mcg Intravenous Daily  . mouth rinse  15 mL Mouth Rinse 10 times per day  . pramipexole  0.75 mg Oral TID   Continuous: . cefTRIAXone (ROCEPHIN)  IV Stopped (06/21/18 1434)  . dextrose 5 % and 0.45 % NaCl with KCl 20 mEq/L 75 mL/hr at 06/22/18 0600  . famotidine (PEPCID) IV    . levETIRAcetam 500 mg (06/22/18 0732)  . potassium PHOSPHATE IVPB (in mmol)     DPO:[id number], albuterol, fentaNYL (SUBLIMAZE) injection, fentaNYL (SUBLIMAZE) injection, ondansetron (ZOFRAN) IV  Assesment: She was admitted with acute encephalopathy which appears to be multifactorial.  She had acute hypoxic respiratory failure and was intubated on arrival.  She has remained unresponsive and although she has been able to breathe on her own at this point were not going to be able to extubate her because she cannot protect her airway.  She was down for an unknown period of time and has some  rhabdomyolysis which is improving slowly  She has Parkinson's disease at baseline and we have had trouble with getting a gastric tube in place.  I have personally reviewed the chest x-ray and I think the gastric tube is about at the mid esophagus still but it is very difficult to tell Active Problems:   Lower urinary tract infectious disease   Unresponsiveness   Acute encephalopathy   Acute respiratory failure with hypoxia ([place])   Rhabdomyolysis    Plan: Continue current treatments.  Okay to let her breathe on her own.  If she does not improve will need to decide about extubation/reintubation CODE STATUS etc.    LOS: 3 days   Alonza Bogus 06/22/2018, 8:58 AM

## 2018-06-22 NOTE — Progress Notes (Signed)
Patient respiratory rate seemed high back on PRVC. Mode about  27 to 30. Increased oxygen to 40 to see if she will slow her rate. Her saturation was 99 on the the FiO2 of 30. Also made inspiratory time faster from .90 to .80. This could be a neurologic problem and not respiratory? She also did 8 hrs on CPAP/PS on day shift. Fluid are positive 5769.4

## 2018-06-22 NOTE — Progress Notes (Signed)
PROGRESS NOTE  Jennifer [name] COMES NTZ:[id number] DOB: 12-01-1930 DOA: 07/07/2018 PCP: [name], MD  Brief History: 85 year old female with a history of TIA, Parkinson's disease, hypothyroidism, hypertension, diabetes mellitus, hyperlipidemia, PSVT status post RFA 2006, and tobacco abuse in remission presented with unresponsiveness. The patient lives by herself, but her granddaughter intermittently checks up on her. On 06/18/2018, the patient granddaughter noted the patient to be lethargic, but felt that the patient was just tired and sleepy. She went back to check up on the patient on the morning of 07/13/2018 and noted the patient to be essentially unresponsive. EMS was activated. In the emergency department, the patient was intubated to protect her airway secondary to her encephalopathy. According to the patient's daughter, the patient has had a long history of poor compliance with her medications. It was felt in the past that her encephalopathy may have been partly attributed to not taking her Parkinson's medicines as well as he thyroid replacement. However, the patient has never been unresponsive. There is been no history of recent fevers, chills, complaints of chest pain, shortness breath, vomiting, diarrhea. At baseline, the patient has some pleasant confusion but is able to converse. She normally gets around in a wheelchair, but occasionally uses a walker. In the emergency department, the patient had temperature 100.4 F, but blood pressure as high as 208/73. EKG showed sinus tachycardia without any ST-T wave changes. CPK was 1065. UA showed 21-50 WBC. Otherwise BMP and CBC were essentially unremarkable. CT of the brain was negative for any acute findings. Chest x-ray was negative for any infiltrates. Neurology and pulmonary were consulted to assist with management.  Assessment/Plan: Acute encephalopathy, type unspecified -Etiology unclear although likely multifactorial  including UTI, B12 deficiency, possible seizure, levodopa withdraw -Appreciate neurology consult -07/01/2018 CT brain negative for acute findings -MRI of brain--cannot obtain while on ventilator -06/20/18 CT brain--neg for acute findings -Folic QPRF--1.6 -[id number]--93 -TSH 0.462 -Urine drug screen negative -Personally reviewed EKG--sinus rhythm, no ST-T wave changes -06/20/2018--patient remains minimally responsive toprotopathic stimuli despite being off sedation -Obtain EEG--global slowing and triphasics consistent with metabolic encephalopathy -continue empiric keppra -have asked pharmacy to try to obtain SL carbidopa/levodopa (Parcopa) or transdermal dopamine agonist (Neupro/rotigotine)--unclear how much po meds getting absorbed  UTI--EColi -Continue ceftriaxone  B12 deficiency -Repleting IM daily  Parkinson's Disease -having difficulty obtaining NG/OG per nursing despite multiple attempts -discussed with IR--not able to place until next week -I have asked pharmacy to try to obtain SL carbidopa/levodopa (Parcopa) or transdermal dopamine agonist (Neupro/rotigotine) -appreciate GI assistance-->OG placed to mid-esophagus-->trial of meds only with patient sitting at 45 degrees  Acute respiratory failure with hypoxia -Secondary to hypoventilation from encephalopathy -Currently on mechanical ventilation -personally reviewed CXR--no edema or infiltrates  Nontraumatic rhabdomyolysis -Initial CPK 1065>>>575 -Judicious IV fluids  Elevated troponin -Secondary to demand ischemia -Trend is flat -Echocardiogram--pending  FEN -NG/OG difficulties as noted above -place PICC for TPN if unable to get enteral feeding -change to D51/2NS  Diabetes mellitus type 2 -Holding glipizide and Tradjenta -NovoLog sliding scale -Hemoglobin A1c--6.2  Hypothyroidism -Continue IV Synthroid  Hyperlipidemia -Restart statin once able to tolerate   The patient is critically ill with  multiple organ systems failure and requires high complexity decision making for assessment and support, frequent evaluation and titration of therapies, application of advanced monitoring technologies and extensive interpretation of multiple databases.  Critical care time -45 mins.    Disposition Plan:Remain in ICU Family Communication:Daughter updated on phone  Consultants:pulmonary  Code Status: FULL  DVT Prophylaxis: [place] Lovenox   Procedures: As Listed in Progress Note Above  Antibiotics: Ceftriaxone 5/7>>       Subjective: Pt postures with protopathic stimuli.  No reports of vomiting, diarrhea, uncontrolled pain, resp distress   Objective: Vitals:   06/22/18 0700 06/22/18 0719 06/22/18 0741 06/22/18 1102  BP: (!) 149/71   (!) 152/73  Pulse:    99  Resp: 20 (!) 21  (!) 23  Temp: 99 F (37.2 C) 99 F (37.2 C)  98.8 F (37.1 C)  TempSrc:      SpO2:   100% 96%  Weight:      Height:        Intake/Output Summary (Last 24 hours) at 06/22/2018 1156 Last data filed at 06/22/2018 0732 Gross per 24 hour  Intake 1812.5 ml  Output 400 ml  Net 1412.5 ml   Weight change:  Exam:   General:  Pt is alert, follows commands appropriately, not in acute distress  HEENT: No icterus, No thrush, No neck mass, [place]/AT  Cardiovascular: RRR, S1/S2, no rubs, no gallops  Respiratory: bibasilar crackles, no wheeze  Abdomen: Soft/+BS, non tender, non distended, no guarding  Extremities: No edema, No lymphangitis, No petechiae, No rashes, no synovitis   Data Reviewed: I have personally reviewed following labs and imaging studies Basic Metabolic Panel: Recent Labs  Lab 06/26/2018 1306 06/15/2018 1719 06/20/18 0344 06/21/18 0451 06/22/18 0446  NA 140  --  144 147* 145  K 4.0  --  4.1 3.8 3.4*  CL 106  --  113* 116* 116*  CO2 18*  --  16* 15* 21*  GLUCOSE 109*  --  140* 135* 170*  BUN 30*  --  30* 26* 18  CREATININE 1.16*  --  1.21* 1.00 0.65    CALCIUM 9.6  --  8.7* 8.8* 9.1  MG  --  2.3  --   --  1.8  PHOS  --   --   --   --  2.0*   Liver Function Tests: Recent Labs  Lab 06/23/2018 1306 06/22/18 0446  AST 43* 25  ALT 22 5  ALKPHOS 83 57  BILITOT 0.9 0.7  PROT 7.1 5.4*  ALBUMIN 3.8 2.5*   No results for input(s): LIPASE, AMYLASE in the last 168 hours. Recent Labs  Lab 06/20/18 0855  AMMONIA 12   Coagulation Profile: Recent Labs  Lab 06/22/2018 1306  INR 1.0   CBC: Recent Labs  Lab 07/04/2018 1306 07/10/2018 1745 06/20/18 0344 06/21/18 0451 06/22/18 0446  WBC 7.6  --  9.7 8.5 6.6  NEUTROABS 6.3  --   --   --  5.3  HGB 14.3  --  10.8* 10.1* 9.7*  HCT 45.7 39.6 34.0* 33.4* 30.3*  MCV 93.8  --  94.4 96.3 92.9  PLT 195  --  177 159 139*   Cardiac Enzymes: Recent Labs  Lab 06/26/2018 1306 07/11/2018 1551 07/04/2018 2104 06/20/18 0344 06/21/18 0451 06/22/18 0446  CKTOTAL 1,065*  --   --  721* 575* 433*  TROPONINI  --  0.05* 0.06* 0.06*  --   --    BNP: Invalid input(s): POCBNP CBG: Recent Labs  Lab 06/21/18 2005 06/22/18 0002 06/22/18 0421 06/22/18 0718 06/22/18 1103  GLUCAP 145* 164* 148* 149* 171*   HbA1C: Recent Labs    07/05/2018 1306 06/20/18 0854  HGBA1C 6.2* 6.3*   Urine analysis:    Component Value Date/Time   COLORURINE AMBER (A)  07/05/2018 1213   APPEARANCEUR CLOUDY (A) 07/13/2018 1213   LABSPEC 1.023 06/26/2018 1213   PHURINE 5.0 07/11/2018 1213   GLUCOSEU NEGATIVE 06/27/2018 1213   HGBUR MODERATE (A) 06/18/2018 1213   BILIRUBINUR NEGATIVE 06/27/2018 1213   KETONESUR 20 (A) 07/02/2018 1213   PROTEINUR 30 (A) 07/03/2018 1213   UROBILINOGEN 0.2 11/20/2012 1254   NITRITE POSITIVE (A) 07/07/2018 1213   LEUKOCYTESUR MODERATE (A) 07/05/2018 1213   Sepsis Labs: '@LABRCNTIP'$ (procalcitonin:4,lacticidven:4) ) Recent Results (from the past 240 hour(s))  SARS Coronavirus 2 (CEPHEID - Performed in [facility] hospital lab), Hosp Order     Status: None   Collection Time: 07/05/2018 12:15 PM   Result Value Ref Range Status   SARS Coronavirus 2 NEGATIVE NEGATIVE Final    Comment: (NOTE) If result is NEGATIVE SARS-CoV-2 target nucleic acids are NOT DETECTED. The SARS-CoV-2 RNA is generally detectable in upper and lower  respiratory specimens during the acute phase of infection. The lowest  concentration of SARS-CoV-2 viral copies this assay can detect is 250  copies / mL. A negative result does not preclude SARS-CoV-2 infection  and should not be used as the sole basis for treatment or other  patient management decisions.  A negative result may occur with  improper specimen collection / handling, submission of specimen other  than nasopharyngeal swab, presence of viral mutation(s) within the  areas targeted by this assay, and inadequate number of viral copies  (<250 copies / mL). A negative result must be combined with clinical  observations, patient history, and epidemiological information. If result is POSITIVE SARS-CoV-2 target nucleic acids are DETECTED. The SARS-CoV-2 RNA is generally detectable in upper and lower  respiratory specimens dur ing the acute phase of infection.  Positive  results are indicative of active infection with SARS-CoV-2.  Clinical  correlation with patient history and other diagnostic information is  necessary to determine patient infection status.  Positive results do  not rule out bacterial infection or co-infection with other viruses. If result is PRESUMPTIVE POSTIVE SARS-CoV-2 nucleic acids MAY BE PRESENT.   A presumptive positive result was obtained on the submitted specimen  and confirmed on repeat testing.  While 2019 novel coronavirus  (SARS-CoV-2) nucleic acids may be present in the submitted sample  additional confirmatory testing may be necessary for epidemiological  and / or clinical management purposes  to differentiate between  SARS-CoV-2 and other Sarbecovirus currently known to infect humans.  If clinically indicated additional  testing with an alternate test  methodology [phone]) is advised. The SARS-CoV-2 RNA is generally  detectable in upper and lower respiratory sp ecimens during the acute  phase of infection. The expected result is Negative. Fact Sheet for Patients:  StrictlyIdeas.no Fact Sheet for Healthcare Providers: BankingDealers.co.za This test is not yet approved or cleared by the [place] FDA and has been authorized for detection and/or diagnosis of SARS-CoV-2 by FDA under an Emergency Use Authorization (EUA).  This EUA will remain in effect (meaning this test can be used) for the duration of the COVID-19 declaration under Section 564(b)(1) of the Act, 21 U.S.C. section 360bbb-3(b)(1), unless the authorization is terminated or revoked sooner. Performed at [facility], [address] [place], [place] [address]   Urine culture     Status: Abnormal   Collection Time: 06/25/2018  2:38 PM  Result Value Ref Range Status   Specimen Description   Final    URINE, CLEAN CATCH Performed at [facility], [address] [place], [place] [address]  Special Requests   Final    NONE Performed at [facility], [address], [place], [place] [address]    Culture 90,000 COLONIES/mL ESCHERICHIA COLI (A)  Final   Report Status 06/21/2018 FINAL  Final   Organism ID, Bacteria ESCHERICHIA COLI (A)  Final      Susceptibility   Escherichia coli - MIC*    AMPICILLIN 4 SENSITIVE Sensitive     CEFAZOLIN <=4 SENSITIVE Sensitive     CEFTRIAXONE <=1 SENSITIVE Sensitive     CIPROFLOXACIN >=4 RESISTANT Resistant     GENTAMICIN <=1 SENSITIVE Sensitive     IMIPENEM <=0.25 SENSITIVE Sensitive     NITROFURANTOIN <=16 SENSITIVE Sensitive     TRIMETH/SULFA <=20 SENSITIVE Sensitive     AMPICILLIN/SULBACTAM <=2 SENSITIVE Sensitive     PIP/TAZO <=4 SENSITIVE Sensitive     Extended ESBL NEGATIVE Sensitive     * 90,000 COLONIES/mL ESCHERICHIA COLI  MRSA PCR Screening      Status: None   Collection Time: 06/23/2018  7:13 PM  Result Value Ref Range Status   MRSA by PCR NEGATIVE NEGATIVE Final    Comment:        The GeneXpert MRSA Assay (FDA approved for NASAL specimens only), is one component of a comprehensive MRSA colonization surveillance program. It is not intended to diagnose MRSA infection nor to guide or monitor treatment for MRSA infections. Performed at [facility], [address] [place], [place] [address]   Culture, blood (routine x 2)     Status: None (Preliminary result)   Collection Time: 06/20/2018  9:04 PM  Result Value Ref Range Status   Specimen Description BLOOD LEFT HAND  Final   Special Requests   Final    BOTTLES DRAWN AEROBIC AND ANAEROBIC Blood Culture adequate volume   Culture   Final    NO GROWTH 3 DAYS Performed at [facility], [address], [place], [place] [address]    Report Status PENDING  Incomplete  Culture, blood (routine x 2)     Status: None (Preliminary result)   Collection Time: 06/17/2018  9:05 PM  Result Value Ref Range Status   Specimen Description BLOOD RIGHT HAND  Final   Special Requests   Final    BOTTLES DRAWN AEROBIC AND ANAEROBIC Blood Culture adequate volume   Culture   Final    NO GROWTH 3 DAYS Performed at [facility], [address], [place], [place] [address]    Report Status PENDING  Incomplete     Scheduled Meds:  aspirin  300 mg Rectal Daily   carbidopa-levodopa  1 tablet Oral TID   chlorhexidine gluconate (MEDLINE KIT)  15 mL Mouth Rinse BID   Chlorhexidine Gluconate Cloth  6 each Topical Q0600   cyanocobalamin  1,000 mcg Intramuscular Daily   insulin aspart  0-9 Units Subcutaneous Q4H   levothyroxine  50 mcg Intravenous Daily   mouth rinse  15 mL Mouth Rinse 10 times per day   pramipexole  0.75 mg Oral TID   Continuous Infusions:  cefTRIAXone (ROCEPHIN)  IV Stopped (06/21/18 1434)   dextrose 5 % and 0.45 % NaCl with KCl 20 mEq/L 75 mL/hr at 06/22/18 0600   famotidine  (PEPCID) IV 20 mg (06/22/18 1019)   levETIRAcetam 500 mg (06/22/18 0732)   potassium PHOSPHATE IVPB (in mmol) 20 mmol (06/22/18 1023)    Procedures/Studies: Dg Abd 1 View  Result Date: 06/21/2018 CLINICAL DATA:  Check gastric catheter placement EXAM: ABDOMEN - 1 VIEW COMPARISON:  None.  FINDINGS: Scattered large and small bowel gas is noted. Degenerative changes of lumbar spine are noted with scoliosis concave to the right. Check gastric catheter is noted in the mid esophagus at the superior aspect of the film. This could be advanced several cm to reach the stomach. IMPRESSION: Gastric catheter in the mid esophagus. Electronically Signed   By: Inez Catalina M.D.   On: 06/21/2018 15:47   Ct Head Wo Contrast  Result Date: 06/20/2018 CLINICAL DATA:  85 year old with altered mental status. EXAM: CT HEAD WITHOUT CONTRAST TECHNIQUE: Contiguous axial images were obtained from the base of the skull through the vertex without intravenous contrast. COMPARISON:  06/16/2018 FINDINGS: Brain: Stable cerebral atrophy. Negative for acute hemorrhage, mass lesion, midline shift, hydrocephalus or large infarct. Again noted are hypodensities in the white matter suggesting chronic changes. Focal low-density in the right frontal-parietal white matter is compatible with old ischemic changes. Patient's head rotated on this examination. Vascular: No hyperdense vessel or unexpected calcification. Skull: Normal. Negative for fracture or focal lesion. Sinuses/Orbits: No acute finding. Other: None. IMPRESSION: 1. No acute intracranial abnormality. 2. Stable atrophy and chronic white matter changes. Findings are most compatible with chronic small vessel ischemic changes. Electronically Signed   By: Markus Daft M.D.   On: 06/20/2018 15:55   Ct Head Wo Contrast  Result Date: 07/02/2018 CLINICAL DATA:  85 year old female with a history of altered mental status EXAM: CT HEAD WITHOUT CONTRAST TECHNIQUE: Contiguous axial images were  obtained from the base of the skull through the vertex without intravenous contrast. COMPARISON:  12/15/2012 FINDINGS: Brain: No acute intracranial hemorrhage. No midline shift or mass effect. Gray-white differentiation maintained. Progression of senescent volume loss. Patchy hypodensities in the periventricular white matter. Unremarkable appearance of the ventricular system. Vascular: Dense calcifications of anterior and posterior circulation. Skull: No acute fracture.  No aggressive bone lesion identified. Sinuses/Orbits: Unremarkable appearance of the orbits. Mastoid air cells clear. No middle ear effusion. No significant sinus disease. Other: Endotracheal tube in position.  Fluid in the nasopharynx IMPRESSION: Negative for acute intracranial abnormality. Senescent volume loss and evidence of chronic microvascular ischemic disease. Electronically Signed   By: Corrie Mckusick D.O.   On: 07/02/2018 14:11   Dg Chest Port 1 View  Result Date: 06/22/2018 CLINICAL DATA:  Respiratory failure. EXAM: PORTABLE CHEST 1 VIEW COMPARISON:  06/21/2018 FINDINGS: The patient is rotated to the left. A right PICC has been placed and terminates over the lower SVC. An endotracheal tube terminates just below the clavicles, unchanged. An enteric tube is suboptimally visualized though now appears to extend into the upper abdomen. The cardiomediastinal silhouette is unchanged. Lung volumes remain low without evidence of airspace consolidation, edema, pleural effusion, pneumothorax. IMPRESSION: 1. Support devices including new right PICC as above. 2. Low lung volumes without evidence of acute airspace disease. Electronically Signed   By: Logan Bores M.D.   On: 06/22/2018 08:16   Dg Chest Port 1 View  Result Date: 06/21/2018 CLINICAL DATA:  Respiratory failure. EXAM: PORTABLE CHEST 1 VIEW COMPARISON:  Radiograph of Jun 20, 2018. FINDINGS: The heart size and mediastinal contours are within normal limits. Endotracheal tube is unchanged  in position. Nasogastric tube appears to be looped within the distal esophagus with tip directed back up into the more proximal esophagus. No acute pulmonary disease is noted. No pneumothorax or pleural effusion is noted. Atherosclerosis of thoracic aorta is noted. The visualized skeletal structures are unremarkable. IMPRESSION: Endotracheal tube is unchanged in position. Nasogastric tube is looped  within distal esophagus, with distal tip directed into the more proximal esophagus. No acute cardiopulmonary abnormality seen. Aortic Atherosclerosis (ICD10-I70.0). Electronically Signed   By: Marijo Conception M.D.   On: 06/21/2018 08:48   Dg Chest Port 1 View  Result Date: 06/20/2018 CLINICAL DATA:  85 year old female with repositioning of the endotracheal tube EXAM: PORTABLE CHEST 1 VIEW COMPARISON:  06/20/2018 FINDINGS: Cardiomediastinal silhouette unchanged in size and contour. No evidence of central vascular congestion. The endotracheal tube has been slightly withdrawn, now terminating 2.7 cm above the carina. There is a linear contour of the right lung of uncertain significance. There are overlying surgical lines tubes in EKG leads of the right chest somewhat obscuring evaluation. Low lung volumes. Pleuroparenchymal opacity of the right lung, may represent chronic changes and some pleural fluid. No new airspace disease. IMPRESSION: Endotracheal tube has been slightly withdrawn now terminating 2.7 cm above the carina. There is linear contour of the right lung extending in a convex configuration towards the costophrenic angle. While this is favored to represent artifact, a right pneumothorax cannot be excluded. Correlation with physical exam may be useful, as well as a repeat chest x-ray with adequate positioning and removal of the obscuring surgical line/hardware and EKG leads. These results were discussed by telephone at the time of interpretation on 06/20/2018 at 9:21 am with the nurse caring for the patient, Ms  Sherry Ruffing. Electronically Signed   By: Corrie Mckusick D.O.   On: 06/20/2018 09:21   Dg Chest Port 1 View  Result Date: 06/20/2018 CLINICAL DATA:  Unresponsive. EXAM: PORTABLE CHEST 1 VIEW COMPARISON:  Radiograph Jun 19, 2018. FINDINGS: The heart size and mediastinal contours are within normal limits. Atherosclerosis of thoracic aorta is noted. Endotracheal tube is directed into right mainstem bronchus; withdrawal by 3-4 cm is recommended. Both lungs are clear. The visualized skeletal structures are unremarkable. IMPRESSION: Endotracheal tube directed into right mainstem bronchus; withdrawal by 3-4 cm is recommended. No acute cardiopulmonary abnormality seen. These results will be called to the ordering clinician or representative by the Radiologist Assistant, and communication documented in the PACS or zVision Dashboard. Electronically Signed   By: Marijo Conception M.D.   On: 06/20/2018 08:16   Dg Chest Portable 1 View  Result Date: 07/10/2018 CLINICAL DATA:  Unresponsive. EXAM: PORTABLE CHEST 1 VIEW COMPARISON:  06/27/2010 FINDINGS: 1301 hours. Endotracheal tube tip is approximately 1.4 cm above the base of the carina. The lungs are clear without focal pneumonia, edema, pneumothorax or pleural effusion. The cardiopericardial silhouette is within normal limits for size. The visualized bony structures of the thorax are intact. Telemetry leads overlie the chest. IMPRESSION: No active disease. Electronically Signed   By: Misty Stanley M.D.   On: 06/14/2018 13:23   Dg Chest Port 1v Same Day  Result Date: 06/20/2018 CLINICAL DATA:  Intubated patient. Possible pneumothorax on plain film of the chest earlier today. EXAM: PORTABLE CHEST 1 VIEW COMPARISON:  Single-view of the chest earlier today. FINDINGS: Endotracheal tube is in place with the tip well above the carina. There is no pneumothorax. Finding described on the prior report was likely a skin fold. Lungs clear. Heart size normal. IMPRESSION: Negative for  pneumothorax.  No acute disease. ETT in good position. Electronically Signed   By: Inge Rise M.D.   On: 06/20/2018 10:16    Orson Eva, DO  [facility] Pager [phone]  If 7PM-7AM, please contact night-coverage [email or web address] Password [email or web address] 06/22/2018, 11:56 AM   LOS: 3 days

## 2018-06-23 ENCOUNTER — Inpatient Hospital Stay (HOSPITAL_COMMUNITY): Payer: Medicare Other

## 2018-06-23 DIAGNOSIS — R6339 Other feeding difficulties: Secondary | ICD-10-CM

## 2018-06-23 DIAGNOSIS — Z7189 Other specified counseling: Secondary | ICD-10-CM

## 2018-06-23 DIAGNOSIS — Z515 Encounter for palliative care: Secondary | ICD-10-CM

## 2018-06-23 DIAGNOSIS — R633 Feeding difficulties: Secondary | ICD-10-CM

## 2018-06-23 DIAGNOSIS — R4189 Other symptoms and signs involving cognitive functions and awareness: Secondary | ICD-10-CM

## 2018-06-23 LAB — GLUCOSE, CAPILLARY
Glucose-Capillary: 125 mg/dL — ABNORMAL HIGH (ref 70–99)
Glucose-Capillary: 137 mg/dL — ABNORMAL HIGH (ref 70–99)
Glucose-Capillary: 156 mg/dL — ABNORMAL HIGH (ref 70–99)
Glucose-Capillary: 164 mg/dL — ABNORMAL HIGH (ref 70–99)
Glucose-Capillary: 199 mg/dL — ABNORMAL HIGH (ref 70–99)
Glucose-Capillary: 76 mg/dL (ref 70–99)

## 2018-06-23 LAB — CBC WITH DIFFERENTIAL/PLATELET
Abs Immature Granulocytes: 0.03 10*3/uL (ref 0.00–0.07)
Basophils Absolute: 0 10*3/uL (ref 0.0–0.1)
Basophils Relative: 1 %
Eosinophils Absolute: 0.2 10*3/uL (ref 0.0–0.5)
Eosinophils Relative: 2 %
HCT: 31.7 % — ABNORMAL LOW (ref 36.0–46.0)
Hemoglobin: 9.7 g/dL — ABNORMAL LOW (ref 12.0–15.0)
Immature Granulocytes: 0 %
Lymphocytes Relative: 7 %
Lymphs Abs: 0.5 10*3/uL — ABNORMAL LOW (ref 0.7–4.0)
MCH: 28.6 pg (ref 26.0–34.0)
MCHC: 30.6 g/dL (ref 30.0–36.0)
MCV: 93.5 fL (ref 80.0–100.0)
Monocytes Absolute: 0.7 10*3/uL (ref 0.1–1.0)
Monocytes Relative: 9 %
Neutro Abs: 6 10*3/uL (ref 1.7–7.7)
Neutrophils Relative %: 81 %
Platelets: 140 10*3/uL — ABNORMAL LOW (ref 150–400)
RBC: 3.39 MIL/uL — ABNORMAL LOW (ref 3.87–5.11)
RDW: 14 % (ref 11.5–15.5)
WBC: 7.4 10*3/uL (ref 4.0–10.5)
nRBC: 0 % (ref 0.0–0.2)

## 2018-06-23 LAB — BLOOD GAS, ARTERIAL
Acid-base deficit: 3.4 mmol/L — ABNORMAL HIGH (ref 0.0–2.0)
Bicarbonate: 21.6 mmol/L (ref 20.0–28.0)
FIO2: 35
O2 Saturation: 82.9 %
Patient temperature: 37.2
pCO2 arterial: 33.8 mmHg (ref 32.0–48.0)
pH, Arterial: 7.402 (ref 7.350–7.450)
pO2, Arterial: 49 mmHg — ABNORMAL LOW (ref 83.0–108.0)

## 2018-06-23 LAB — BASIC METABOLIC PANEL
Anion gap: 7 (ref 5–15)
BUN: 15 mg/dL (ref 8–23)
CO2: 20 mmol/L — ABNORMAL LOW (ref 22–32)
Calcium: 8.7 mg/dL — ABNORMAL LOW (ref 8.9–10.3)
Chloride: 112 mmol/L — ABNORMAL HIGH (ref 98–111)
Creatinine, Ser: 0.58 mg/dL (ref 0.44–1.00)
GFR calc Af Amer: >60 mL/min (ref >60)
GFR calc non Af Amer: >60 mL/min (ref >60)
Glucose, Bld: 187 mg/dL — ABNORMAL HIGH (ref 70–99)
Potassium: 4.1 mmol/L (ref 3.5–5.1)
Sodium: 139 mmol/L (ref 135–145)

## 2018-06-23 LAB — PHOSPHORUS: Phosphorus: 2.4 mg/dL — ABNORMAL LOW (ref 2.5–4.6)

## 2018-06-23 LAB — MAGNESIUM: Magnesium: 1.5 mg/dL — ABNORMAL LOW (ref 1.7–2.4)

## 2018-06-23 MED ORDER — CARBIDOPA-LEVODOPA 25-100 MG PO TBDP
1.0000 | ORAL_TABLET | Freq: Three times a day (TID) | ORAL | Status: DC
  Administered 2018-06-23 – 2018-06-24 (×4): 1 via ORAL
  Filled 2018-06-23 (×6): qty 1

## 2018-06-23 MED ORDER — ROTIGOTINE 4 MG/24HR TD PT24
1.0000 | MEDICATED_PATCH | Freq: Every day | TRANSDERMAL | Status: DC
  Administered 2018-06-23 – 2018-06-30 (×6): 1 via TRANSDERMAL
  Filled 2018-06-23 (×10): qty 1

## 2018-06-23 MED ORDER — OSMOLITE 1.5 CAL PO LIQD
1000.0000 mL | ORAL | Status: DC
  Administered 2018-06-23 – 2018-06-27 (×5): 1000 mL
  Filled 2018-06-23 (×7): qty 1000

## 2018-06-23 MED ORDER — POTASSIUM PHOSPHATES 15 MMOLE/5ML IV SOLN
20.0000 mmol | Freq: Once | INTRAVENOUS | Status: AC
  Administered 2018-06-23: 20 mmol via INTRAVENOUS
  Filled 2018-06-23: qty 6.67

## 2018-06-23 MED ORDER — MAGNESIUM SULFATE 2 GM/50ML IV SOLN
2.0000 g | Freq: Once | INTRAVENOUS | Status: AC
  Administered 2018-06-23: 2 g via INTRAVENOUS
  Filled 2018-06-23: qty 50

## 2018-06-23 NOTE — Progress Notes (Signed)
Pt returned to full support ventilation prior to xray transport

## 2018-06-23 NOTE — TOC Progression Note (Signed)
Reviewed pt's record. Pt medium risk on re-admission scale. Pt currently intubated with guarded prognosis per MD notes. Palliative Care consult requested by MD. Will follow and assist if TOC needs arise.

## 2018-06-23 NOTE — Progress Notes (Signed)
Reynolds A. Merlene Laughter, MD     [email or web address]          Jennifer [name] is an 85 y.o. female.   Assessment/Plan: 1.  Acute encephalopathy: Etiology likely multifactorial including acute UTI and possible acute ischemic stroke or seizures given the patient's eyes are with eyes deviated to the right.    The eye deviation has improved and the repeat imaging does not show a large infarct.  2.  Parkinson disease: The patient's examination indicated she is more parkinsonian off medication today.  The patient has been placed on NG-tube by radiology.  We will restart the patient on her Sinemet.  Hopefully this will help with increased movement and also mentation.  3.  Vitamin B-12 deficiency: This will be replaced.   Patient overall remains unchanged.  Status post NG tube placement by radiology.   GENERAL: This is a thin lady who is intubated.  The examination is done while the patient is on fentanyl infusion.  HEENT: Neck is supple no trauma appreciated  ABDOMEN: soft  EXTREMITIES: No edema   BACK: Normal  SKIN: Normal by inspection.    MENTAL STATUS: There is no eye opening.  There is no verbal output or attempt to follow commands.  CRANIAL NERVES: Pupils are equal, round and reactive to light; eyes are deviated to the right.  The eyes are somewhat deviated to the right but she has roving eye movements in both directions. Extra ocular movements are full with oculocephalic reflexes, upper and lower facial muscles are normal in strength and symmetric, there is no flattening of the nasolabial folds  MOTOR: There is mild flexion withdrawal to deep painful stimuli bilaterally.  COORDINATION: No tremor is noted.  There is severe there rigidity bilaterally and severe bradykinesia.  REFLEXES: Deep tendon reflexes are symmetrical and normal.  Plantar responses are classically upgoing on the left and equivocal on the right.  SENSATION: Normal to pain.     The repeat brain CT scan is reviewed in person.  There is moderate deep white matter and periventricular leukoencephalopathy.  No acute changes are noted.  No change since the previous one done yesterday indicating the evidence of large hemispheric stroke.   EEG shows moderate slowing.  There is also frontocentral delta slowing episodically.   Objective: Vital signs in last 24 hours: Temp:  [98.6 F (37 C)-99.7 F (37.6 C)] 99.7 F (37.6 C) (05/11 1930) Pulse Rate:  [31-106] 101 (05/11 1930) Resp:  [16-30] 25 (05/11 1930) BP: (106-172)/(45-79) 141/59 (05/11 1930) SpO2:  [95 %-100 %] 100 % (05/11 1943) FiO2 (%):  [35 %-40 %] 40 % (05/11 1943) Weight:  [65.3 kg] 65.3 kg (05/11 0500)  Intake/Output from previous day: 05/10 0701 - 05/11 0700 In: 2715.2 [I.V.:1601.6; IV Piggyback:993.7] Out: 250 [Urine:250] Intake/Output this shift: No intake/output data recorded. Nutritional status:  Diet Order    None       Lab Results: Results for orders placed or performed during the hospital encounter of 06/21/2018 (from the past 48 hour(s))  Glucose, capillary     Status: Abnormal   Collection Time: 06/22/18 12:02 AM  Result Value Ref Range   Glucose-Capillary 164 (H) 70 - 99 mg/dL  Blood gas, arterial     Status: Abnormal   Collection Time: 06/22/18  3:58 AM  Result Value Ref Range   FIO2 30.00    Delivery systems PRESSURE REGULATED VOLUME CONTROL    Mode VENTILATOR    VT 420 mL   LHR  16 resp/min   Peep/cpap 5.0 cm H20   pH, Arterial 7.402 7.350 - 7.450   pCO2 arterial 31.7 (L) 32.0 - 48.0 mmHg   pO2, Arterial 69.6 (L) 83.0 - 108.0 mmHg   Bicarbonate 20.9 20.0 - 28.0 mmol/L   Acid-base deficit 4.6 (H) 0.0 - 2.0 mmol/L   O2 Saturation 93.6 %   Patient temperature 37.0    Allens test (pass/fail) PASS PASS    Comment: Performed at [facility], [address] [place], [place] [address]  Glucose, capillary     Status: Abnormal   Collection Time: 06/22/18  4:21 AM  Result  Value Ref Range   Glucose-Capillary 148 (H) 70 - 99 mg/dL   Comment 1 Notify RN    Comment 2 Document in Chart   Comprehensive metabolic panel     Status: Abnormal   Collection Time: 06/22/18  4:46 AM  Result Value Ref Range   Sodium 145 135 - 145 mmol/L   Potassium 3.4 (L) 3.5 - 5.1 mmol/L   Chloride 116 (H) 98 - 111 mmol/L   CO2 21 (L) 22 - 32 mmol/L   Glucose, Bld 170 (H) 70 - 99 mg/dL   BUN 18 8 - 23 mg/dL   Creatinine, Ser 0.65 0.44 - 1.00 mg/dL   Calcium 9.1 8.9 - 10.3 mg/dL   Total Protein 5.4 (L) 6.5 - 8.1 g/dL   Albumin 2.5 (L) 3.5 - 5.0 g/dL   AST 25 15 - 41 U/L   ALT 5 0 - 44 U/L   Alkaline Phosphatase 57 38 - 126 U/L   Total Bilirubin 0.7 0.3 - 1.2 mg/dL   GFR calc non Af Amer >60 >60 mL/min   GFR calc Af Amer >60 >60 mL/min   Anion gap 8 5 - 15    Comment: Performed at [facility], [address] [place], [place] [address]  CBC with Differential/Platelet     Status: Abnormal   Collection Time: 06/22/18  4:46 AM  Result Value Ref Range   WBC 6.6 4.0 - 10.5 K/uL   RBC 3.26 (L) 3.87 - 5.11 MIL/uL   Hemoglobin 9.7 (L) 12.0 - 15.0 g/dL   HCT 30.3 (L) 36.0 - 46.0 %   MCV 92.9 80.0 - 100.0 fL   MCH 29.8 26.0 - 34.0 pg   MCHC 32.0 30.0 - 36.0 g/dL   RDW 14.3 11.5 - 15.5 %   Platelets 139 (L) 150 - 400 K/uL   nRBC 0.0 0.0 - 0.2 %   Neutrophils Relative % 79 %   Neutro Abs 5.3 1.7 - 7.7 K/uL   Lymphocytes Relative 8 %   Lymphs Abs 0.6 (L) 0.7 - 4.0 K/uL   Monocytes Relative 9 %   Monocytes Absolute 0.6 0.1 - 1.0 K/uL   Eosinophils Relative 2 %   Eosinophils Absolute 0.1 0.0 - 0.5 K/uL   Basophils Relative 1 %   Basophils Absolute 0.0 0.0 - 0.1 K/uL   Immature Granulocytes 1 %   Abs Immature Granulocytes 0.04 0.00 - 0.07 K/uL    Comment: Performed at [facility], [address] [place], [place] [address]  CK     Status: Abnormal   Collection Time: 06/22/18  4:46 AM  Result Value Ref Range   Total CK 433 (H) 38 - 234 U/L    Comment: Performed at [facility], [address], [place], [place] [address]  Prealbumin     Status: Abnormal   Collection Time: 06/22/18  4:46  AM  Result Value Ref Range   Prealbumin 6.4 (L) 18 - 38 mg/dL    Comment: Performed at [facility] [address] [place], [place] [address]  Magnesium     Status: None   Collection Time: 06/22/18  4:46 AM  Result Value Ref Range   Magnesium 1.8 1.7 - 2.4 mg/dL    Comment: Performed at [facility], [address] [place], [place] [address]  Phosphorus     Status: Abnormal   Collection Time: 06/22/18  4:46 AM  Result Value Ref Range   Phosphorus 2.0 (L) 2.5 - 4.6 mg/dL    Comment: Performed at [facility], [address] [place], [place] [address]  Glucose, capillary     Status: Abnormal   Collection Time: 06/22/18  7:18 AM  Result Value Ref Range   Glucose-Capillary 149 (H) 70 - 99 mg/dL  Glucose, capillary     Status: Abnormal   Collection Time: 06/22/18 11:03 AM  Result Value Ref Range   Glucose-Capillary 171 (H) 70 - 99 mg/dL  Glucose, capillary     Status: Abnormal   Collection Time: 06/22/18  4:27 PM  Result Value Ref Range   Glucose-Capillary 183 (H) 70 - 99 mg/dL  Glucose, capillary     Status: Abnormal   Collection Time: 06/22/18  8:06 PM  Result Value Ref Range   Glucose-Capillary 172 (H) 70 - 99 mg/dL   Comment 1 Notify RN    Comment 2 Document in Chart   Glucose, capillary     Status: Abnormal   Collection Time: 06/23/18 12:16 AM  Result Value Ref Range   Glucose-Capillary 125 (H) 70 - 99 mg/dL   Comment 1 Notify RN    Comment 2 Document in Chart   Glucose, capillary     Status: Abnormal   Collection Time: 06/23/18  4:48 AM  Result Value Ref Range   Glucose-Capillary 164 (H) 70 - 99 mg/dL   Comment 1 Notify RN    Comment 2 Document in Chart   Blood gas, arterial     Status: Abnormal   Collection Time: 06/23/18  5:00 AM  Result Value Ref Range   FIO2 35.00    pH, Arterial 7.402 7.350 - 7.450   pCO2 arterial 33.8 32.0 - 48.0 mmHg    pO2, Arterial 49.0 (L) 83.0 - 108.0 mmHg   Bicarbonate 21.6 20.0 - 28.0 mmol/L   Acid-base deficit 3.4 (H) 0.0 - 2.0 mmol/L   O2 Saturation 82.9 %   Patient temperature 37.2    Allens test (pass/fail) PASS PASS    Comment: Performed at [facility], [address] [place], [place] [address]  Basic metabolic panel     Status: Abnormal   Collection Time: 06/23/18  5:06 AM  Result Value Ref Range   Sodium 139 135 - 145 mmol/L   Potassium 4.1 3.5 - 5.1 mmol/L    Comment: DELTA CHECK NOTED   Chloride 112 (H) 98 - 111 mmol/L   CO2 20 (L) 22 - 32 mmol/L   Glucose, Bld 187 (H) 70 - 99 mg/dL   BUN 15 8 - 23 mg/dL   Creatinine, Ser 0.58 0.44 - 1.00 mg/dL   Calcium 8.7 (L) 8.9 - 10.3 mg/dL   GFR calc non Af Amer >60 >60 mL/min   GFR calc Af Amer >60 >60 mL/min   Anion gap 7 5 - 15    Comment: Performed at [facility], [address] [place], [place] [address]  Magnesium     Status: Abnormal   Collection Time: 06/23/18  5:06 AM  Result Value Ref Range   Magnesium 1.5 (L) 1.7 - 2.4 mg/dL    Comment: Performed at [facility], [address], [place], [place] [address]  Phosphorus     Status: Abnormal   Collection Time: 06/23/18  5:06 AM  Result Value Ref Range   Phosphorus 2.4 (L) 2.5 - 4.6 mg/dL    Comment: Performed at [facility], [address], [place], [place] [address]  CBC with Differential/Platelet     Status: Abnormal   Collection Time: 06/23/18  5:06 AM  Result Value Ref Range   WBC 7.4 4.0 - 10.5 K/uL   RBC 3.39 (L) 3.87 - 5.11 MIL/uL   Hemoglobin 9.7 (L) 12.0 - 15.0 g/dL   HCT 31.7 (L) 36.0 - 46.0 %   MCV 93.5 80.0 - 100.0 fL   MCH 28.6 26.0 - 34.0 pg   MCHC 30.6 30.0 - 36.0 g/dL   RDW 14.0 11.5 - 15.5 %   Platelets 140 (L) 150 - 400 K/uL   nRBC 0.0 0.0 - 0.2 %   Neutrophils Relative % 81 %   Neutro Abs 6.0 1.7 - 7.7 K/uL   Lymphocytes Relative 7 %   Lymphs Abs 0.5 (L) 0.7 - 4.0 K/uL   Monocytes Relative 9 %   Monocytes Absolute 0.7 0.1 - 1.0 K/uL   Eosinophils  Relative 2 %   Eosinophils Absolute 0.2 0.0 - 0.5 K/uL   Basophils Relative 1 %   Basophils Absolute 0.0 0.0 - 0.1 K/uL   Immature Granulocytes 0 %   Abs Immature Granulocytes 0.03 0.00 - 0.07 K/uL    Comment: Performed at [facility], [address], [place], [place] [address]  Glucose, capillary     Status: Abnormal   Collection Time: 06/23/18  8:09 AM  Result Value Ref Range   Glucose-Capillary 156 (H) 70 - 99 mg/dL  Glucose, capillary     Status: Abnormal   Collection Time: 06/23/18 11:28 AM  Result Value Ref Range   Glucose-Capillary 137 (H) 70 - 99 mg/dL  Glucose, capillary     Status: None   Collection Time: 06/23/18  4:20 PM  Result Value Ref Range   Glucose-Capillary 76 70 - 99 mg/dL  Glucose, capillary     Status: Abnormal   Collection Time: 06/23/18  7:48 PM  Result Value Ref Range   Glucose-Capillary 199 (H) 70 - 99 mg/dL    Lipid Panel No results for input(s): CHOL, TRIG, HDL, CHOLHDL, VLDL, LDLCALC in the last 72 hours.  Studies/Results:  REPEAT HEAD CT  FINDINGS: Brain: Stable cerebral atrophy. Negative for acute hemorrhage, mass lesion, midline shift, hydrocephalus or large infarct. Again noted are hypodensities in the white matter suggesting chronic changes. Focal low-density in the right frontal-parietal white matter is compatible with old ischemic changes. Patient's head rotated on this examination.  Vascular: No hyperdense vessel or unexpected calcification.  Skull: Normal. Negative for fracture or focal lesion.  Sinuses/Orbits: No acute finding.  Other: None.  IMPRESSION: 1. No acute intracranial abnormality. 2. Stable atrophy and chronic white matter changes. Findings are most compatible with chronic small vessel ischemic changes.     Medications:  Scheduled Meds: . aspirin  300 mg Rectal Daily  . carbidopa-levodopa  1 tablet Oral TID  . chlorhexidine gluconate (MEDLINE KIT)  15 mL Mouth Rinse BID  . Chlorhexidine Gluconate Cloth   6 each Topical Q0600  .  cyanocobalamin  1,000 mcg Intramuscular Daily  . feeding supplement (OSMOLITE 1.5 CAL)  1,000 mL Per Tube Q24H  . insulin aspart  0-9 Units Subcutaneous Q4H  . levothyroxine  50 mcg Intravenous Daily  . mouth rinse  15 mL Mouth Rinse 10 times per day  . rotigotine  1 patch Transdermal Daily   Continuous Infusions: . dextrose 5 % and 0.45 % NaCl with KCl 20 mEq/L 75 mL/hr at 06/23/18 2001  . famotidine (PEPCID) IV Stopped (06/23/18 1030)  . levETIRAcetam 500 mg (06/23/18 1957)  . potassium PHOSPHATE IVPB (in mmol) 85 mL/hr at 06/23/18 1800   PRN Meds:.acetaminophen, albuterol, fentaNYL (SUBLIMAZE) injection, fentaNYL (SUBLIMAZE) injection, ondansetron (ZOFRAN) IV     LOS: 4 days   Laronda Lisby A. Merlene Laughter, M.D.  Diplomate, Tax adviser of Psychiatry and Neurology ( Neurology).

## 2018-06-23 NOTE — Progress Notes (Signed)
    Subjective: Nursing staff states patient unresponsive. When adjusting patient's gown, pulls hands in towards self. No response with abdominal exam.   Objective: Vital signs in last 24 hours: Temp:  [98.4 F (36.9 C)-99.5 F (37.5 C)] 99.1 F (37.3 C) (05/11 0730) Pulse Rate:  [31-106] 106 (05/11 0730) Resp:  [16-29] 29 (05/11 0730) BP: (106-169)/(45-85) 159/63 (05/11 0730) SpO2:  [96 %-100 %] 99 % (05/11 0730) FiO2 (%):  [30 %-40 %] 35 % (05/11 0430) Weight:  [65.3 kg] 65.3 kg (05/11 0500)   General:   Unresponsive, on vent. OG tube in place.  Abdomen:  Bowel sounds present, soft, no response with deep palpation.  Extremities:  Upper extremities with anasarca, no edema lower extremities    Intake/Output from previous day: 05/10 0701 - 05/11 0700 In: 2715.2 [I.V.:1601.6; IV Piggyback:993.7] Out: 250 [Urine:250] Intake/Output this shift: No intake/output data recorded.  Lab Results: Recent Labs    06/21/18 0451 06/22/18 0446 06/23/18 0506  WBC 8.5 6.6 7.4  HGB 10.1* 9.7* 9.7*  HCT 33.4* 30.3* 31.7*  PLT 159 139* 140*   BMET Recent Labs    06/21/18 0451 06/22/18 0446 06/23/18 0506  NA 147* 145 139  K 3.8 3.4* 4.1  CL 116* 116* 112*  CO2 15* 21* 20*  GLUCOSE 135* 170* 187*  BUN 26* 18 15  CREATININE 1.00 0.65 0.58  CALCIUM 8.8* 9.1 8.7*   LFT Recent Labs    06/22/18 0446  PROT 5.4*  ALBUMIN 2.5*  AST 25  ALT 5  ALKPHOS 57  BILITOT 0.7    Studies/Results: Dg Abd 1 View  Result Date: 06/21/2018 CLINICAL DATA:  Check gastric catheter placement EXAM: ABDOMEN - 1 VIEW COMPARISON:  None. FINDINGS: Scattered large and small bowel gas is noted. Degenerative changes of lumbar spine are noted with scoliosis concave to the right. Check gastric catheter is noted in the mid esophagus at the superior aspect of the film. This could be advanced several cm to reach the stomach. IMPRESSION: Gastric catheter in the mid esophagus. Electronically Signed   By: Alcide Clever M.D.   On: 06/21/2018 15:47   Dg Chest Port 1 View  Result Date: 06/22/2018 CLINICAL DATA:  Respiratory failure. EXAM: PORTABLE CHEST 1 VIEW COMPARISON:  06/21/2018 FINDINGS: The patient is rotated to the left. A right PICC has been placed and terminates over the lower SVC. An endotracheal tube terminates just below the clavicles, unchanged. An enteric tube is suboptimally visualized though now appears to extend into the upper abdomen. The cardiomediastinal silhouette is unchanged. Lung volumes remain low without evidence of airspace consolidation, edema, pleural effusion, pneumothorax. IMPRESSION: 1. Support devices including new right PICC as above. 2. Low lung volumes without evidence of acute airspace disease. Electronically Signed   By: Sebastian Ache M.D.   On: 06/22/2018 08:16    Assessment/Plan: 85 year old female admitted with acute encephalopathy, with GI consulted to assist with OG. Remains unresponsive. Discussed with Dr. Arbutus Leas, who will be arranging tube placement with IR. Guarded prognosis. Will sign off.    Gelene Mink, PhD, ANP-BC [facility]    LOS: 4 days    06/23/2018, 8:04 AM

## 2018-06-23 NOTE — Progress Notes (Signed)
Backed off On FIO2 to 35 as rate came down to 22. Saturation 100.

## 2018-06-23 NOTE — Progress Notes (Signed)
PROGRESS NOTE  Jennifer [name] ZRA:[id number] DOB: April 17, 1930 DOA: 07/02/2018 PCP: [name], MD  Brief History: 85 year old female with a history of TIA, Parkinson's disease, hypothyroidism, hypertension, diabetes mellitus, hyperlipidemia, PSVT status post RFA 2006, and tobacco abuse in remission presented with unresponsiveness. The patient lives by herself, but her granddaughter intermittently checks up on her. On 06/18/2018, the patient granddaughter noted the patient to be lethargic, but felt that the patient was just tired and sleepy. She went back to check up on the patient on the morning of 07/01/2018 and noted the patient to be essentially unresponsive. EMS was activated. In the emergency department, the patient was intubated to protect her airway secondary to her encephalopathy. According to the patient's daughter, the patient has had a long history of poor compliance with her medications. It was felt in the past that her encephalopathy may have been partly attributed to not taking her Parkinson's medicines as well as he thyroid replacement. However, the patient has never been unresponsive. There is been no history of recent fevers, chills, complaints of chest pain, shortness breath, vomiting, diarrhea. At baseline, the patient has some pleasant confusion but is able to converse. She normally gets around in a wheelchair, but occasionally uses a walker. In the emergency department, the patient had temperature 100.4 F, but blood pressure as high as 208/73. EKG showed sinus tachycardia without any ST-T wave changes. CPK was 1065. UA showed 21-50 WBC. Otherwise BMP and CBC were essentially unremarkable. CT of the brain was negative for any acute findings. Chest x-ray was negative for any infiltrates. Neurology and pulmonary were consulted to assist with management.  Assessment/Plan: Acute encephalopathy, type unspecified -Etiology unclear although likely multifactorial  including UTI, B12 deficiency, possible seizure, levodopa withdraw -Appreciate neurology consult -06/23/2018 CT brain negative for acute findings -MRI of brain--cannot obtain while on ventilator -06/20/18 CT brain--neg for acute findings -Folic LKTG--2.5 -WLSLHTD--42 -TSH 0.462 -Urine drug screen negative -Personally reviewed EKG--sinus rhythm, no ST-T wave changes -06/23/2018--patient remainsminimally responsivetoprotopathic stimuli despite being off sedation -Obtained EEG--global slowing and triphasics consistent with metabolic encephalopathy -continue empiric keppra -have asked pharmacy to try to obtain SL carbidopa/levodopa (Parcopa) or transdermal dopamine agonist (Neupro/rotigotine)--unclear how much po meds getting absorbed  UTI--EColi -Continue ceftriaxone--finished 5 days 5/11  B12 deficiency -Repleting IM daily  Parkinson's Disease -having difficulty obtaining NG/OG per nursing despite multiple attempts -discussed with IR--not able to place until next week -appreciate GI assistance-->OG placed to mid-esophagus-->trial of meds only with patient sitting at 45 degrees -Start SL carbidopa/levodopa (Parcopa) or transdermal dopamine agonist (Neupro/rotigotine)  Acute respiratory failure with hypoxia -Secondary to hypoventilation from encephalopathy -Currently on mechanical ventilation -personally reviewed CXR--no edema or infiltrates  Nontraumatic rhabdomyolysis -Initial CPK 1065>>>575 -Judicious IV fluids  Elevated troponin -Secondary to demand ischemia -Trend is flat -Echocardiogram--60-65%, no PFO, grade 1 DD  FEN -NG/OG difficulties as noted above -place PICC for TPN if unable to get enteral feeding -change to D51/2NS  Diabetes mellitus type 2 -Holding glipizide and Tradjenta -NovoLog sliding scale -Hemoglobin A1c--6.2  Hypothyroidism -ContinueIV Synthroid  Hyperlipidemia -Restart statin once able to tolerate   The patient is critically ill  with multiple organ systems failure and requires high complexity decision making for assessment and support, frequent evaluation and titration of therapies, application of advanced monitoring technologies and extensive interpretation of multiple databases.  Critical care time -35 mins.    Disposition Plan:Remain in ICU Family Communication:Daughter updated on phone 5/11  Consultants:pulmonary,  palliative  Code Status: FULL  DVT Prophylaxis: [place] Lovenox   Procedures: As Listed in Progress Note Above  Antibiotics: Ceftriaxone 5/7>>5/11    Subjective: Pt postures with protopathic stimuli.  No vomiting, diarrhea, respiratory distress. Pt remains on ventilator.  ROS unobtainable due to encephalopathy  Objective: Vitals:   06/23/18 1030 06/23/18 1100 06/23/18 1126 06/23/18 1241  BP: (!) 127/50 (!) 106/45    Pulse: 95 94    Resp: (!) 25 (!) 25    Temp: 99.1 F (37.3 C) 99.1 F (37.3 C)    TempSrc:   Core   SpO2: 98% 96%  99%  Weight:      Height:        Intake/Output Summary (Last 24 hours) at 06/23/2018 1332 Last data filed at 06/23/2018 1100 Gross per 24 hour  Intake 2404.22 ml  Output 250 ml  Net 2154.22 ml   Weight change:  Exam:   General:  Pt is on vent.  No distress. Does not follow commands; postures to protopathic stiumuli  HEENT: No icterus, No thrush, No neck mass, [place]/AT  Cardiovascular: RRR, S1/S2, no rubs, no gallops  Respiratory: CTA bilaterally, no wheezing, no crackles, no rhonchi  Abdomen: Soft/+BS, non tender, non distended, no guarding  Extremities: No edema, No lymphangitis, No petechiae, No rashes, no synovitis   Data Reviewed: I have personally reviewed following labs and imaging studies Basic Metabolic Panel: Recent Labs  Lab 07/10/2018 1306 06/22/2018 1719 06/20/18 0344 06/21/18 0451 06/22/18 0446 06/23/18 0506  NA 140  --  144 147* 145 139  K 4.0  --  4.1 3.8 3.4* 4.1  CL 106  --  113* 116* 116* 112*  CO2  18*  --  16* 15* 21* 20*  GLUCOSE 109*  --  140* 135* 170* 187*  BUN 30*  --  30* 26* 18 15  CREATININE 1.16*  --  1.21* 1.00 0.65 0.58  CALCIUM 9.6  --  8.7* 8.8* 9.1 8.7*  MG  --  2.3  --   --  1.8 1.5*  PHOS  --   --   --   --  2.0* 2.4*   Liver Function Tests: Recent Labs  Lab 07/11/2018 1306 06/22/18 0446  AST 43* 25  ALT 22 5  ALKPHOS 83 57  BILITOT 0.9 0.7  PROT 7.1 5.4*  ALBUMIN 3.8 2.5*   No results for input(s): LIPASE, AMYLASE in the last 168 hours. Recent Labs  Lab 06/20/18 0855  AMMONIA 12   Coagulation Profile: Recent Labs  Lab 06/18/2018 1306  INR 1.0   CBC: Recent Labs  Lab 06/27/2018 1306 06/23/2018 1745 06/20/18 0344 06/21/18 0451 06/22/18 0446 06/23/18 0506  WBC 7.6  --  9.7 8.5 6.6 7.4  NEUTROABS 6.3  --   --   --  5.3 6.0  HGB 14.3  --  10.8* 10.1* 9.7* 9.7*  HCT 45.7 39.6 34.0* 33.4* 30.3* 31.7*  MCV 93.8  --  94.4 96.3 92.9 93.5  PLT 195  --  177 159 139* 140*   Cardiac Enzymes: Recent Labs  Lab 06/25/2018 1306 06/18/2018 1551 06/21/2018 2104 06/20/18 0344 06/21/18 0451 06/22/18 0446  CKTOTAL 1,065*  --   --  721* 575* 433*  TROPONINI  --  0.05* 0.06* 0.06*  --   --    BNP: Invalid input(s): POCBNP CBG: Recent Labs  Lab 06/22/18 2006 06/23/18 0016 06/23/18 0448 06/23/18 0809 06/23/18 1128  GLUCAP 172* 125* 164* 156* 137*   HbA1C: No  results for input(s): HGBA1C in the last 72 hours. Urine analysis:    Component Value Date/Time   COLORURINE AMBER (A) 06/18/2018 1213   APPEARANCEUR CLOUDY (A) 07/04/2018 1213   LABSPEC 1.023 06/18/2018 1213   PHURINE 5.0 07/12/2018 1213   GLUCOSEU NEGATIVE 06/29/2018 1213   HGBUR MODERATE (A) 06/21/2018 1213   BILIRUBINUR NEGATIVE 07/12/2018 1213   KETONESUR 20 (A) 07/01/2018 1213   PROTEINUR 30 (A) 06/27/2018 1213   UROBILINOGEN 0.2 11/20/2012 1254   NITRITE POSITIVE (A) 07/07/2018 1213   LEUKOCYTESUR MODERATE (A) 06/13/2018 1213   Sepsis  Labs: '@LABRCNTIP'$ (procalcitonin:4,lacticidven:4) ) Recent Results (from the past 240 hour(s))  SARS Coronavirus 2 (CEPHEID - Performed in [facility] hospital lab), Hosp Order     Status: None   Collection Time: 06/26/2018 12:15 PM  Result Value Ref Range Status   SARS Coronavirus 2 NEGATIVE NEGATIVE Final    Comment: (NOTE) If result is NEGATIVE SARS-CoV-2 target nucleic acids are NOT DETECTED. The SARS-CoV-2 RNA is generally detectable in upper and lower  respiratory specimens during the acute phase of infection. The lowest  concentration of SARS-CoV-2 viral copies this assay can detect is 250  copies / mL. A negative result does not preclude SARS-CoV-2 infection  and should not be used as the sole basis for treatment or other  patient management decisions.  A negative result may occur with  improper specimen collection / handling, submission of specimen other  than nasopharyngeal swab, presence of viral mutation(s) within the  areas targeted by this assay, and inadequate number of viral copies  (<250 copies / mL). A negative result must be combined with clinical  observations, patient history, and epidemiological information. If result is POSITIVE SARS-CoV-2 target nucleic acids are DETECTED. The SARS-CoV-2 RNA is generally detectable in upper and lower  respiratory specimens dur ing the acute phase of infection.  Positive  results are indicative of active infection with SARS-CoV-2.  Clinical  correlation with patient history and other diagnostic information is  necessary to determine patient infection status.  Positive results do  not rule out bacterial infection or co-infection with other viruses. If result is PRESUMPTIVE POSTIVE SARS-CoV-2 nucleic acids MAY BE PRESENT.   A presumptive positive result was obtained on the submitted specimen  and confirmed on repeat testing.  While 2019 novel coronavirus  (SARS-CoV-2) nucleic acids may be present in the submitted sample   additional confirmatory testing may be necessary for epidemiological  and / or clinical management purposes  to differentiate between  SARS-CoV-2 and other Sarbecovirus currently known to infect humans.  If clinically indicated additional testing with an alternate test  methodology [phone]) is advised. The SARS-CoV-2 RNA is generally  detectable in upper and lower respiratory sp ecimens during the acute  phase of infection. The expected result is Negative. Fact Sheet for Patients:  StrictlyIdeas.no Fact Sheet for Healthcare Providers: BankingDealers.co.za This test is not yet approved or cleared by the [place] FDA and has been authorized for detection and/or diagnosis of SARS-CoV-2 by FDA under an Emergency Use Authorization (EUA).  This EUA will remain in effect (meaning this test can be used) for the duration of the COVID-19 declaration under Section 564(b)(1) of the Act, 21 U.S.C. section 360bbb-3(b)(1), unless the authorization is terminated or revoked sooner. Performed at [facility], [address], [place], [place] [address]   Urine culture     Status: Abnormal   Collection Time: 06/21/2018  2:38 PM  Result Value Ref Range Status   Specimen  Description   Final    URINE, CLEAN CATCH Performed at [facility], [address] [place], [place] [address]    Special Requests   Final    NONE Performed at [facility], [address], [place], [place] [address]    Culture 90,000 COLONIES/mL ESCHERICHIA COLI (A)  Final   Report Status 06/21/2018 FINAL  Final   Organism ID, Bacteria ESCHERICHIA COLI (A)  Final      Susceptibility   Escherichia coli - MIC*    AMPICILLIN 4 SENSITIVE Sensitive     CEFAZOLIN <=4 SENSITIVE Sensitive     CEFTRIAXONE <=1 SENSITIVE Sensitive     CIPROFLOXACIN >=4 RESISTANT Resistant     GENTAMICIN <=1 SENSITIVE Sensitive     IMIPENEM <=0.25 SENSITIVE Sensitive     NITROFURANTOIN <=16 SENSITIVE  Sensitive     TRIMETH/SULFA <=20 SENSITIVE Sensitive     AMPICILLIN/SULBACTAM <=2 SENSITIVE Sensitive     PIP/TAZO <=4 SENSITIVE Sensitive     Extended ESBL NEGATIVE Sensitive     * 90,000 COLONIES/mL ESCHERICHIA COLI  MRSA PCR Screening     Status: None   Collection Time: 07/11/2018  7:13 PM  Result Value Ref Range Status   MRSA by PCR NEGATIVE NEGATIVE Final    Comment:        The GeneXpert MRSA Assay (FDA approved for NASAL specimens only), is one component of a comprehensive MRSA colonization surveillance program. It is not intended to diagnose MRSA infection nor to guide or monitor treatment for MRSA infections. Performed at [facility], [address], [place], [place] [address]   Culture, blood (routine x 2)     Status: None (Preliminary result)   Collection Time: 06/22/2018  9:04 PM  Result Value Ref Range Status   Specimen Description BLOOD LEFT HAND  Final   Special Requests   Final    BOTTLES DRAWN AEROBIC AND ANAEROBIC Blood Culture adequate volume   Culture   Final    NO GROWTH 4 DAYS Performed at [facility], [address] [place], [place] [address]    Report Status PENDING  Incomplete  Culture, blood (routine x 2)     Status: None (Preliminary result)   Collection Time: 06/15/2018  9:05 PM  Result Value Ref Range Status   Specimen Description BLOOD RIGHT HAND  Final   Special Requests   Final    BOTTLES DRAWN AEROBIC AND ANAEROBIC Blood Culture adequate volume   Culture   Final    NO GROWTH 4 DAYS Performed at [facility], [address], [place],  [address]    Report Status PENDING  Incomplete     Scheduled Meds:  aspirin  300 mg Rectal Daily   carbidopa-levodopa  1 tablet Oral TID   chlorhexidine gluconate (MEDLINE KIT)  15 mL Mouth Rinse BID   Chlorhexidine Gluconate Cloth  6 each Topical Q0600   cyanocobalamin  1,000 mcg Intramuscular Daily   insulin aspart  0-9 Units Subcutaneous Q4H   levothyroxine  50 mcg Intravenous Daily   mouth  rinse  15 mL Mouth Rinse 10 times per day   rotigotine  1 patch Transdermal Daily   Continuous Infusions:  cefTRIAXone (ROCEPHIN)  IV 1 g (06/22/18 1538)   dextrose 5 % and 0.45 % NaCl with KCl 20 mEq/L 75 mL/hr at 06/23/18 1100   famotidine (PEPCID) IV Stopped (06/23/18 1030)   levETIRAcetam Stopped (06/23/18 0905)   magnesium sulfate bolus IVPB     potassium PHOSPHATE IVPB (in mmol)  Procedures/Studies: Dg Abd 1 View  Result Date: 06/21/2018 CLINICAL DATA:  Check gastric catheter placement EXAM: ABDOMEN - 1 VIEW COMPARISON:  None. FINDINGS: Scattered large and small bowel gas is noted. Degenerative changes of lumbar spine are noted with scoliosis concave to the right. Check gastric catheter is noted in the mid esophagus at the superior aspect of the film. This could be advanced several cm to reach the stomach. IMPRESSION: Gastric catheter in the mid esophagus. Electronically Signed   By: Inez Catalina M.D.   On: 06/21/2018 15:47   Ct Head Wo Contrast  Result Date: 06/20/2018 CLINICAL DATA:  84 year old with altered mental status. EXAM: CT HEAD WITHOUT CONTRAST TECHNIQUE: Contiguous axial images were obtained from the base of the skull through the vertex without intravenous contrast. COMPARISON:  07/02/2018 FINDINGS: Brain: Stable cerebral atrophy. Negative for acute hemorrhage, mass lesion, midline shift, hydrocephalus or large infarct. Again noted are hypodensities in the white matter suggesting chronic changes. Focal low-density in the right frontal-parietal white matter is compatible with old ischemic changes. Patient's head rotated on this examination. Vascular: No hyperdense vessel or unexpected calcification. Skull: Normal. Negative for fracture or focal lesion. Sinuses/Orbits: No acute finding. Other: None. IMPRESSION: 1. No acute intracranial abnormality. 2. Stable atrophy and chronic white matter changes. Findings are most compatible with chronic small vessel ischemic changes.  Electronically Signed   By: Markus Daft M.D.   On: 06/20/2018 15:55   Ct Head Wo Contrast  Result Date: 06/27/2018 CLINICAL DATA:  85 year old female with a history of altered mental status EXAM: CT HEAD WITHOUT CONTRAST TECHNIQUE: Contiguous axial images were obtained from the base of the skull through the vertex without intravenous contrast. COMPARISON:  12/15/2012 FINDINGS: Brain: No acute intracranial hemorrhage. No midline shift or mass effect. Gray-white differentiation maintained. Progression of senescent volume loss. Patchy hypodensities in the periventricular white matter. Unremarkable appearance of the ventricular system. Vascular: Dense calcifications of anterior and posterior circulation. Skull: No acute fracture.  No aggressive bone lesion identified. Sinuses/Orbits: Unremarkable appearance of the orbits. Mastoid air cells clear. No middle ear effusion. No significant sinus disease. Other: Endotracheal tube in position.  Fluid in the nasopharynx IMPRESSION: Negative for acute intracranial abnormality. Senescent volume loss and evidence of chronic microvascular ischemic disease. Electronically Signed   By: Corrie Mckusick D.O.   On: 06/15/2018 14:11   Dg Chest Port 1 View  Result Date: 06/22/2018 CLINICAL DATA:  Respiratory failure. EXAM: PORTABLE CHEST 1 VIEW COMPARISON:  06/21/2018 FINDINGS: The patient is rotated to the left. A right PICC has been placed and terminates over the lower SVC. An endotracheal tube terminates just below the clavicles, unchanged. An enteric tube is suboptimally visualized though now appears to extend into the upper abdomen. The cardiomediastinal silhouette is unchanged. Lung volumes remain low without evidence of airspace consolidation, edema, pleural effusion, pneumothorax. IMPRESSION: 1. Support devices including new right PICC as above. 2. Low lung volumes without evidence of acute airspace disease. Electronically Signed   By: Logan Bores M.D.   On: 06/22/2018 08:16    Dg Chest Port 1 View  Result Date: 06/21/2018 CLINICAL DATA:  Respiratory failure. EXAM: PORTABLE CHEST 1 VIEW COMPARISON:  Radiograph of Jun 20, 2018. FINDINGS: The heart size and mediastinal contours are within normal limits. Endotracheal tube is unchanged in position. Nasogastric tube appears to be looped within the distal esophagus with tip directed back up into the more proximal esophagus. No acute pulmonary disease is noted. No pneumothorax or pleural effusion is  noted. Atherosclerosis of thoracic aorta is noted. The visualized skeletal structures are unremarkable. IMPRESSION: Endotracheal tube is unchanged in position. Nasogastric tube is looped within distal esophagus, with distal tip directed into the more proximal esophagus. No acute cardiopulmonary abnormality seen. Aortic Atherosclerosis (ICD10-I70.0). Electronically Signed   By: Marijo Conception M.D.   On: 06/21/2018 08:48   Dg Chest Port 1 View  Result Date: 06/20/2018 CLINICAL DATA:  85 year old female with repositioning of the endotracheal tube EXAM: PORTABLE CHEST 1 VIEW COMPARISON:  06/20/2018 FINDINGS: Cardiomediastinal silhouette unchanged in size and contour. No evidence of central vascular congestion. The endotracheal tube has been slightly withdrawn, now terminating 2.7 cm above the carina. There is a linear contour of the right lung of uncertain significance. There are overlying surgical lines tubes in EKG leads of the right chest somewhat obscuring evaluation. Low lung volumes. Pleuroparenchymal opacity of the right lung, may represent chronic changes and some pleural fluid. No new airspace disease. IMPRESSION: Endotracheal tube has been slightly withdrawn now terminating 2.7 cm above the carina. There is linear contour of the right lung extending in a convex configuration towards the costophrenic angle. While this is favored to represent artifact, a right pneumothorax cannot be excluded. Correlation with physical exam may be useful,  as well as a repeat chest x-ray with adequate positioning and removal of the obscuring surgical line/hardware and EKG leads. These results were discussed by telephone at the time of interpretation on 06/20/2018 at 9:21 am with the nurse caring for the patient, Ms Sherry Ruffing. Electronically Signed   By: Corrie Mckusick D.O.   On: 06/20/2018 09:21   Dg Chest Port 1 View  Result Date: 06/20/2018 CLINICAL DATA:  Unresponsive. EXAM: PORTABLE CHEST 1 VIEW COMPARISON:  Radiograph Jun 19, 2018. FINDINGS: The heart size and mediastinal contours are within normal limits. Atherosclerosis of thoracic aorta is noted. Endotracheal tube is directed into right mainstem bronchus; withdrawal by 3-4 cm is recommended. Both lungs are clear. The visualized skeletal structures are unremarkable. IMPRESSION: Endotracheal tube directed into right mainstem bronchus; withdrawal by 3-4 cm is recommended. No acute cardiopulmonary abnormality seen. These results will be called to the ordering clinician or representative by the Radiologist Assistant, and communication documented in the PACS or zVision Dashboard. Electronically Signed   By: Marijo Conception M.D.   On: 06/20/2018 08:16   Dg Chest Portable 1 View  Result Date: 07/05/2018 CLINICAL DATA:  Unresponsive. EXAM: PORTABLE CHEST 1 VIEW COMPARISON:  06/27/2010 FINDINGS: 1301 hours. Endotracheal tube tip is approximately 1.4 cm above the base of the carina. The lungs are clear without focal pneumonia, edema, pneumothorax or pleural effusion. The cardiopericardial silhouette is within normal limits for size. The visualized bony structures of the thorax are intact. Telemetry leads overlie the chest. IMPRESSION: No active disease. Electronically Signed   By: Misty Stanley M.D.   On: 06/20/2018 13:23   Dg Chest Port 1v Same Day  Result Date: 06/20/2018 CLINICAL DATA:  Intubated patient. Possible pneumothorax on plain film of the chest earlier today. EXAM: PORTABLE CHEST 1 VIEW COMPARISON:   Single-view of the chest earlier today. FINDINGS: Endotracheal tube is in place with the tip well above the carina. There is no pneumothorax. Finding described on the prior report was likely a skin fold. Lungs clear. Heart size normal. IMPRESSION: Negative for pneumothorax.  No acute disease. ETT in good position. Electronically Signed   By: Inge Rise M.D.   On: 06/20/2018 10:16    Orson Eva,  DO  [facility] Pager [phone]  If 7PM-7AM, please contact night-coverage [email or web address] Password [email or web address] 06/23/2018, 1:32 PM   LOS: 4 days

## 2018-06-23 NOTE — Progress Notes (Addendum)
Subjective: She is overall about the same.  No new problems noted.  Still unresponsive.  Eyes still deviated to the right.  She did move her tongue some when I was examining her yesterday which is the most I have seen from her thus far.  She did not respond to deep suction  Objective: Vital signs in last 24 hours: Temp:  [98.4 F (36.9 C)-99.5 F (37.5 C)] 99.1 F (37.3 C) (05/11 0730) Pulse Rate:  [31-106] 106 (05/11 0730) Resp:  [16-29] 29 (05/11 0730) BP: (106-169)/(45-85) 159/63 (05/11 0730) SpO2:  [96 %-100 %] 99 % (05/11 0730) FiO2 (%):  [30 %-40 %] 35 % (05/11 0430) Weight:  [65.3 kg] 65.3 kg (05/11 0500) Weight change:     Intake/Output from previous day: 05/10 0701 - 05/11 0700 In: 2715.2 [I.V.:1601.6; IV Piggyback:993.7] Out: 250 [Urine:250]  PHYSICAL EXAM General appearance: Intubated on mechanical ventilation Resp: rhonchi bilaterally Cardio: regular rate and rhythm, S1, S2 normal, no murmur, click, rub or gallop GI: soft, non-tender; bowel sounds normal; no masses,  no organomegaly Extremities: She has some swelling of the extremities but no pitting edema.  She has some abnormal hand positions and I am not sure if that is just because of her altered mental status or for some sort of beginning of some posturing  Lab Results:  Results for orders placed or performed during the hospital encounter of 06/28/2018 (from the past 48 hour(s))  Glucose, capillary     Status: Abnormal   Collection Time: 06/21/18 11:06 AM  Result Value Ref Range   Glucose-Capillary 120 (H) 70 - 99 mg/dL  Glucose, capillary     Status: Abnormal   Collection Time: 06/21/18  5:29 PM  Result Value Ref Range   Glucose-Capillary 143 (H) 70 - 99 mg/dL  Glucose, capillary     Status: Abnormal   Collection Time: 06/21/18  8:05 PM  Result Value Ref Range   Glucose-Capillary 145 (H) 70 - 99 mg/dL   Comment 1 Notify RN    Comment 2 Document in Chart   Glucose, capillary     Status: Abnormal    Collection Time: 06/22/18 12:02 AM  Result Value Ref Range   Glucose-Capillary 164 (H) 70 - 99 mg/dL  Blood gas, arterial     Status: Abnormal   Collection Time: 06/22/18  3:58 AM  Result Value Ref Range   FIO2 30.00    Delivery systems PRESSURE REGULATED VOLUME CONTROL    Mode VENTILATOR    VT 420 mL   LHR 16 resp/min   Peep/cpap 5.0 cm H20   pH, Arterial 7.402 7.350 - 7.450   pCO2 arterial 31.7 (L) 32.0 - 48.0 mmHg   pO2, Arterial 69.6 (L) 83.0 - 108.0 mmHg   Bicarbonate 20.9 20.0 - 28.0 mmol/L   Acid-base deficit 4.6 (H) 0.0 - 2.0 mmol/L   O2 Saturation 93.6 %   Patient temperature 37.0    Allens test (pass/fail) PASS PASS    Comment: Performed at [facility], [address], [place], [place] [address]  Glucose, capillary     Status: Abnormal   Collection Time: 06/22/18  4:21 AM  Result Value Ref Range   Glucose-Capillary 148 (H) 70 - 99 mg/dL   Comment 1 Notify RN    Comment 2 Document in Chart   Comprehensive metabolic panel     Status: Abnormal   Collection Time: 06/22/18  4:46 AM  Result Value Ref Range   Sodium 145 135 - 145 mmol/L  Potassium 3.4 (L) 3.5 - 5.1 mmol/L   Chloride 116 (H) 98 - 111 mmol/L   CO2 21 (L) 22 - 32 mmol/L   Glucose, Bld 170 (H) 70 - 99 mg/dL   BUN 18 8 - 23 mg/dL   Creatinine, Ser 0.65 0.44 - 1.00 mg/dL   Calcium 9.1 8.9 - 10.3 mg/dL   Total Protein 5.4 (L) 6.5 - 8.1 g/dL   Albumin 2.5 (L) 3.5 - 5.0 g/dL   AST 25 15 - 41 U/L   ALT 5 0 - 44 U/L   Alkaline Phosphatase 57 38 - 126 U/L   Total Bilirubin 0.7 0.3 - 1.2 mg/dL   GFR calc non Af Amer >60 >60 mL/min   GFR calc Af Amer >60 >60 mL/min   Anion gap 8 5 - 15    Comment: Performed at [facility], [address], [place], [place] [address]  CBC with Differential/Platelet     Status: Abnormal   Collection Time: 06/22/18  4:46 AM  Result Value Ref Range   WBC 6.6 4.0 - 10.5 K/uL   RBC 3.26 (L) 3.87 - 5.11 MIL/uL   Hemoglobin 9.7 (L) 12.0 - 15.0 g/dL   HCT 30.3 (L) 36.0 - 46.0 %    MCV 92.9 80.0 - 100.0 fL   MCH 29.8 26.0 - 34.0 pg   MCHC 32.0 30.0 - 36.0 g/dL   RDW 14.3 11.5 - 15.5 %   Platelets 139 (L) 150 - 400 K/uL   nRBC 0.0 0.0 - 0.2 %   Neutrophils Relative % 79 %   Neutro Abs 5.3 1.7 - 7.7 K/uL   Lymphocytes Relative 8 %   Lymphs Abs 0.6 (L) 0.7 - 4.0 K/uL   Monocytes Relative 9 %   Monocytes Absolute 0.6 0.1 - 1.0 K/uL   Eosinophils Relative 2 %   Eosinophils Absolute 0.1 0.0 - 0.5 K/uL   Basophils Relative 1 %   Basophils Absolute 0.0 0.0 - 0.1 K/uL   Immature Granulocytes 1 %   Abs Immature Granulocytes 0.04 0.00 - 0.07 K/uL    Comment: Performed at [facility], [address] [place], [place] [address]  CK     Status: Abnormal   Collection Time: 06/22/18  4:46 AM  Result Value Ref Range   Total CK 433 (H) 38 - 234 U/L    Comment: Performed at [facility], [address], [place], [place] [address]  Prealbumin     Status: Abnormal   Collection Time: 06/22/18  4:46 AM  Result Value Ref Range   Prealbumin 6.4 (L) 18 - 38 mg/dL    Comment: Performed at [facility], [address] [place], [place] [address]  Magnesium     Status: None   Collection Time: 06/22/18  4:46 AM  Result Value Ref Range   Magnesium 1.8 1.7 - 2.4 mg/dL    Comment: Performed at [facility], [address] [place], [place] [address]  Phosphorus     Status: Abnormal   Collection Time: 06/22/18  4:46 AM  Result Value Ref Range   Phosphorus 2.0 (L) 2.5 - 4.6 mg/dL    Comment: Performed at [facility], [address], [place], [place] [address]  Glucose, capillary     Status: Abnormal   Collection Time: 06/22/18  7:18 AM  Result Value Ref Range   Glucose-Capillary 149 (H) 70 - 99 mg/dL  Glucose, capillary     Status: Abnormal   Collection Time: 06/22/18 11:03 AM  Result Value Ref Range   Glucose-Capillary 171 (H) 70 - 99 mg/dL  Glucose, capillary     Status: Abnormal   Collection Time: 06/22/18  4:27 PM  Result Value Ref Range   Glucose-Capillary 183 (H) 70 -  99 mg/dL  Glucose, capillary     Status: Abnormal   Collection Time: 06/22/18  8:06 PM  Result Value Ref Range   Glucose-Capillary 172 (H) 70 - 99 mg/dL   Comment 1 Notify RN    Comment 2 Document in Chart   Glucose, capillary     Status: Abnormal   Collection Time: 06/23/18 12:16 AM  Result Value Ref Range   Glucose-Capillary 125 (H) 70 - 99 mg/dL   Comment 1 Notify RN    Comment 2 Document in Chart   Glucose, capillary     Status: Abnormal   Collection Time: 06/23/18  4:48 AM  Result Value Ref Range   Glucose-Capillary 164 (H) 70 - 99 mg/dL   Comment 1 Notify RN    Comment 2 Document in Chart   Blood gas, arterial     Status: Abnormal   Collection Time: 06/23/18  5:00 AM  Result Value Ref Range   FIO2 35.00    pH, Arterial 7.402 7.350 - 7.450   pCO2 arterial 33.8 32.0 - 48.0 mmHg   pO2, Arterial 49.0 (L) 83.0 - 108.0 mmHg   Bicarbonate 21.6 20.0 - 28.0 mmol/L   Acid-base deficit 3.4 (H) 0.0 - 2.0 mmol/L   O2 Saturation 82.9 %   Patient temperature 37.2    Allens test (pass/fail) PASS PASS    Comment: Performed at [facility], [address] [place], [place] [address]  Basic metabolic panel     Status: Abnormal   Collection Time: 06/23/18  5:06 AM  Result Value Ref Range   Sodium 139 135 - 145 mmol/L   Potassium 4.1 3.5 - 5.1 mmol/L    Comment: DELTA CHECK NOTED   Chloride 112 (H) 98 - 111 mmol/L   CO2 20 (L) 22 - 32 mmol/L   Glucose, Bld 187 (H) 70 - 99 mg/dL   BUN 15 8 - 23 mg/dL   Creatinine, Ser 0.58 0.44 - 1.00 mg/dL   Calcium 8.7 (L) 8.9 - 10.3 mg/dL   GFR calc non Af Amer >60 >60 mL/min   GFR calc Af Amer >60 >60 mL/min   Anion gap 7 5 - 15    Comment: Performed at [facility], [address] [place], [place] [address]  Magnesium     Status: Abnormal   Collection Time: 06/23/18  5:06 AM  Result Value Ref Range   Magnesium 1.5 (L) 1.7 - 2.4 mg/dL    Comment: Performed at [facility], [address], [place], [place] [address]  Phosphorus     Status:  Abnormal   Collection Time: 06/23/18  5:06 AM  Result Value Ref Range   Phosphorus 2.4 (L) 2.5 - 4.6 mg/dL    Comment: Performed at [facility], [address], [place], [place] [address]  CBC with Differential/Platelet     Status: Abnormal   Collection Time: 06/23/18  5:06 AM  Result Value Ref Range   WBC 7.4 4.0 - 10.5 K/uL   RBC 3.39 (L) 3.87 - 5.11 MIL/uL   Hemoglobin 9.7 (L) 12.0 - 15.0 g/dL   HCT 31.7 (L) 36.0 - 46.0 %   MCV 93.5 80.0 - 100.0 fL   MCH 28.6 26.0 - 34.0 pg   MCHC 30.6  30.0 - 36.0 g/dL   RDW 14.0 11.5 - 15.5 %   Platelets 140 (L) 150 - 400 K/uL   nRBC 0.0 0.0 - 0.2 %   Neutrophils Relative % 81 %   Neutro Abs 6.0 1.7 - 7.7 K/uL   Lymphocytes Relative 7 %   Lymphs Abs 0.5 (L) 0.7 - 4.0 K/uL   Monocytes Relative 9 %   Monocytes Absolute 0.7 0.1 - 1.0 K/uL   Eosinophils Relative 2 %   Eosinophils Absolute 0.2 0.0 - 0.5 K/uL   Basophils Relative 1 %   Basophils Absolute 0.0 0.0 - 0.1 K/uL   Immature Granulocytes 0 %   Abs Immature Granulocytes 0.03 0.00 - 0.07 K/uL    Comment: Performed at [facility], [address] [place], [place] [address]    ABGS Recent Labs    06/23/18 0500  PHART 7.402  PO2ART 49.0*  HCO3 21.6   CULTURES Recent Results (from the past 240 hour(s))  SARS Coronavirus 2 (CEPHEID - Performed in [place] hospital lab), Hosp Order     Status: None   Collection Time: 07/08/2018 12:15 PM  Result Value Ref Range Status   SARS Coronavirus 2 NEGATIVE NEGATIVE Final    Comment: (NOTE) If result is NEGATIVE SARS-CoV-2 target nucleic acids are NOT DETECTED. The SARS-CoV-2 RNA is generally detectable in upper and lower  respiratory specimens during the acute phase of infection. The lowest  concentration of SARS-CoV-2 viral copies this assay can detect is 250  copies / mL. A negative result does not preclude SARS-CoV-2 infection  and should not be used as the sole basis for treatment or other  patient management decisions.  A negative  result may occur with  improper specimen collection / handling, submission of specimen other  than nasopharyngeal swab, presence of viral mutation(s) within the  areas targeted by this assay, and inadequate number of viral copies  (<250 copies / mL). A negative result must be combined with clinical  observations, patient history, and epidemiological information. If result is POSITIVE SARS-CoV-2 target nucleic acids are DETECTED. The SARS-CoV-2 RNA is generally detectable in upper and lower  respiratory specimens dur ing the acute phase of infection.  Positive  results are indicative of active infection with SARS-CoV-2.  Clinical  correlation with patient history and other diagnostic information is  necessary to determine patient infection status.  Positive results do  not rule out bacterial infection or co-infection with other viruses. If result is PRESUMPTIVE POSTIVE SARS-CoV-2 nucleic acids MAY BE PRESENT.   A presumptive positive result was obtained on the submitted specimen  and confirmed on repeat testing.  While 2019 novel coronavirus  (SARS-CoV-2) nucleic acids may be present in the submitted sample  additional confirmatory testing may be necessary for epidemiological  and / or clinical management purposes  to differentiate between  SARS-CoV-2 and other Sarbecovirus currently known to infect humans.  If clinically indicated additional testing with an alternate test  methodology [phone] is advised. The SARS-CoV-2 RNA is generally  detectable in upper and lower respiratory sp ecimens during the acute  phase of infection. The expected result is Negative. Fact Sheet for Patients:  StrictlyIdeas.no Fact Sheet for Healthcare Providers: BankingDealers.co.za This test is not yet approved or cleared by the [place] FDA and has been authorized for detection and/or diagnosis of SARS-CoV-2 by FDA under an Emergency Use Authorization  (EUA).  This EUA will remain in effect (meaning this test can be used) for the duration of the  COVID-19 declaration under Section 564(b)(1) of the Act, 21 U.S.C. section 360bbb-3(b)(1), unless the authorization is terminated or revoked sooner. Performed at [facility], [address] [place], [place] [address]   Urine culture     Status: Abnormal   Collection Time: 07/12/2018  2:38 PM  Result Value Ref Range Status   Specimen Description   Final    URINE, CLEAN CATCH Performed at [facility], [address] [place], [place] [address]    Special Requests   Final    NONE Performed at [facility], [address], [place], [place] [address]    Culture 90,000 COLONIES/mL ESCHERICHIA COLI (A)  Final   Report Status 06/21/2018 FINAL  Final   Organism ID, Bacteria ESCHERICHIA COLI (A)  Final      Susceptibility   Escherichia coli - MIC*    AMPICILLIN 4 SENSITIVE Sensitive     CEFAZOLIN <=4 SENSITIVE Sensitive     CEFTRIAXONE <=1 SENSITIVE Sensitive     CIPROFLOXACIN >=4 RESISTANT Resistant     GENTAMICIN <=1 SENSITIVE Sensitive     IMIPENEM <=0.25 SENSITIVE Sensitive     NITROFURANTOIN <=16 SENSITIVE Sensitive     TRIMETH/SULFA <=20 SENSITIVE Sensitive     AMPICILLIN/SULBACTAM <=2 SENSITIVE Sensitive     PIP/TAZO <=4 SENSITIVE Sensitive     Extended ESBL NEGATIVE Sensitive     * 90,000 COLONIES/mL ESCHERICHIA COLI  MRSA PCR Screening     Status: None   Collection Time: 06/18/2018  7:13 PM  Result Value Ref Range Status   MRSA by PCR NEGATIVE NEGATIVE Final    Comment:        The GeneXpert MRSA Assay (FDA approved for NASAL specimens only), is one component of a comprehensive MRSA colonization surveillance program. It is not intended to diagnose MRSA infection nor to guide or monitor treatment for MRSA infections. Performed at [facility], [address], [place], [place] [address]   Culture, blood (routine x 2)     Status: None (Preliminary result)   Collection Time:  06/18/2018  9:04 PM  Result Value Ref Range Status   Specimen Description BLOOD LEFT HAND  Final   Special Requests   Final    BOTTLES DRAWN AEROBIC AND ANAEROBIC Blood Culture adequate volume   Culture   Final    NO GROWTH 4 DAYS Performed at [facility], [address] [place], [place] [address]    Report Status PENDING  Incomplete  Culture, blood (routine x 2)     Status: None (Preliminary result)   Collection Time: 06/22/2018  9:05 PM  Result Value Ref Range Status   Specimen Description BLOOD RIGHT HAND  Final   Special Requests   Final    BOTTLES DRAWN AEROBIC AND ANAEROBIC Blood Culture adequate volume   Culture   Final    NO GROWTH 4 DAYS Performed at [facility], [address], [place],  [address]    Report Status PENDING  Incomplete   Studies/Results: Dg Abd 1 View  Result Date: 06/21/2018 CLINICAL DATA:  Check gastric catheter placement EXAM: ABDOMEN - 1 VIEW COMPARISON:  None. FINDINGS: Scattered large and small bowel gas is noted. Degenerative changes of lumbar spine are noted with scoliosis concave to the right. Check gastric catheter is noted in the mid esophagus at the superior aspect of the film. This could be advanced several cm to reach the stomach. IMPRESSION: Gastric catheter in the mid esophagus. Electronically Signed   By: Linus Mako.D.  On: 06/21/2018 15:47   Dg Chest Port 1 View  Result Date: 06/22/2018 CLINICAL DATA:  Respiratory failure. EXAM: PORTABLE CHEST 1 VIEW COMPARISON:  06/21/2018 FINDINGS: The patient is rotated to the left. A right PICC has been placed and terminates over the lower SVC. An endotracheal tube terminates just below the clavicles, unchanged. An enteric tube is suboptimally visualized though now appears to extend into the upper abdomen. The cardiomediastinal silhouette is unchanged. Lung volumes remain low without evidence of airspace consolidation, edema, pleural effusion, pneumothorax. IMPRESSION: 1. Support devices including  new right PICC as above. 2. Low lung volumes without evidence of acute airspace disease. Electronically Signed   By: Logan Bores M.D.   On: 06/22/2018 08:16    Medications:  Prior to Admission:  Medications Prior to Admission  Medication Sig Dispense Refill Last Dose  . acetaminophen (TYLENOL) 650 MG CR tablet Take 650 mg by mouth 2 (two) times daily as needed for pain.   Taking  . carbidopa-levodopa (SINEMET IR) 10-100 MG tablet Take 1 Table tby mouth 3 Times Daily As Directed for Parkinson's Disease   Taking  . clopidogrel (PLAVIX) 75 MG tablet Take 75 mg by mouth at bedtime.     at unknown  . furosemide (LASIX) 40 MG tablet 40 mg. Take 1/2tablet by mouth twice daily   Taking  . glipiZIDE (GLUCOTROL XL) 10 MG 24 hr tablet TAKE  (1)  TABLET BY MOUTH TWICE A DAY.   Taking  . levothyroxine (SYNTHROID) 100 MCG tablet Take 1 tablet by mouth daily.     Marland Kitchen linagliptin (TRADJENTA) 5 MG TABS tablet Take 5 mg by mouth daily.    Taking  . lisinopril (ZESTRIL) 20 MG tablet Take 1 tablet by mouth daily.    at unknown  . pramipexole (MIRAPEX) 0.75 MG tablet Take 0.75 mg by mouth 3 (three) times daily.   Taking  . pravastatin (PRAVACHOL) 40 MG tablet Take 1 tablet by mouth daily.   Taking  . pregabalin (LYRICA) 100 MG capsule Take 100 mg by mouth 3 (three) times daily.    Taking  . selegiline (ELDEPRYL) 5 MG capsule Take 5 mg by mouth 2 (two) times daily.    Taking  . Vitamin D, Ergocalciferol, (DRISDOL) 50000 UNITS CAPS capsule Take 1 capsule by mouth once a week.   Taking  . tiZANidine (ZANAFLEX) 4 MG tablet 1/2 or 1 Tablet by mout Up To 4 Times Per Day for Muscle Spasms. May Cause Drowsiness   Taking   Scheduled: . aspirin  300 mg Rectal Daily  . carbidopa-levodopa  1 tablet Oral TID  . chlorhexidine gluconate (MEDLINE KIT)  15 mL Mouth Rinse BID  . Chlorhexidine Gluconate Cloth  6 each Topical Q0600  . cyanocobalamin  1,000 mcg Intramuscular Daily  . insulin aspart  0-9 Units Subcutaneous Q4H  .  levothyroxine  50 mcg Intravenous Daily  . mouth rinse  15 mL Mouth Rinse 10 times per day  . pramipexole  0.75 mg Oral TID   Continuous: . cefTRIAXone (ROCEPHIN)  IV 1 g (06/22/18 1538)  . dextrose 5 % and 0.45 % NaCl with KCl 20 mEq/L 75 mL/hr at 06/22/18 1904  . famotidine (PEPCID) IV 20 mg (06/22/18 1019)  . levETIRAcetam 500 mg (06/22/18 2023)   WPY:[id number], albuterol, fentaNYL (SUBLIMAZE) injection, fentaNYL (SUBLIMAZE) injection, ondansetron (ZOFRAN) IV  Assesment: She was admitted with acute encephalopathy and unresponsive.  She had acute hypoxic respiratory failure and she was intubated for airway protection.  It is unclear how long she was down.  On the day before she was brought to the hospital family had checked on her and she was sleepy but was able to talk to them.  When they went back the next day she was unresponsive.  She has remained on the ventilator since admission.  She has reasonable ability to breathe but I do not think she can protect her airway  She has Parkinson's disease at baseline and we had significant difficulty getting her medications into her because of her problems with placing a feeding tube Agree we will probably need to start TPN  She has rhabdomyolysis which is getting better  She has a urinary tract infection which is being treated Active Problems:   Lower urinary tract infectious disease   Unresponsiveness   Acute encephalopathy   Acute respiratory failure with hypoxia ([place])   Rhabdomyolysis    Plan: Continue treatments.  Hopefully we can clarify prognosis.  It is okay for her to breathe on her own but I do not think it is going to be safe to remove the endotracheal tube    LOS: 4 days   Alonza Bogus 06/23/2018, 8:04 AM

## 2018-06-23 NOTE — Consult Note (Signed)
Consultation Note Date: 06/23/2018   Patient Name: Jennifer [name]  DOB: 17-Aug-1930  MRN: [id number]  Age / Sex: 85 y.o., female  PCP: [name], MD Referring Physician: Orson Eva, MD  Reason for Consultation: Establishing goals of care  HPI/Patient Profile: 85 y.o. female  with past medical history of Parkinson's disease, HTN, HLD, PSVT, TIA, h/o tobacco abuse, diabetes, hypothyroid admitted on 07/08/2018 with unresponsiveness and last seen normal 2 days PTA requiring intubation. No improvement in neurological status since admission.   Clinical Assessment and Goals of Care: Jennifer [name] continues to be unresponsive on ventilator. I discussed with RN and Dr. Carles Collet as well as reviewed records including outpatient neurology notes. Per notes I see that Jennifer [name]'s husband of 18 years died in 01-05-18. Ms. [name] reportedly relied heavily on him as a support.   I called and spoke with daughter, [name], along with Jennifer [name]'s daughter. They have been in communication with Dr. Carles Collet daily and are very appreciative of his updates. They have not yet spoken with him today. [name] tells me that her mother is very independent and she cannot be sure about her mother's compliance with medications. This has been an issue previously and they had her medications placed in pill pockets hoping for better compliance. They are very hopeful for neurological improvement once she is receiving her Parkinson's medication more consistently. We discussed plans for IR NGT/OGT placement (this has not happened yet) and transdermal delivery of Parkinson's regimen.   They had many questions regarding ventilator and respiratory status and I spent some time explaining that Ms. Cowie is not necessarily requiring a lot of support via ventilator as the main issue is not respiratory status but protection of airway. We discussed how a certain level of  neurological and brain function is required to maintain effective and safe breathing.   They are very appreciative of the updates from staff here. They were quick to get off the phone once questions answered. I will let this go today and use today as time to build rapport. I will reach back to them tomorrow to further discuss [place] especially in the event we do not see the improvement they are hoping.   Primary Decision Maker NEXT OF KIN daughter    SUMMARY OF RECOMMENDATIONS   - Will need to continue [place] conversation with family  Code Status/Advance Care Planning:  Full code   Symptom Management:   Per attending.   Palliative Prophylaxis:   Delirium Protocol, Oral Care and Turn Reposition  Additional Recommendations (Limitations, Scope, Preferences):  Full Scope Treatment  Psycho-social/Spiritual:   Desire for further Chaplaincy support:yes  Additional Recommendations: Caregiving  Support/Resources  Prognosis:   Unable to determine  Discharge Planning: To Be Determined      Primary Diagnoses: Present on Admission: . Unresponsiveness . Lower urinary tract infectious disease   I have reviewed the medical record, interviewed the patient and family, and examined the patient. The following aspects are pertinent.  Past Medical History:  Diagnosis Date  . Anemia  Normocytic; with thrombocytopenia  . Arteriosclerotic cardiovascular disease (ASCVD) 1994   1994-PCI to RCA and LAD; nonsustained ventricular tachycardia;cath in 11/06-30% left main, 50% proximal RCA, normal EF; Echo in 06/2010-mild LVH, hyperdynamic LV function  . Diabetes mellitus, type 2 ([place])   . Gastroesophageal reflux disease    Dysmotility, hiatal hernia; Schatzki's ring-dilated in 02/2010  . Hyperlipidemia   . Hypertension   . Hypothyroid    Hyperthyroidism secondary to excessive replacement  . Nephrolithiasis   . Obesity   . Parkinson disease ([place])   . PSVT (paroxysmal supraventricular  tachycardia) ([place]) 2006   S/p RFA-2006  . TIA (transient ischemic attack)   . Tobacco abuse, in remission    50 pack years  . UTI (lower urinary tract infection)    Social History   Socioeconomic History  . Marital status: Married    Spouse name: Not on file  . Number of children: Not on file  . Years of education: Not on file  . Highest education level: Not on file  Occupational History  . Occupation: Retired    Comment: Systems analyst  Social Needs  . Financial resource strain: Not on file  . Food insecurity:    Worry: Not on file    Inability: Not on file  . Transportation needs:    Medical: Not on file    Non-medical: Not on file  Tobacco Use  . Smoking status: Former Smoker    Packs/day: 1.00    Years: 50.00    Pack years: 50.00    Types: Cigarettes    Last attempt to quit: 05/14/2008    Years since quitting: 10.1  . Smokeless tobacco: Never Used  . Tobacco comment: Patient smokes 1 to 1 1/2 a day  Substance and Sexual Activity  . Alcohol use: No    Alcohol/week: 0.0 standard drinks  . Drug use: No  . Sexual activity: Yes    Partners: Male  Lifestyle  . Physical activity:    Days per week: Not on file    Minutes per session: Not on file  . Stress: Not on file  Relationships  . Social connections:    Talks on phone: Not on file    Gets together: Not on file    Attends religious service: Not on file    Active member of club or organization: Not on file    Attends meetings of clubs or organizations: Not on file    Relationship status: Not on file  Other Topics Concern  . Not on file  Social History Narrative  . Not on file   Family History  Problem Relation Age of Onset  . Coronary artery disease Father   . Stroke Father   . Coronary artery disease Mother        also a sibling  . Heart disease Mother   . Heart disease Sister   . Heart disease Sister   . Heart disease Sister   . Heart disease Sister   . Heart disease Sister   . Heart disease  Sister   . Heart disease Brother   . Heart disease Brother   . Heart disease Brother   . Heart disease Brother   . Heart disease Brother   . Healthy Daughter    Scheduled Meds: . aspirin  300 mg Rectal Daily  . carbidopa-levodopa  1 tablet Oral TID  . chlorhexidine gluconate (MEDLINE KIT)  15 mL Mouth Rinse BID  . Chlorhexidine Gluconate Cloth  6 each Topical  Q7998  . cyanocobalamin  1,000 mcg Intramuscular Daily  . insulin aspart  0-9 Units Subcutaneous Q4H  . levothyroxine  50 mcg Intravenous Daily  . mouth rinse  15 mL Mouth Rinse 10 times per day  . rotigotine  1 patch Transdermal Daily   Continuous Infusions: . cefTRIAXone (ROCEPHIN)  IV 1 g (06/23/18 1417)  . dextrose 5 % and 0.45 % NaCl with KCl 20 mEq/L 75 mL/hr at 06/23/18 1100  . famotidine (PEPCID) IV Stopped (06/23/18 1030)  . levETIRAcetam Stopped (06/23/18 0905)  . magnesium sulfate bolus IVPB 2 g (06/23/18 1418)  . potassium PHOSPHATE IVPB (in mmol)     PRN Meds:.acetaminophen, albuterol, fentaNYL (SUBLIMAZE) injection, fentaNYL (SUBLIMAZE) injection, ondansetron (ZOFRAN) IV Allergies  Allergen Reactions  . Ace Inhibitors Other (See Comments)    Hyperkalemia-mild   . Codeine Nausea And Vomiting  . Penicillins Swelling  . Ropinirole Hcl Other (See Comments)    Pt wasn't in right state of mind   Review of Systems  Unable to perform ROS: Acuity of condition    Physical Exam Vitals signs and nursing note reviewed.  Constitutional:      General: She is not in acute distress.    Appearance: She is ill-appearing.     Interventions: She is intubated.  Cardiovascular:     Rate and Rhythm: Normal rate.  Pulmonary:     Effort: No tachypnea or accessory muscle usage. She is intubated.  Abdominal:     Palpations: Abdomen is soft.  Neurological:     Mental Status: She is unresponsive.     Comments: Posturing      Vital Signs: BP (!) 106/45   Pulse 94   Temp 99.1 F (37.3 C)   Resp (!) 25   Ht _0   (1.6 m)   Wt 65.3 kg   SpO2 99%   BMI 25.50 kg/m  Pain Scale: CPOT POSS *See Group Information*: 1-Acceptable,Awake and alert     SpO2: SpO2: 99 % O2 Device:SpO2: 99 % O2 Flow Rate: .   IO: Intake/output summary:   Intake/Output Summary (Last 24 hours) at 06/23/2018 1431 Last data filed at 06/23/2018 1100 Gross per 24 hour  Intake 2404.22 ml  Output 250 ml  Net 2154.22 ml    LBM:   Baseline Weight: Weight: 60.4 kg Most recent weight: Weight: 65.3 kg     Palliative Assessment/Data:     Time In/Out: 1430-1450, 1520-1550 Time Total: 50 min Greater than 50%  of this time was spent counseling and coordinating care related to the above assessment and plan.  Signed by: Vinie Sill, NP Palliative Medicine Team Pager # [phone] (M-F 8a-5p) Team Phone # [phone] (Nights/Weekends)

## 2018-06-23 NOTE — Progress Notes (Signed)
Pt taken to xray on ventilator. Pt returned to full support ventilation. No issues with transport to and from xray

## 2018-06-23 NOTE — Progress Notes (Signed)
Have increased patient back to FIO2 40. Not sure if morning Blood gas was correct ? May have been mixed venous.

## 2018-06-23 NOTE — Progress Notes (Signed)
Nutrition Follow up-   DOCUMENTATION CODES:     INTERVENTION:   If tube is successfully placed recommend:  Start Osmolite 1.5 @ 40 ml /hr -provides 1440 kcal, 60 gr protein and 731 ml water. Add ProStat 30 ml daily via tube to meet est protein goal.    NUTRITION DIAGNOSIS:   Inadequate oral intake related to inability to eat as evidenced by estimated needs, NPO and mechanically ventilated- day 4 .   GOAL:   Provide needs based on ASPEN/SCCM guidelines  MONITOR:   Vent status  REASON FOR ASSESSMENT:   Ventilator    ASSESSMENT: Patient has been intubated for airway protection after being found at home by her granddaughter in an unresponsive state. Acute encephalopathy. Patient history includes: HTN, DM-2, Parkinson's, TIA, GERD and UTI's.   Multiple attempts to place NG/OG unsuccessful and radiologist not available to assist until Monday. Discussed pt with RN. -EEG results-   5/11-F/U -pt remains unresponsive and intubated ( d4). Consult for New TNA received. However, it would be beneficial for patient to feed enterally if at all possible. IR has been consulted by MD for tube placement. Communicated with pharmacy and MD. RD will d/c TNA consult for now. Please re-consult if needed. Tube feeding recommendations placed above.  MV: 8.9  L/min Temp (24hrs), Avg:99 F (37.2 C), Min:98.4 F (36.9 C), Max:99.5 F (37.5 C) Sedative-fentynl  Patient weighed 77 kg last year in early April. Unclear at this time whether her weight loss has been gradual- but she is down 22% compared to 13 months ago which is clinically relevant.  Medications reviewed and include: vitamin B-12, SSI, Sinemet, levothyroxine, Pepcid, Keppra, Rocephin  IVF- D5/ 0.45% NS w/KCL 46mEq/l  @ 75 ml /hr   Intake/Output Summary (Last 24 hours) at 06/23/2018 1259 Last data filed at 06/23/2018 1100 Gross per 24 hour  Intake 2999.21 ml  Output 250 ml  Net 2749.21 ml     Labs: BMP Latest Ref Rng & Units  06/23/2018 06/22/2018 06/21/2018  Glucose 70 - 99 mg/dL 161(W) 960(A) 540(J)  BUN 8 - 23 mg/dL 15 18 81(X)  Creatinine 0.44 - 1.00 mg/dL 9.14 7.82 9.56  Sodium 135 - 145 mmol/L 139 145 147(H)  Potassium 3.5 - 5.1 mmol/L 4.1 3.4(L) 3.8  Chloride 98 - 111 mmol/L 112(H) 116(H) 116(H)  CO2 22 - 32 mmol/L 20(L) 21(L) 15(L)  Calcium 8.9 - 10.3 mg/dL 2.1(H) 9.1 0.8(M)     NUTRITION - FOCUSED PHYSICAL EXAM:  Unable to complete Nutrition-Focused physical exam at this time.    Diet Order:   Diet Order    None      EDUCATION NEEDS:   Not appropriate for education at this time   Skin:  Skin Assessment: Reviewed RN Assessment  Last BM:  unknown  Height:   Ht Readings from Last 1 Encounters:  06/23/18 5\' 3"  (1.6 m)    Weight:   Wt Readings from Last 1 Encounters:  06/23/18 65.3 kg  5/8 -60.4 kg   Ideal Body Weight:  52 kg  BMI:  Body mass index is 25.5 kg/m.  Estimated Nutritional Needs:   Kcal:  1446   Protein:  68-78 (1.3-1.5 gr/kg/bw)  Fluid:  per MD  Royann Shivers MS,RD,CSG,LDN Office: [phone] Pager: [phone]

## 2018-06-24 ENCOUNTER — Inpatient Hospital Stay (HOSPITAL_COMMUNITY): Payer: Medicare Other

## 2018-06-24 DIAGNOSIS — N3001 Acute cystitis with hematuria: Secondary | ICD-10-CM

## 2018-06-24 DIAGNOSIS — J9600 Acute respiratory failure, unspecified whether with hypoxia or hypercapnia: Secondary | ICD-10-CM

## 2018-06-24 LAB — COMPREHENSIVE METABOLIC PANEL
ALT: 9 U/L (ref 0–44)
AST: 25 U/L (ref 15–41)
Albumin: 2.1 g/dL — ABNORMAL LOW (ref 3.5–5.0)
Alkaline Phosphatase: 59 U/L (ref 38–126)
Anion gap: 7 (ref 5–15)
BUN: 15 mg/dL (ref 8–23)
CO2: 19 mmol/L — ABNORMAL LOW (ref 22–32)
Calcium: 8.2 mg/dL — ABNORMAL LOW (ref 8.9–10.3)
Chloride: 109 mmol/L (ref 98–111)
Creatinine, Ser: 0.71 mg/dL (ref 0.44–1.00)
GFR calc Af Amer: >60 mL/min (ref >60)
GFR calc non Af Amer: >60 mL/min (ref >60)
Glucose, Bld: 261 mg/dL — ABNORMAL HIGH (ref 70–99)
Potassium: 4.2 mmol/L (ref 3.5–5.1)
Sodium: 135 mmol/L (ref 135–145)
Total Bilirubin: 0.4 mg/dL (ref 0.3–1.2)
Total Protein: 4.9 g/dL — ABNORMAL LOW (ref 6.5–8.1)

## 2018-06-24 LAB — CBC
HCT: 28.5 % — ABNORMAL LOW (ref 36.0–46.0)
Hemoglobin: 9 g/dL — ABNORMAL LOW (ref 12.0–15.0)
MCH: 29.5 pg (ref 26.0–34.0)
MCHC: 31.6 g/dL (ref 30.0–36.0)
MCV: 93.4 fL (ref 80.0–100.0)
Platelets: 145 10*3/uL — ABNORMAL LOW (ref 150–400)
RBC: 3.05 MIL/uL — ABNORMAL LOW (ref 3.87–5.11)
RDW: 14.2 % (ref 11.5–15.5)
WBC: 6.9 10*3/uL (ref 4.0–10.5)
nRBC: 0 % (ref 0.0–0.2)

## 2018-06-24 LAB — GLUCOSE, CAPILLARY
Glucose-Capillary: 200 mg/dL — ABNORMAL HIGH (ref 70–99)
Glucose-Capillary: 210 mg/dL — ABNORMAL HIGH (ref 70–99)
Glucose-Capillary: 232 mg/dL — ABNORMAL HIGH (ref 70–99)
Glucose-Capillary: 235 mg/dL — ABNORMAL HIGH (ref 70–99)
Glucose-Capillary: 249 mg/dL — ABNORMAL HIGH (ref 70–99)
Glucose-Capillary: 258 mg/dL — ABNORMAL HIGH (ref 70–99)
Glucose-Capillary: 300 mg/dL — ABNORMAL HIGH (ref 70–99)

## 2018-06-24 LAB — CULTURE, BLOOD (ROUTINE X 2)
Culture: NO GROWTH
Culture: NO GROWTH
Special Requests: ADEQUATE
Special Requests: ADEQUATE

## 2018-06-24 LAB — URINALYSIS, COMPLETE (UACMP) WITH MICROSCOPIC
Bacteria, UA: NONE SEEN
Bilirubin Urine: NEGATIVE
Glucose, UA: 150 mg/dL — AB
Ketones, ur: NEGATIVE mg/dL
Nitrite: NEGATIVE
Protein, ur: NEGATIVE mg/dL
Specific Gravity, Urine: 1.012 (ref 1.005–1.030)
WBC, UA: >50 WBC/hpf — ABNORMAL HIGH (ref 0–5)
pH: 5 (ref 5.0–8.0)

## 2018-06-24 LAB — BLOOD GAS, ARTERIAL
Acid-base deficit: 5.7 mmol/L — ABNORMAL HIGH (ref 0.0–2.0)
Bicarbonate: 19.7 mmol/L — ABNORMAL LOW (ref 20.0–28.0)
FIO2: 55
O2 Saturation: 90.3 %
Patient temperature: 37
pCO2 arterial: 36.2 mmHg (ref 32.0–48.0)
pH, Arterial: 7.34 — ABNORMAL LOW (ref 7.350–7.450)
pO2, Arterial: 67 mmHg — ABNORMAL LOW (ref 83.0–108.0)

## 2018-06-24 LAB — PROCALCITONIN: Procalcitonin: <0.1 ng/mL

## 2018-06-24 LAB — CK: Total CK: 414 U/L — ABNORMAL HIGH (ref 38–234)

## 2018-06-24 MED ORDER — SODIUM CHLORIDE 0.9 % IV SOLN
2.0000 g | Freq: Two times a day (BID) | INTRAVENOUS | Status: DC
  Administered 2018-06-25 – 2018-06-27 (×7): 2 g via INTRAVENOUS
  Filled 2018-06-24 (×7): qty 2

## 2018-06-24 MED ORDER — FUROSEMIDE 10 MG/ML IJ SOLN
40.0000 mg | Freq: Once | INTRAMUSCULAR | Status: AC
  Administered 2018-06-24: 40 mg via INTRAVENOUS
  Filled 2018-06-24: qty 4

## 2018-06-24 MED ORDER — METRONIDAZOLE IN NACL 5-0.79 MG/ML-% IV SOLN
500.0000 mg | Freq: Three times a day (TID) | INTRAVENOUS | Status: DC
  Administered 2018-06-24 – 2018-06-28 (×11): 500 mg via INTRAVENOUS
  Filled 2018-06-24 (×11): qty 100

## 2018-06-24 MED ORDER — CARBIDOPA-LEVODOPA 25-100 MG PO TABS
2.0000 | ORAL_TABLET | Freq: Three times a day (TID) | ORAL | Status: DC
  Administered 2018-06-24 – 2018-06-27 (×10): 2 via ORAL
  Filled 2018-06-24 (×9): qty 2

## 2018-06-24 MED ORDER — METOCLOPRAMIDE HCL 5 MG/ML IJ SOLN
5.0000 mg | Freq: Three times a day (TID) | INTRAMUSCULAR | Status: DC
  Administered 2018-06-24 – 2018-06-26 (×6): 5 mg via INTRAVENOUS
  Filled 2018-06-24 (×6): qty 2

## 2018-06-24 NOTE — Progress Notes (Signed)
Palliative:  Jennifer [name] continues on vent and still only response on my exam is grimace to painful stimuli. Does not follow any commands.   I spoke further with her daughter, Jennifer [name], via telephone. Updated Jennifer [name] on status. We were able to speak about consideration of GOC and decisions. Jennifer [name] maintains hope that her mother will improve with Parkinson's medications restarted. Jennifer [name] tells me that she has had conversations with Dr. Arbutus Leas regarding the potential of having to make difficult decisions to take her mother off life support and for EOL. However, Jennifer [name] tells me that they are hoping "it doesn't come to that." I recommended consideration of DNR in case of any further decline as this would be a poorer prognostic factor for any further improvement. Jennifer [name] is hesitant to make any decisions and again is hoping it does not come to them having to make these decisions. All questions/concerns addressed. Emotional support provided. Will continue to follow and support.   Exam: Grimace to painful stimuli. No follow commands. No distress.   Plan: - Family remains hopeful. Will continue to follow and support family and continue GOC conversations.   25 min  Yong Channel, NP Palliative Medicine Team Pager # [phone] (M-F 8a-5p) Team Phone # [phone] (Nights/Weekends)

## 2018-06-24 NOTE — Progress Notes (Signed)
Inpatient Diabetes Program Recommendations  AACE/ADA: New Consensus Statement on Inpatient Glycemic Control (2015)  Target Ranges:  Prepandial:   less than 140 mg/dL      Peak postprandial:   less than 180 mg/dL (1-2 hours)      Critically ill patients:  140 - 180 mg/dL   Results for Jennifer [name], Jennifer [name] (MRN [id number]) as of 06/24/2018 10:35  Ref. Range 06/23/2018 08:09 06/23/2018 11:28 06/23/2018 16:20 06/23/2018 19:48 06/24/2018 00:25 06/24/2018 04:22 06/24/2018 07:54  Glucose-Capillary Latest Ref Range: 70 - 99 mg/dL 403 (H) 474 (H) 76 259 (H) 300 (H) 232 (H) 200 (H)    Review of Glycemic Control  Diabetes history: DM 2 Outpatient Diabetes medications: Glipizide 10 mg BID, Tradjenta 5 mg Daily  Current orders for Inpatient glycemic control: Novolog 0-9 units Q4 hours A1c 6.2% on 5/7   Inpatient Diabetes Program Recommendations:    Glucose increased after initiation of tube feeds. Consider Novolog 2-3 units Q4 hours tube feed coverage (do not give if tube feeds are stopped or held for any reason).  Thanks,  Christena Deem RN, MSN, BC-ADM Inpatient Diabetes Coordinator Team Pager [phone] (8a-5p)

## 2018-06-24 NOTE — Progress Notes (Signed)
Subjective: She is overall about the same.  She remains intubated and on the ventilator.  She is being seen by neurology and by palliative care.  Plan at this point is to continue full therapy  Objective: Vital signs in last 24 hours: Temp:  [98.8 F (37.1 C)-100.4 F (38 C)] 99.5 F (37.5 C) (05/12 0630) Pulse Rate:  [80-117] 87 (05/12 0630) Resp:  [11-32] 21 (05/12 0630) BP: (98-172)/(37-72) 108/46 (05/12 0630) SpO2:  [87 %-100 %] 100 % (05/12 0728) FiO2 (%):  [40 %-55 %] 55 % (05/12 0728) Weight:  [67.4 kg] 67.4 kg (05/12 0500) Weight change: 2.1 kg    Intake/Output from previous day: 05/11 0701 - 05/12 0700 In: 2574.2 [I.V.:1955.2; NG/GT:30; IV Piggyback:589] Out: 500 [Urine:500]  PHYSICAL EXAM General appearance: Intubated sedated on mechanical ventilation Resp: rhonchi bilaterally Cardio: regular rate and rhythm, S1, S2 normal, no murmur, click, rub or gallop GI: soft, non-tender; bowel sounds normal; no masses,  no organomegaly Extremities: She still has some generalized puffiness of the extremities She no longer has deviation of her eyes to the right Lab Results:  Results for orders placed or performed during the hospital encounter of 07/05/2018 (from the past 48 hour(s))  Glucose, capillary     Status: Abnormal   Collection Time: 06/22/18 11:03 AM  Result Value Ref Range   Glucose-Capillary 171 (H) 70 - 99 mg/dL  Glucose, capillary     Status: Abnormal   Collection Time: 06/22/18  4:27 PM  Result Value Ref Range   Glucose-Capillary 183 (H) 70 - 99 mg/dL  Glucose, capillary     Status: Abnormal   Collection Time: 06/22/18  8:06 PM  Result Value Ref Range   Glucose-Capillary 172 (H) 70 - 99 mg/dL   Comment 1 Notify RN    Comment 2 Document in Chart   Glucose, capillary     Status: Abnormal   Collection Time: 06/23/18 12:16 AM  Result Value Ref Range   Glucose-Capillary 125 (H) 70 - 99 mg/dL   Comment 1 Notify RN    Comment 2 Document in Chart   Glucose,  capillary     Status: Abnormal   Collection Time: 06/23/18  4:48 AM  Result Value Ref Range   Glucose-Capillary 164 (H) 70 - 99 mg/dL   Comment 1 Notify RN    Comment 2 Document in Chart   Blood gas, arterial     Status: Abnormal   Collection Time: 06/23/18  5:00 AM  Result Value Ref Range   FIO2 35.00    pH, Arterial 7.402 7.350 - 7.450   pCO2 arterial 33.8 32.0 - 48.0 mmHg   pO2, Arterial 49.0 (L) 83.0 - 108.0 mmHg   Bicarbonate 21.6 20.0 - 28.0 mmol/L   Acid-base deficit 3.4 (H) 0.0 - 2.0 mmol/L   O2 Saturation 82.9 %   Patient temperature 37.2    Allens test (pass/fail) PASS PASS    Comment: Performed at [facility], [address], [place], [place] [address]  Basic metabolic panel     Status: Abnormal   Collection Time: 06/23/18  5:06 AM  Result Value Ref Range   Sodium 139 135 - 145 mmol/L   Potassium 4.1 3.5 - 5.1 mmol/L    Comment: DELTA CHECK NOTED   Chloride 112 (H) 98 - 111 mmol/L   CO2 20 (L) 22 - 32 mmol/L   Glucose, Bld 187 (H) 70 - 99 mg/dL   BUN 15 8 - 23 mg/dL   Creatinine, Ser  0.58 0.44 - 1.00 mg/dL   Calcium 8.7 (L) 8.9 - 10.3 mg/dL   GFR calc non Af Amer >60 >60 mL/min   GFR calc Af Amer >60 >60 mL/min   Anion gap 7 5 - 15    Comment: Performed at [facility], [address] [place], [place] [address]  Magnesium     Status: Abnormal   Collection Time: 06/23/18  5:06 AM  Result Value Ref Range   Magnesium 1.5 (L) 1.7 - 2.4 mg/dL    Comment: Performed at [facility], [address] [place], [place] [address]  Phosphorus     Status: Abnormal   Collection Time: 06/23/18  5:06 AM  Result Value Ref Range   Phosphorus 2.4 (L) 2.5 - 4.6 mg/dL    Comment: Performed at [facility], [address] [place], [place] [address]  CBC with Differential/Platelet     Status: Abnormal   Collection Time: 06/23/18  5:06 AM  Result Value Ref Range   WBC 7.4 4.0 - 10.5 K/uL   RBC 3.39 (L) 3.87 - 5.11 MIL/uL   Hemoglobin 9.7 (L) 12.0 - 15.0 g/dL   HCT 31.7 (L) 36.0 -  46.0 %   MCV 93.5 80.0 - 100.0 fL   MCH 28.6 26.0 - 34.0 pg   MCHC 30.6 30.0 - 36.0 g/dL   RDW 14.0 11.5 - 15.5 %   Platelets 140 (L) 150 - 400 K/uL   nRBC 0.0 0.0 - 0.2 %   Neutrophils Relative % 81 %   Neutro Abs 6.0 1.7 - 7.7 K/uL   Lymphocytes Relative 7 %   Lymphs Abs 0.5 (L) 0.7 - 4.0 K/uL   Monocytes Relative 9 %   Monocytes Absolute 0.7 0.1 - 1.0 K/uL   Eosinophils Relative 2 %   Eosinophils Absolute 0.2 0.0 - 0.5 K/uL   Basophils Relative 1 %   Basophils Absolute 0.0 0.0 - 0.1 K/uL   Immature Granulocytes 0 %   Abs Immature Granulocytes 0.03 0.00 - 0.07 K/uL    Comment: Performed at [facility], [address] [place], [place] [address]  Glucose, capillary     Status: Abnormal   Collection Time: 06/23/18  8:09 AM  Result Value Ref Range   Glucose-Capillary 156 (H) 70 - 99 mg/dL  Glucose, capillary     Status: Abnormal   Collection Time: 06/23/18 11:28 AM  Result Value Ref Range   Glucose-Capillary 137 (H) 70 - 99 mg/dL  Glucose, capillary     Status: None   Collection Time: 06/23/18  4:20 PM  Result Value Ref Range   Glucose-Capillary 76 70 - 99 mg/dL  Glucose, capillary     Status: Abnormal   Collection Time: 06/23/18  7:48 PM  Result Value Ref Range   Glucose-Capillary 199 (H) 70 - 99 mg/dL  Glucose, capillary     Status: Abnormal   Collection Time: 06/24/18 12:25 AM  Result Value Ref Range   Glucose-Capillary 300 (H) 70 - 99 mg/dL  Comprehensive metabolic panel     Status: Abnormal   Collection Time: 06/24/18  4:09 AM  Result Value Ref Range   Sodium 135 135 - 145 mmol/L   Potassium 4.2 3.5 - 5.1 mmol/L   Chloride 109 98 - 111 mmol/L   CO2 19 (L) 22 - 32 mmol/L   Glucose, Bld 261 (H) 70 - 99 mg/dL   BUN 15 8 - 23 mg/dL   Creatinine, Ser 0.71 0.44 - 1.00 mg/dL   Calcium  8.2 (L) 8.9 - 10.3 mg/dL   Total Protein 4.9 (L) 6.5 - 8.1 g/dL   Albumin 2.1 (L) 3.5 - 5.0 g/dL   AST 25 15 - 41 U/L   ALT 9 0 - 44 U/L   Alkaline Phosphatase 59 38 - 126 U/L    Total Bilirubin 0.4 0.3 - 1.2 mg/dL   GFR calc non Af Amer >60 >60 mL/min   GFR calc Af Amer >60 >60 mL/min   Anion gap 7 5 - 15    Comment: Performed at [facility], [address] [place], [place] [address]  CBC     Status: Abnormal   Collection Time: 06/24/18  4:09 AM  Result Value Ref Range   WBC 6.9 4.0 - 10.5 K/uL   RBC 3.05 (L) 3.87 - 5.11 MIL/uL   Hemoglobin 9.0 (L) 12.0 - 15.0 g/dL   HCT 28.5 (L) 36.0 - 46.0 %   MCV 93.4 80.0 - 100.0 fL   MCH 29.5 26.0 - 34.0 pg   MCHC 31.6 30.0 - 36.0 g/dL   RDW 14.2 11.5 - 15.5 %   Platelets 145 (L) 150 - 400 K/uL   nRBC 0.0 0.0 - 0.2 %    Comment: Performed at [facility], [address] [place], [place] [address]  CK     Status: Abnormal   Collection Time: 06/24/18  4:09 AM  Result Value Ref Range   Total CK 414 (H) 38 - 234 U/L    Comment: Performed at [facility], [address] [place], [place] [address]  Glucose, capillary     Status: Abnormal   Collection Time: 06/24/18  4:22 AM  Result Value Ref Range   Glucose-Capillary 232 (H) 70 - 99 mg/dL  Blood gas, arterial     Status: Abnormal   Collection Time: 06/24/18  5:00 AM  Result Value Ref Range   FIO2 55.00    pH, Arterial 7.340 (L) 7.350 - 7.450   pCO2 arterial 36.2 32.0 - 48.0 mmHg   pO2, Arterial 67.0 (L) 83.0 - 108.0 mmHg   Bicarbonate 19.7 (L) 20.0 - 28.0 mmol/L   Acid-base deficit 5.7 (H) 0.0 - 2.0 mmol/L   O2 Saturation 90.3 %   Patient temperature 37.0    Allens test (pass/fail) PASS PASS    Comment: Performed at [facility], [address], [place], [place] [address]  Glucose, capillary     Status: Abnormal   Collection Time: 06/24/18  7:54 AM  Result Value Ref Range   Glucose-Capillary 200 (H) 70 - 99 mg/dL    ABGS Recent Labs    06/24/18 0500  PHART 7.340*  PO2ART 67.0*  HCO3 19.7*   CULTURES Recent Results (from the past 240 hour(s))  SARS Coronavirus 2 (CEPHEID - Performed in [facility] hospital lab), Hosp Order     Status: None   Collection  Time: 07/01/2018 12:15 PM  Result Value Ref Range Status   SARS Coronavirus 2 NEGATIVE NEGATIVE Final    Comment: (NOTE) If result is NEGATIVE SARS-CoV-2 target nucleic acids are NOT DETECTED. The SARS-CoV-2 RNA is generally detectable in upper and lower  respiratory specimens during the acute phase of infection. The lowest  concentration of SARS-CoV-2 viral copies this assay can detect is 250  copies / mL. A negative result does not preclude SARS-CoV-2 infection  and should not be used as the sole basis for treatment or other  patient management decisions.  A negative result may occur with  improper specimen  collection / handling, submission of specimen other  than nasopharyngeal swab, presence of viral mutation(s) within the  areas targeted by this assay, and inadequate number of viral copies  (<250 copies / mL). A negative result must be combined with clinical  observations, patient history, and epidemiological information. If result is POSITIVE SARS-CoV-2 target nucleic acids are DETECTED. The SARS-CoV-2 RNA is generally detectable in upper and lower  respiratory specimens dur ing the acute phase of infection.  Positive  results are indicative of active infection with SARS-CoV-2.  Clinical  correlation with patient history and other diagnostic information is  necessary to determine patient infection status.  Positive results do  not rule out bacterial infection or co-infection with other viruses. If result is PRESUMPTIVE POSTIVE SARS-CoV-2 nucleic acids MAY BE PRESENT.   A presumptive positive result was obtained on the submitted specimen  and confirmed on repeat testing.  While 2019 novel coronavirus  (SARS-CoV-2) nucleic acids may be present in the submitted sample  additional confirmatory testing may be necessary for epidemiological  and / or clinical management purposes  to differentiate between  SARS-CoV-2 and other Sarbecovirus currently known to infect humans.  If  clinically indicated additional testing with an alternate test  methodology [phone]) is advised. The SARS-CoV-2 RNA is generally  detectable in upper and lower respiratory sp ecimens during the acute  phase of infection. The expected result is Negative. Fact Sheet for Patients:  StrictlyIdeas.no Fact Sheet for Healthcare Providers: BankingDealers.co.za This test is not yet approved or cleared by the [place] FDA and has been authorized for detection and/or diagnosis of SARS-CoV-2 by FDA under an Emergency Use Authorization (EUA).  This EUA will remain in effect (meaning this test can be used) for the duration of the COVID-19 declaration under Section 564(b)(1) of the Act, 21 U.S.C. section 360bbb-3(b)(1), unless the authorization is terminated or revoked sooner. Performed at [facility], [address], [place], [place] [address]   Urine culture     Status: Abnormal   Collection Time: 07/12/2018  2:38 PM  Result Value Ref Range Status   Specimen Description   Final    URINE, CLEAN CATCH Performed at [facility], [address] [place], [place] [address]    Special Requests   Final    NONE Performed at [facility], [address] [place],  [address]    Culture 90,000 COLONIES/mL ESCHERICHIA COLI (A)  Final   Report Status 06/21/2018 FINAL  Final   Organism ID, Bacteria ESCHERICHIA COLI (A)  Final      Susceptibility   Escherichia coli - MIC*    AMPICILLIN 4 SENSITIVE Sensitive     CEFAZOLIN <=4 SENSITIVE Sensitive     CEFTRIAXONE <=1 SENSITIVE Sensitive     CIPROFLOXACIN >=4 RESISTANT Resistant     GENTAMICIN <=1 SENSITIVE Sensitive     IMIPENEM <=0.25 SENSITIVE Sensitive     NITROFURANTOIN <=16 SENSITIVE Sensitive     TRIMETH/SULFA <=20 SENSITIVE Sensitive     AMPICILLIN/SULBACTAM <=2 SENSITIVE Sensitive     PIP/TAZO <=4 SENSITIVE Sensitive     Extended ESBL NEGATIVE Sensitive     * 90,000 COLONIES/mL ESCHERICHIA  COLI  MRSA PCR Screening     Status: None   Collection Time: 07/02/2018  7:13 PM  Result Value Ref Range Status   MRSA by PCR NEGATIVE NEGATIVE Final    Comment:        The GeneXpert MRSA Assay (FDA approved for NASAL specimens only), is one component of a  comprehensive MRSA colonization surveillance program. It is not intended to diagnose MRSA infection nor to guide or monitor treatment for MRSA infections. Performed at [facility], [address], [place], [place] [address]   Culture, blood (routine x 2)     Status: None   Collection Time: 06/16/2018  9:04 PM  Result Value Ref Range Status   Specimen Description BLOOD LEFT HAND  Final   Special Requests   Final    BOTTLES DRAWN AEROBIC AND ANAEROBIC Blood Culture adequate volume   Culture   Final    NO GROWTH 5 DAYS Performed at [facility], [address], [place], [place] [address]    Report Status 06/24/2018 FINAL  Final  Culture, blood (routine x 2)     Status: None   Collection Time: 07/06/2018  9:05 PM  Result Value Ref Range Status   Specimen Description BLOOD RIGHT HAND  Final   Special Requests   Final    BOTTLES DRAWN AEROBIC AND ANAEROBIC Blood Culture adequate volume   Culture   Final    NO GROWTH 5 DAYS Performed at [facility], [address], [place], [place] [address]    Report Status 06/24/2018 FINAL  Final   Studies/Results: Dg Naso G Tube Plc W/fl W/rad  Result Date: 06/23/2018 CLINICAL DATA:  Difficulty advancing OG tube into the stomach EXAM: NASO G TUBE PLACEMENT WITH FL AND WITH RAD CONTRAST:  20 mL Omnipaque 300 FLUOROSCOPY TIME:  Fluoroscopy Time:  5 seconds Radiation Exposure Index (if provided by the fluoroscopic device): Number of Acquired Spot Images: 0 COMPARISON:  Abdomen film Jun 21, 2018 FINDINGS: Fluoroscopy over the lower chest and upper abdomen demonstrates the tube to have advanced since prior KUB with the tip in the proximal stomach. This was advanced slightly further with the tip now in the  mid to distal stomach. Contrast was injected to confirm placement. IMPRESSION: OG tube advanced into the mid to distal stomach. Electronically Signed   By: Rolm Baptise M.D.   On: 06/23/2018 15:49    Medications:  Prior to Admission:  Medications Prior to Admission  Medication Sig Dispense Refill Last Dose  . acetaminophen (TYLENOL) 650 MG CR tablet Take 650 mg by mouth 2 (two) times daily as needed for pain.   Taking  . carbidopa-levodopa (SINEMET IR) 10-100 MG tablet Take 1 Table tby mouth 3 Times Daily As Directed for Parkinson's Disease   Taking  . clopidogrel (PLAVIX) 75 MG tablet Take 75 mg by mouth at bedtime.     at unknown  . furosemide (LASIX) 40 MG tablet 40 mg. Take 1/2tablet by mouth twice daily   Taking  . glipiZIDE (GLUCOTROL XL) 10 MG 24 hr tablet TAKE  (1)  TABLET BY MOUTH TWICE A DAY.   Taking  . levothyroxine (SYNTHROID) 100 MCG tablet Take 1 tablet by mouth daily.     Marland Kitchen linagliptin (TRADJENTA) 5 MG TABS tablet Take 5 mg by mouth daily.    Taking  . lisinopril (ZESTRIL) 20 MG tablet Take 1 tablet by mouth daily.    at unknown  . pramipexole (MIRAPEX) 0.75 MG tablet Take 0.75 mg by mouth 3 (three) times daily.   Taking  . pravastatin (PRAVACHOL) 40 MG tablet Take 1 tablet by mouth daily.   Taking  . pregabalin (LYRICA) 100 MG capsule Take 100 mg by mouth 3 (three) times daily.    Taking  . selegiline (ELDEPRYL) 5 MG capsule Take 5 mg by mouth 2 (two) times  daily.    Taking  . Vitamin D, Ergocalciferol, (DRISDOL) 50000 UNITS CAPS capsule Take 1 capsule by mouth once a week.   Taking  . tiZANidine (ZANAFLEX) 4 MG tablet 1/2 or 1 Tablet by mout Up To 4 Times Per Day for Muscle Spasms. May Cause Drowsiness   Taking   Scheduled: . aspirin  300 mg Rectal Daily  . carbidopa-levodopa  1 tablet Oral TID  . chlorhexidine gluconate (MEDLINE KIT)  15 mL Mouth Rinse BID  . Chlorhexidine Gluconate Cloth  6 each Topical Q0600  . cyanocobalamin  1,000 mcg Intramuscular Daily  . feeding  supplement (OSMOLITE 1.5 CAL)  1,000 mL Per Tube Q24H  . insulin aspart  0-9 Units Subcutaneous Q4H  . levothyroxine  50 mcg Intravenous Daily  . mouth rinse  15 mL Mouth Rinse 10 times per day  . rotigotine  1 patch Transdermal Daily   Continuous: . dextrose 5 % and 0.45 % NaCl with KCl 20 mEq/L 75 mL/hr at 06/24/18 0656  . famotidine (PEPCID) IV Stopped (06/23/18 1030)  . levETIRAcetam Stopped (06/23/18 2012)   VQQ:[id number], albuterol, fentaNYL (SUBLIMAZE) injection, fentaNYL (SUBLIMAZE) injection, ondansetron (ZOFRAN) IV  Assesment: She was admitted with altered mental status.  She was seen 24 hours before and was sleepy but able to hold a conversation.  She has what appears to be a urinary tract infection.  At baseline she has Parkinson's disease and we have had trouble getting the medications into her because of difficulty get an NG in place.  This has been placed now and appears to be in proper position.  She has acute hypoxic respiratory failure.  She seems to be doing okay as far as her ability to ventilate but I do not think she can protect her airway so we are going to leave her on the ventilator and intubated at this point.  She has rhabdomyolysis which is getting better Active Problems:   Lower urinary tract infectious disease   Unresponsiveness   Acute encephalopathy   Acute respiratory failure with hypoxia (HCC)   Rhabdomyolysis   Feeding difficulty in elderly   Goals of care, counseling/discussion   Palliative care encounter    Plan: Continue current treatments    LOS: 5 days   Alonza Bogus 06/24/2018, 8:08 AM

## 2018-06-24 NOTE — Progress Notes (Signed)
[place] A. Merlene Laughter, MD     [email or web address]          Jennifer [name] is an 85 y.o. female.   Assessment/Plan: 1.  Acute encephalopathy: Etiology likely multifactorial including acute UTI.  Imaging does not show that the patient has had an infarct.  EEG has been negative for seizures.  2.  Parkinson disease: She is mildly less parkinsonian today.  I will increase the dose of the Sinemet.  3.  Vitamin B-12 deficiency: This will be replaced.   Patient overall remains unchanged.  Status post NG tube placement by radiology.     GENERAL: This is a thin lady who is intubated.  The examination is done while the patient is on fentanyl infusion.  HEENT: Neck is supple no trauma appreciated  ABDOMEN: soft  EXTREMITIES: No edema   BACK: Normal  SKIN: Normal by inspection.    MENTAL STATUS: There is partial eye opening.  There is no verbal output or attempt to follow commands.  CRANIAL NERVES: Pupils are equal, round and reactive to light;  she has roving eye movements in both directions. Extra ocular movements are full with oculocephalic reflexes, upper and lower facial muscles are normal in strength and symmetric, there is no flattening of the nasolabial folds  MOTOR: There is mild flexion withdrawal to deep painful stimuli bilaterally.  She is somewhat more responsive involving the right upper extremity.  COORDINATION: No tremor is noted.  There is moderate rigidity bilaterally and severe bradykinesia.  REFLEXES: Deep tendon reflexes are symmetrical and normal.  Plantar responses are classically upgoing on the left and equivocal on the right.  SENSATION: Normal to pain.    EEG shows moderate slowing.  There is also frontocentral delta slowing episodically.   Objective: Vital signs in last 24 hours: Temp:  [98.8 F (37.1 C)-100.4 F (38 C)] 100.4 F (38 C) (05/12 1600) Pulse Rate:  [80-117] 110 (05/12 1700) Resp:  [11-38] 34 (05/12  1700) BP: (98-165)/(36-72) 165/58 (05/12 1700) SpO2:  [87 %-100 %] 93 % (05/12 1700) FiO2 (%):  [40 %-55 %] 50 % (05/12 1947) Weight:  [67.4 kg] 67.4 kg (05/12 0500)  Intake/Output from previous day: 05/11 0701 - 05/12 0700 In: 2574.2 [I.V.:1955.2; NG/GT:30; IV Piggyback:589] Out: 500 [Urine:500] Intake/Output this shift: No intake/output data recorded. Nutritional status:  Diet Order    None       Lab Results: Results for orders placed or performed during the hospital encounter of 07/13/2018 (from the past 48 hour(s))  Glucose, capillary     Status: Abnormal   Collection Time: 06/23/18 12:16 AM  Result Value Ref Range   Glucose-Capillary 125 (H) 70 - 99 mg/dL   Comment 1 Notify RN    Comment 2 Document in Chart   Glucose, capillary     Status: Abnormal   Collection Time: 06/23/18  4:48 AM  Result Value Ref Range   Glucose-Capillary 164 (H) 70 - 99 mg/dL   Comment 1 Notify RN    Comment 2 Document in Chart   Blood gas, arterial     Status: Abnormal   Collection Time: 06/23/18  5:00 AM  Result Value Ref Range   FIO2 35.00    pH, Arterial 7.402 7.350 - 7.450   pCO2 arterial 33.8 32.0 - 48.0 mmHg   pO2, Arterial 49.0 (L) 83.0 - 108.0 mmHg   Bicarbonate 21.6 20.0 - 28.0 mmol/L   Acid-base deficit 3.4 (H) 0.0 - 2.0 mmol/L   O2 Saturation  82.9 %   Patient temperature 37.2    Allens test (pass/fail) PASS PASS    Comment: Performed at [facility], [address], [place], [place] [address]  Basic metabolic panel     Status: Abnormal   Collection Time: 06/23/18  5:06 AM  Result Value Ref Range   Sodium 139 135 - 145 mmol/L   Potassium 4.1 3.5 - 5.1 mmol/L    Comment: DELTA CHECK NOTED   Chloride 112 (H) 98 - 111 mmol/L   CO2 20 (L) 22 - 32 mmol/L   Glucose, Bld 187 (H) 70 - 99 mg/dL   BUN 15 8 - 23 mg/dL   Creatinine, Ser 0.58 0.44 - 1.00 mg/dL   Calcium 8.7 (L) 8.9 - 10.3 mg/dL   GFR calc non Af Amer >60 >60 mL/min   GFR calc Af Amer >60 >60 mL/min   Anion gap 7 5 -  15    Comment: Performed at [facility], [address] [place], [place] [address]  Magnesium     Status: Abnormal   Collection Time: 06/23/18  5:06 AM  Result Value Ref Range   Magnesium 1.5 (L) 1.7 - 2.4 mg/dL    Comment: Performed at [facility], [address] [place], [place] [address]  Phosphorus     Status: Abnormal   Collection Time: 06/23/18  5:06 AM  Result Value Ref Range   Phosphorus 2.4 (L) 2.5 - 4.6 mg/dL    Comment: Performed at [facility], [address], [place], [place] [address]  CBC with Differential/Platelet     Status: Abnormal   Collection Time: 06/23/18  5:06 AM  Result Value Ref Range   WBC 7.4 4.0 - 10.5 K/uL   RBC 3.39 (L) 3.87 - 5.11 MIL/uL   Hemoglobin 9.7 (L) 12.0 - 15.0 g/dL   HCT 31.7 (L) 36.0 - 46.0 %   MCV 93.5 80.0 - 100.0 fL   MCH 28.6 26.0 - 34.0 pg   MCHC 30.6 30.0 - 36.0 g/dL   RDW 14.0 11.5 - 15.5 %   Platelets 140 (L) 150 - 400 K/uL   nRBC 0.0 0.0 - 0.2 %   Neutrophils Relative % 81 %   Neutro Abs 6.0 1.7 - 7.7 K/uL   Lymphocytes Relative 7 %   Lymphs Abs 0.5 (L) 0.7 - 4.0 K/uL   Monocytes Relative 9 %   Monocytes Absolute 0.7 0.1 - 1.0 K/uL   Eosinophils Relative 2 %   Eosinophils Absolute 0.2 0.0 - 0.5 K/uL   Basophils Relative 1 %   Basophils Absolute 0.0 0.0 - 0.1 K/uL   Immature Granulocytes 0 %   Abs Immature Granulocytes 0.03 0.00 - 0.07 K/uL    Comment: Performed at [facility], [address], [place], [place] [address]  Glucose, capillary     Status: Abnormal   Collection Time: 06/23/18  8:09 AM  Result Value Ref Range   Glucose-Capillary 156 (H) 70 - 99 mg/dL  Glucose, capillary     Status: Abnormal   Collection Time: 06/23/18 11:28 AM  Result Value Ref Range   Glucose-Capillary 137 (H) 70 - 99 mg/dL  Glucose, capillary     Status: None   Collection Time: 06/23/18  4:20 PM  Result Value Ref Range   Glucose-Capillary 76 70 - 99 mg/dL  Glucose, capillary     Status: Abnormal   Collection Time: 06/23/18  7:48 PM   Result Value Ref Range   Glucose-Capillary 199 (H) 70 - 99  mg/dL  Glucose, capillary     Status: Abnormal   Collection Time: 06/24/18 12:25 AM  Result Value Ref Range   Glucose-Capillary 300 (H) 70 - 99 mg/dL  Comprehensive metabolic panel     Status: Abnormal   Collection Time: 06/24/18  4:09 AM  Result Value Ref Range   Sodium 135 135 - 145 mmol/L   Potassium 4.2 3.5 - 5.1 mmol/L   Chloride 109 98 - 111 mmol/L   CO2 19 (L) 22 - 32 mmol/L   Glucose, Bld 261 (H) 70 - 99 mg/dL   BUN 15 8 - 23 mg/dL   Creatinine, Ser 0.71 0.44 - 1.00 mg/dL   Calcium 8.2 (L) 8.9 - 10.3 mg/dL   Total Protein 4.9 (L) 6.5 - 8.1 g/dL   Albumin 2.1 (L) 3.5 - 5.0 g/dL   AST 25 15 - 41 U/L   ALT 9 0 - 44 U/L   Alkaline Phosphatase 59 38 - 126 U/L   Total Bilirubin 0.4 0.3 - 1.2 mg/dL   GFR calc non Af Amer >60 >60 mL/min   GFR calc Af Amer >60 >60 mL/min   Anion gap 7 5 - 15    Comment: Performed at [facility], [address], [place], [place] [address]  CBC     Status: Abnormal   Collection Time: 06/24/18  4:09 AM  Result Value Ref Range   WBC 6.9 4.0 - 10.5 K/uL   RBC 3.05 (L) 3.87 - 5.11 MIL/uL   Hemoglobin 9.0 (L) 12.0 - 15.0 g/dL   HCT 28.5 (L) 36.0 - 46.0 %   MCV 93.4 80.0 - 100.0 fL   MCH 29.5 26.0 - 34.0 pg   MCHC 31.6 30.0 - 36.0 g/dL   RDW 14.2 11.5 - 15.5 %   Platelets 145 (L) 150 - 400 K/uL   nRBC 0.0 0.0 - 0.2 %    Comment: Performed at [facility], [address] [place], [place] [address]  CK     Status: Abnormal   Collection Time: 06/24/18  4:09 AM  Result Value Ref Range   Total CK 414 (H) 38 - 234 U/L    Comment: Performed at [facility], [address] [place], [place] [address]  Glucose, capillary     Status: Abnormal   Collection Time: 06/24/18  4:22 AM  Result Value Ref Range   Glucose-Capillary 232 (H) 70 - 99 mg/dL  Blood gas, arterial     Status: Abnormal   Collection Time: 06/24/18  5:00 AM  Result Value Ref Range   FIO2 55.00    pH, Arterial 7.340 (L) 7.350 -  7.450   pCO2 arterial 36.2 32.0 - 48.0 mmHg   pO2, Arterial 67.0 (L) 83.0 - 108.0 mmHg   Bicarbonate 19.7 (L) 20.0 - 28.0 mmol/L   Acid-base deficit 5.7 (H) 0.0 - 2.0 mmol/L   O2 Saturation 90.3 %   Patient temperature 37.0    Allens test (pass/fail) PASS PASS    Comment: Performed at [facility], [address], [place], [place] [address]  Glucose, capillary     Status: Abnormal   Collection Time: 06/24/18  7:54 AM  Result Value Ref Range   Glucose-Capillary 200 (H) 70 - 99 mg/dL  Glucose, capillary     Status: Abnormal   Collection Time: 06/24/18 11:41 AM  Result Value Ref Range   Glucose-Capillary 210 (H) 70 - 99 mg/dL  Glucose, capillary     Status: Abnormal   Collection Time: 06/24/18  3:51 PM  Result Value Ref Range   Glucose-Capillary 249 (H) 70 - 99 mg/dL   Comment 1 Notify RN    Comment 2 Document in Chart   Procalcitonin - Baseline     Status: None   Collection Time: 06/24/18  5:28 PM  Result Value Ref Range   Procalcitonin <0.10 ng/mL    Comment:        Interpretation: PCT (Procalcitonin) <= 0.5 ng/mL: Systemic infection (sepsis) is not likely. Local bacterial infection is possible. (NOTE)       Sepsis PCT Algorithm           Lower Respiratory Tract                                      Infection PCT Algorithm    ----------------------------     ----------------------------         PCT < 0.25 ng/mL                PCT < 0.10 ng/mL         Strongly encourage             Strongly discourage   discontinuation of antibiotics    initiation of antibiotics    ----------------------------     -----------------------------       PCT 0.25 - 0.50 ng/mL            PCT 0.10 - 0.25 ng/mL               OR       >80% decrease in PCT            Discourage initiation of                                            antibiotics      Encourage discontinuation           of antibiotics    ----------------------------     -----------------------------         PCT >= 0.50 ng/mL               PCT 0.26 - 0.50 ng/mL               AND        <80% decrease in PCT             Encourage initiation of                                             antibiotics       Encourage continuation           of antibiotics    ----------------------------     -----------------------------        PCT >= 0.50 ng/mL                  PCT > 0.50 ng/mL               AND         increase in PCT                  Strongly encourage  initiation of antibiotics    Strongly encourage escalation           of antibiotics                                     -----------------------------                                           PCT <= 0.25 ng/mL                                                 OR                                        > 80% decrease in PCT                                     Discontinue / Do not initiate                                             antibiotics Performed at [facility], [address], [place], [place] [address]   Glucose, capillary     Status: Abnormal   Collection Time: 06/24/18  7:36 PM  Result Value Ref Range   Glucose-Capillary 235 (H) 70 - 99 mg/dL    Lipid Panel No results for input(s): CHOL, TRIG, HDL, CHOLHDL, VLDL, LDLCALC in the last 72 hours.  Studies/Results:  REPEAT HEAD CT  FINDINGS: Brain: Stable cerebral atrophy. Negative for acute hemorrhage, mass lesion, midline shift, hydrocephalus or large infarct. Again noted are hypodensities in the white matter suggesting chronic changes. Focal low-density in the right frontal-parietal white matter is compatible with old ischemic changes. Patient's head rotated on this examination.  Vascular: No hyperdense vessel or unexpected calcification.  Skull: Normal. Negative for fracture or focal lesion.  Sinuses/Orbits: No acute finding.  Other: None.  IMPRESSION: 1. No acute intracranial abnormality. 2. Stable atrophy and chronic white matter changes. Findings are  most compatible with chronic small vessel ischemic changes.     Medications:  Scheduled Meds: . aspirin  300 mg Rectal Daily  . carbidopa-levodopa  1 tablet Oral TID  . chlorhexidine gluconate (MEDLINE KIT)  15 mL Mouth Rinse BID  . Chlorhexidine Gluconate Cloth  6 each Topical Q0600  . cyanocobalamin  1,000 mcg Intramuscular Daily  . feeding supplement (OSMOLITE 1.5 CAL)  1,000 mL Per Tube Q24H  . insulin aspart  0-9 Units Subcutaneous Q4H  . levothyroxine  50 mcg Intravenous Daily  . mouth rinse  15 mL Mouth Rinse 10 times per day  . metoCLOPramide (REGLAN) injection  5 mg Intravenous Q8H  . rotigotine  1 patch Transdermal Daily   Continuous Infusions: . ceFEPime (MAXIPIME) IV    . dextrose 5 % and 0.45 % NaCl with KCl 20 mEq/L Stopped (06/24/18 1643)  . famotidine (PEPCID) IV Stopped (06/24/18 1110)  .  levETIRAcetam Stopped (06/24/18 0855)  . metronidazole 100 mL/hr at 06/24/18 1753   PRN Meds:.acetaminophen, albuterol, fentaNYL (SUBLIMAZE) injection, fentaNYL (SUBLIMAZE) injection, ondansetron (ZOFRAN) IV     LOS: 5 days   Kofi A. Merlene Laughter, M.D.  Diplomate, Tax adviser of Psychiatry and Neurology ( Neurology).

## 2018-06-24 NOTE — Progress Notes (Signed)
Pharmacy Antibiotic Note  Jennifer [name] is a 85 y.o. female admitted on 06/27/2018 with pneumonia/HCAP.  Pharmacy has been consulted for Cefepime dosing.  Plan: Cefepime 2gm IV q12h F/Ucxs and clinical progress Monitor V/S, labs  Height: 5\' 3"  (160 cm) Weight: 148 lb 9.4 oz (67.4 kg) IBW/kg (Calculated) : 52.4  Temp (24hrs), Avg:99.6 F (37.6 C), Min:98.8 F (37.1 C), Max:100.4 F (38 C)  Recent Labs  Lab 07/13/2018 1745 06/20/18 0344 06/21/18 0451 06/22/18 0446 06/23/18 0506 06/24/18 0409  WBC  --  9.7 8.5 6.6 7.4 6.9  CREATININE  --  1.21* 1.00 0.65 0.58 0.71  LATICACIDVEN 1.7  --   --   --   --   --     Estimated Creatinine Clearance: 46.5 mL/min (by C-G formula based on SCr of 0.71 mg/dL).    Allergies  Allergen Reactions  . Ace Inhibitors Other (See Comments)    Hyperkalemia-mild   . Codeine Nausea And Vomiting  . Penicillins Swelling  . Ropinirole Hcl Other (See Comments)    Pt wasn't in right state of mind    Antimicrobials this admission: Cefepime 5/12>>  Ceftriaxone 5/7>> 5/12  Dose adjustments this admission: n/a  Microbiology results: 5/7 BCX: ngtd 5/7 UCx: 90K CFU/ml s- ceftriaxone R-cipro  5/7 MRSA PCR: negative  Thank you for allowing pharmacy to be a part of this patient's care.  Elder Cyphers, BS Loura Back, [place] Clinical Pharmacist Pager [phone] 06/24/2018 5:39 PM

## 2018-06-24 NOTE — Progress Notes (Signed)
PROGRESS NOTE  Jennifer [name]:[id number] DOB: 06-Aug-1930 DOA: 06/14/2018 PCP: [name], MD  Brief History: 85 year old female with a history of TIA, Parkinson's disease, hypothyroidism, hypertension, diabetes mellitus, hyperlipidemia, PSVT status post RFA 2006, and tobacco abuse in remission presented with unresponsiveness. The patient lives by herself, but her granddaughter intermittently checks up on her. On 06/18/2018, the patient granddaughter noted the patient to be lethargic, but felt that the patient was just tired and sleepy. She went back to check up on the patient on the morning of 07/04/2018 and noted the patient to be essentially unresponsive. EMS was activated. In the emergency department, the patient was intubated to protect her airway secondary to her encephalopathy. According to the patient's daughter, the patient has had a long history of poor compliance with her medications. It was felt in the past that her encephalopathy may have been partly attributed to not taking her Parkinson's medicines as well as he thyroid replacement. However, the patient has never been unresponsive. There is been no history of recent fevers, chills, complaints of chest pain, shortness breath, vomiting, diarrhea. At baseline, the patient has some pleasant confusion but is able to converse. She normally gets around in a wheelchair, but occasionally uses a walker. In the emergency department, the patient had temperature 100.4 F, but blood pressure as high as 208/73. EKG showed sinus tachycardia without any ST-T wave changes. CPK was 1065. UA showed 21-50 WBC. Otherwise BMP and CBC were essentially unremarkable. CT of the brain was negative for any acute findings. Chest x-ray was negative for any infiltrates. Neurology and pulmonarywereconsulted to assist with management.  Assessment/Plan: Acute encephalopathy, type unspecified -Etiology unclear although likely multifactorial  including UTI, B12 deficiency, possible seizure, levodopa withdraw -Appreciate neurology consult -06/18/2018 CT brain negative for acute findings -MRI of brain--cannot obtain while on ventilator -06/20/18 CT brain--neg for acute findings -Folic MOLM--7.8 -MLJQGBE--01 -TSH 0.462 -Urine drug screen negative -Personally reviewed EKG--sinus rhythm, no ST-T wave changes -06/23/2018--patient remainsminimally responsivetoprotopathic stimuli despite being off sedation -Obtained EEG--global slowing and triphasics consistent with metabolic encephalopathy -continue empiric keppra -have asked pharmacy to try to obtain SL carbidopa/levodopa (Parcopa) or transdermal dopamine agonist (Neupro/rotigotine)--unclear how much po meds getting absorbed  Acute respiratory failure with hypoxia -Secondary to hypoventilation from encephalopathy; now due to HCAP and pulmonary edema -Currently on mechanical ventilation (intubated 5/7) -5/12 personally reviewed CXR--increase interstitial markings increase RLL opacity -increasing FiO2 in past 24 hours -lasix 40 mg IV x 1 on 5/12 -06/23/18--started enteral feeding  HCAP/Aspiration pneumonitis -pt had n/v today -CXR as discussed above -5/12--start cefepime/metronidazole -tracheal aspirate for culture -blood cultures x 2 -check PCT  UTI--EColi -Continue ceftriaxone--finished 5 days 5/11  B12 deficiency -Repleting IM daily x 7 days, then once per week x 4 weeks  Parkinson's Disease -having difficulty obtaining NG/OG per nursing despite multiple attempts -discussed with IR--not able to place until next week -appreciate GI assistance-->OG placed to mid-esophagus-->trial of meds only with patient sitting at 45 degrees -Start SL carbidopa/levodopa (Parcopa) or transdermal dopamine agonist (Neupro/rotigotine)  Nontraumatic rhabdomyolysis -Initial CPK 1065>>>575>>>414 -Judicious IV fluids  Elevated troponin -Secondary to demand ischemia -Trend is  flat -Echocardiogram--60-65%, no PFO, grade 1 DD  FEN -NG/OG difficulties as noted above -place PICC for TPNif unable to get enteral feeding -change to D51/2NS  Diabetes mellitus type 2 -Holding glipizide and Tradjenta -NovoLog sliding scale -Hemoglobin A1c--6.2  Hypothyroidism -ContinueIV Synthroid  Hyperlipidemia -Restart statin   The patient is  critically ill with multiple organ systems failure and requires high complexity decision making for assessment and support, frequent evaluation and titration of therapies, application of advanced monitoring technologies and extensive interpretation of multiple databases.  Critical care time -35 mins.    Disposition Plan:Remain in ICU Family Communication:Daughter updated on phone 5/11  Consultants:pulmonary, palliative  Code Status: FULL  DVT Prophylaxis: [place] Lovenox   Procedures: As Listed in Progress Note Above  Antibiotics: Ceftriaxone 5/7>>5/11    Subjective: Pt intubated on vent.  Having tachypnea on vent today.  Had n/v x 1.  No diarrhea.  No reports of uncontrolled pain  Objective: Vitals:   06/24/18 1500 06/24/18 1515 06/24/18 1600 06/24/18 1700  BP: (!) 126/38  (!) 134/47 (!) 165/58  Pulse: 93  94 (!) 110  Resp: (!) 28  (!) 28 (!) 34  Temp: (!) 100.4 F (38 C)  (!) 100.4 F (38 C)   TempSrc:      SpO2: 97% 97% 97% 93%  Weight:      Height:        Intake/Output Summary (Last 24 hours) at 06/24/2018 1720 Last data filed at 06/24/2018 1644 Gross per 24 hour  Intake 3278.31 ml  Output 575 ml  Net 2703.31 ml   Weight change: 2.1 kg Exam:   General:  Pt postures to protopathic stimuli  HEENT: No icterus, No thrush, No neck mass, [place]/AT  Cardiovascular: RRR, S1/S2, no rubs, no gallops  Respiratory: bilateral scattered rhonchi  Abdomen: Soft/+BS, non tender, non distended, no guarding  Extremities: No edema, No lymphangitis, No petechiae, No rashes, no  synovitis   Data Reviewed: I have personally reviewed following labs and imaging studies Basic Metabolic Panel: Recent Labs  Lab 06/26/2018 1719 06/20/18 0344 06/21/18 0451 06/22/18 0446 06/23/18 0506 06/24/18 0409  NA  --  144 147* 145 139 135  K  --  4.1 3.8 3.4* 4.1 4.2  CL  --  113* 116* 116* 112* 109  CO2  --  16* 15* 21* 20* 19*  GLUCOSE  --  140* 135* 170* 187* 261*  BUN  --  30* 26* '18 15 15  '$ CREATININE  --  1.21* 1.00 0.65 0.58 0.71  CALCIUM  --  8.7* 8.8* 9.1 8.7* 8.2*  MG 2.3  --   --  1.8 1.5*  --   PHOS  --   --   --  2.0* 2.4*  --    Liver Function Tests: Recent Labs  Lab 06/26/2018 1306 06/22/18 0446 06/24/18 0409  AST 43* 25 25  ALT '22 5 9  '$ ALKPHOS 83 57 59  BILITOT 0.9 0.7 0.4  PROT 7.1 5.4* 4.9*  ALBUMIN 3.8 2.5* 2.1*   No results for input(s): LIPASE, AMYLASE in the last 168 hours. Recent Labs  Lab 06/20/18 0855  AMMONIA 12   Coagulation Profile: Recent Labs  Lab 06/15/2018 1306  INR 1.0   CBC: Recent Labs  Lab 06/22/2018 1306  06/20/18 0344 06/21/18 0451 06/22/18 0446 06/23/18 0506 06/24/18 0409  WBC 7.6  --  9.7 8.5 6.6 7.4 6.9  NEUTROABS 6.3  --   --   --  5.3 6.0  --   HGB 14.3  --  10.8* 10.1* 9.7* 9.7* 9.0*  HCT 45.7   < > 34.0* 33.4* 30.3* 31.7* 28.5*  MCV 93.8  --  94.4 96.3 92.9 93.5 93.4  PLT 195  --  177 159 139* 140* 145*   < > = values in this interval  not displayed.   Cardiac Enzymes: Recent Labs  Lab 07/06/2018 1306 06/26/2018 1551 06/28/2018 2104 06/20/18 0344 06/21/18 0451 06/22/18 0446 06/24/18 0409  CKTOTAL 1,065*  --   --  721* 575* 433* 414*  TROPONINI  --  0.05* 0.06* 0.06*  --   --   --    BNP: Invalid input(s): POCBNP CBG: Recent Labs  Lab 06/24/18 0025 06/24/18 0422 06/24/18 0754 06/24/18 1141 06/24/18 1551  GLUCAP 300* 232* 200* 210* 249*   HbA1C: No results for input(s): HGBA1C in the last 72 hours. Urine analysis:    Component Value Date/Time   COLORURINE AMBER (A) 07/05/2018 1213    APPEARANCEUR CLOUDY (A) 07/12/2018 1213   LABSPEC 1.023 06/22/2018 1213   PHURINE 5.0 06/16/2018 1213   GLUCOSEU NEGATIVE 06/17/2018 1213   HGBUR MODERATE (A) 06/24/2018 1213   BILIRUBINUR NEGATIVE 06/28/2018 1213   KETONESUR 20 (A) 06/16/2018 1213   PROTEINUR 30 (A) 06/21/2018 1213   UROBILINOGEN 0.2 11/20/2012 1254   NITRITE POSITIVE (A) 06/21/2018 1213   LEUKOCYTESUR MODERATE (A) 06/21/2018 1213   Sepsis Labs: '@LABRCNTIP'$ (procalcitonin:4,lacticidven:4) ) Recent Results (from the past 240 hour(s))  SARS Coronavirus 2 (CEPHEID - Performed in [place] hospital lab), Hosp Order     Status: None   Collection Time: 06/14/2018 12:15 PM  Result Value Ref Range Status   SARS Coronavirus 2 NEGATIVE NEGATIVE Final    Comment: (NOTE) If result is NEGATIVE SARS-CoV-2 target nucleic acids are NOT DETECTED. The SARS-CoV-2 RNA is generally detectable in upper and lower  respiratory specimens during the acute phase of infection. The lowest  concentration of SARS-CoV-2 viral copies this assay can detect is 250  copies / mL. A negative result does not preclude SARS-CoV-2 infection  and should not be used as the sole basis for treatment or other  patient management decisions.  A negative result may occur with  improper specimen collection / handling, submission of specimen other  than nasopharyngeal swab, presence of viral mutation(s) within the  areas targeted by this assay, and inadequate number of viral copies  (<250 copies / mL). A negative result must be combined with clinical  observations, patient history, and epidemiological information. If result is POSITIVE SARS-CoV-2 target nucleic acids are DETECTED. The SARS-CoV-2 RNA is generally detectable in upper and lower  respiratory specimens dur ing the acute phase of infection.  Positive  results are indicative of active infection with SARS-CoV-2.  Clinical  correlation with patient history and other diagnostic information is  necessary  to determine patient infection status.  Positive results do  not rule out bacterial infection or co-infection with other viruses. If result is PRESUMPTIVE POSTIVE SARS-CoV-2 nucleic acids MAY BE PRESENT.   A presumptive positive result was obtained on the submitted specimen  and confirmed on repeat testing.  While 2019 novel coronavirus  (SARS-CoV-2) nucleic acids may be present in the submitted sample  additional confirmatory testing may be necessary for epidemiological  and / or clinical management purposes  to differentiate between  SARS-CoV-2 and other Sarbecovirus currently known to infect humans.  If clinically indicated additional testing with an alternate test  methodology [phone]) is advised. The SARS-CoV-2 RNA is generally  detectable in upper and lower respiratory sp ecimens during the acute  phase of infection. The expected result is Negative. Fact Sheet for Patients:  StrictlyIdeas.no Fact Sheet for Healthcare Providers: BankingDealers.co.za This test is not yet approved or cleared by the [place] FDA and has been authorized for detection and/or diagnosis  of SARS-CoV-2 by FDA under an Emergency Use Authorization (EUA).  This EUA will remain in effect (meaning this test can be used) for the duration of the COVID-19 declaration under Section 564(b)(1) of the Act, 21 U.S.C. section 360bbb-3(b)(1), unless the authorization is terminated or revoked sooner. Performed at [facility], [address] [place], [place] [address]   Urine culture     Status: Abnormal   Collection Time: 06/15/2018  2:38 PM  Result Value Ref Range Status   Specimen Description   Final    URINE, CLEAN CATCH Performed at [facility], [address] [place], [place] [address]    Special Requests   Final    NONE Performed at [facility], [address] [place], [place] [address]    Culture 90,000 COLONIES/mL ESCHERICHIA COLI (A)  Final   Report  Status 06/21/2018 FINAL  Final   Organism ID, Bacteria ESCHERICHIA COLI (A)  Final      Susceptibility   Escherichia coli - MIC*    AMPICILLIN 4 SENSITIVE Sensitive     CEFAZOLIN <=4 SENSITIVE Sensitive     CEFTRIAXONE <=1 SENSITIVE Sensitive     CIPROFLOXACIN >=4 RESISTANT Resistant     GENTAMICIN <=1 SENSITIVE Sensitive     IMIPENEM <=0.25 SENSITIVE Sensitive     NITROFURANTOIN <=16 SENSITIVE Sensitive     TRIMETH/SULFA <=20 SENSITIVE Sensitive     AMPICILLIN/SULBACTAM <=2 SENSITIVE Sensitive     PIP/TAZO <=4 SENSITIVE Sensitive     Extended ESBL NEGATIVE Sensitive     * 90,000 COLONIES/mL ESCHERICHIA COLI  MRSA PCR Screening     Status: None   Collection Time: 07/09/2018  7:13 PM  Result Value Ref Range Status   MRSA by PCR NEGATIVE NEGATIVE Final    Comment:        The GeneXpert MRSA Assay (FDA approved for NASAL specimens only), is one component of a comprehensive MRSA colonization surveillance program. It is not intended to diagnose MRSA infection nor to guide or monitor treatment for MRSA infections. Performed at [facility], [address], [place], [place] [address]   Culture, blood (routine x 2)     Status: None   Collection Time: 07/05/2018  9:04 PM  Result Value Ref Range Status   Specimen Description BLOOD LEFT HAND  Final   Special Requests   Final    BOTTLES DRAWN AEROBIC AND ANAEROBIC Blood Culture adequate volume   Culture   Final    NO GROWTH 5 DAYS Performed at [facility], [address], [place], [place] [address]    Report Status 06/24/2018 FINAL  Final  Culture, blood (routine x 2)     Status: None   Collection Time: 06/16/2018  9:05 PM  Result Value Ref Range Status   Specimen Description BLOOD RIGHT HAND  Final   Special Requests   Final    BOTTLES DRAWN AEROBIC AND ANAEROBIC Blood Culture adequate volume   Culture   Final    NO GROWTH 5 DAYS Performed at [facility], [address], [place], [place] [address]    Report Status 06/24/2018 FINAL   Final     Scheduled Meds:  aspirin  300 mg Rectal Daily   carbidopa-levodopa  1 tablet Oral TID   chlorhexidine gluconate (MEDLINE KIT)  15 mL Mouth Rinse BID   Chlorhexidine Gluconate Cloth  6 each Topical Q0600   cyanocobalamin  1,000 mcg Intramuscular Daily   feeding supplement (OSMOLITE 1.5 CAL)  1,000 mL Per Tube Q24H  furosemide  40 mg Intravenous Once   insulin aspart  0-9 Units Subcutaneous Q4H   levothyroxine  50 mcg Intravenous Daily   mouth rinse  15 mL Mouth Rinse 10 times per day   metoCLOPramide (REGLAN) injection  5 mg Intravenous Q8H   rotigotine  1 patch Transdermal Daily   Continuous Infusions:  dextrose 5 % and 0.45 % NaCl with KCl 20 mEq/L Stopped (06/24/18 1643)   famotidine (PEPCID) IV Stopped (06/24/18 1110)   levETIRAcetam Stopped (06/24/18 0855)   metronidazole      Procedures/Studies: Dg Abd 1 View  Result Date: 06/21/2018 CLINICAL DATA:  Check gastric catheter placement EXAM: ABDOMEN - 1 VIEW COMPARISON:  None. FINDINGS: Scattered large and small bowel gas is noted. Degenerative changes of lumbar spine are noted with scoliosis concave to the right. Check gastric catheter is noted in the mid esophagus at the superior aspect of the film. This could be advanced several cm to reach the stomach. IMPRESSION: Gastric catheter in the mid esophagus. Electronically Signed   By: Inez Catalina M.D.   On: 06/21/2018 15:47   Ct Head Wo Contrast  Result Date: 06/20/2018 CLINICAL DATA:  85 year old with altered mental status. EXAM: CT HEAD WITHOUT CONTRAST TECHNIQUE: Contiguous axial images were obtained from the base of the skull through the vertex without intravenous contrast. COMPARISON:  06/16/2018 FINDINGS: Brain: Stable cerebral atrophy. Negative for acute hemorrhage, mass lesion, midline shift, hydrocephalus or large infarct. Again noted are hypodensities in the white matter suggesting chronic changes. Focal low-density in the right frontal-parietal  white matter is compatible with old ischemic changes. Patient's head rotated on this examination. Vascular: No hyperdense vessel or unexpected calcification. Skull: Normal. Negative for fracture or focal lesion. Sinuses/Orbits: No acute finding. Other: None. IMPRESSION: 1. No acute intracranial abnormality. 2. Stable atrophy and chronic white matter changes. Findings are most compatible with chronic small vessel ischemic changes. Electronically Signed   By: Markus Daft M.D.   On: 06/20/2018 15:55   Ct Head Wo Contrast  Result Date: 06/25/2018 CLINICAL DATA:  85 year old female with a history of altered mental status EXAM: CT HEAD WITHOUT CONTRAST TECHNIQUE: Contiguous axial images were obtained from the base of the skull through the vertex without intravenous contrast. COMPARISON:  12/15/2012 FINDINGS: Brain: No acute intracranial hemorrhage. No midline shift or mass effect. Gray-white differentiation maintained. Progression of senescent volume loss. Patchy hypodensities in the periventricular white matter. Unremarkable appearance of the ventricular system. Vascular: Dense calcifications of anterior and posterior circulation. Skull: No acute fracture.  No aggressive bone lesion identified. Sinuses/Orbits: Unremarkable appearance of the orbits. Mastoid air cells clear. No middle ear effusion. No significant sinus disease. Other: Endotracheal tube in position.  Fluid in the nasopharynx IMPRESSION: Negative for acute intracranial abnormality. Senescent volume loss and evidence of chronic microvascular ischemic disease. Electronically Signed   By: Corrie Mckusick D.O.   On: 07/06/2018 14:11   Dg Chest Port 1 View  Result Date: 06/24/2018 CLINICAL DATA:  Followup ventilator support EXAM: PORTABLE CHEST 1 VIEW COMPARISON:  06/22/2018 FINDINGS: Endotracheal tube tip is 3 cm above the carina. Orogastric or nasogastric tube extends at least to the distal esophagus. Right-sided central line tip at the SVC RA junction. The  patient is rotated towards the right. The left lung appears clear. There appears to be volume loss and infiltrate in the right lung. IMPRESSION: Patient rotated towards the right. Possible worsening of infiltrate in volume loss in the right lung. Orogastric or nasogastric tube tip not  clearly seen due to underpenetration. Electronically Signed   By: Nelson Chimes M.D.   On: 06/24/2018 10:40   Dg Chest Port 1 View  Result Date: 06/22/2018 CLINICAL DATA:  Respiratory failure. EXAM: PORTABLE CHEST 1 VIEW COMPARISON:  06/21/2018 FINDINGS: The patient is rotated to the left. A right PICC has been placed and terminates over the lower SVC. An endotracheal tube terminates just below the clavicles, unchanged. An enteric tube is suboptimally visualized though now appears to extend into the upper abdomen. The cardiomediastinal silhouette is unchanged. Lung volumes remain low without evidence of airspace consolidation, edema, pleural effusion, pneumothorax. IMPRESSION: 1. Support devices including new right PICC as above. 2. Low lung volumes without evidence of acute airspace disease. Electronically Signed   By: Logan Bores M.D.   On: 06/22/2018 08:16   Dg Chest Port 1 View  Result Date: 06/21/2018 CLINICAL DATA:  Respiratory failure. EXAM: PORTABLE CHEST 1 VIEW COMPARISON:  Radiograph of Jun 20, 2018. FINDINGS: The heart size and mediastinal contours are within normal limits. Endotracheal tube is unchanged in position. Nasogastric tube appears to be looped within the distal esophagus with tip directed back up into the more proximal esophagus. No acute pulmonary disease is noted. No pneumothorax or pleural effusion is noted. Atherosclerosis of thoracic aorta is noted. The visualized skeletal structures are unremarkable. IMPRESSION: Endotracheal tube is unchanged in position. Nasogastric tube is looped within distal esophagus, with distal tip directed into the more proximal esophagus. No acute cardiopulmonary abnormality  seen. Aortic Atherosclerosis (ICD10-I70.0). Electronically Signed   By: Marijo Conception M.D.   On: 06/21/2018 08:48   Dg Chest Port 1 View  Result Date: 06/20/2018 CLINICAL DATA:  85 year old female with repositioning of the endotracheal tube EXAM: PORTABLE CHEST 1 VIEW COMPARISON:  06/20/2018 FINDINGS: Cardiomediastinal silhouette unchanged in size and contour. No evidence of central vascular congestion. The endotracheal tube has been slightly withdrawn, now terminating 2.7 cm above the carina. There is a linear contour of the right lung of uncertain significance. There are overlying surgical lines tubes in EKG leads of the right chest somewhat obscuring evaluation. Low lung volumes. Pleuroparenchymal opacity of the right lung, may represent chronic changes and some pleural fluid. No new airspace disease. IMPRESSION: Endotracheal tube has been slightly withdrawn now terminating 2.7 cm above the carina. There is linear contour of the right lung extending in a convex configuration towards the costophrenic angle. While this is favored to represent artifact, a right pneumothorax cannot be excluded. Correlation with physical exam may be useful, as well as a repeat chest x-ray with adequate positioning and removal of the obscuring surgical line/hardware and EKG leads. These results were discussed by telephone at the time of interpretation on 06/20/2018 at 9:21 am with the nurse caring for the patient, Ms Sherry Ruffing. Electronically Signed   By: Corrie Mckusick D.O.   On: 06/20/2018 09:21   Dg Chest Port 1 View  Result Date: 06/20/2018 CLINICAL DATA:  Unresponsive. EXAM: PORTABLE CHEST 1 VIEW COMPARISON:  Radiograph Jun 19, 2018. FINDINGS: The heart size and mediastinal contours are within normal limits. Atherosclerosis of thoracic aorta is noted. Endotracheal tube is directed into right mainstem bronchus; withdrawal by 3-4 cm is recommended. Both lungs are clear. The visualized skeletal structures are unremarkable.  IMPRESSION: Endotracheal tube directed into right mainstem bronchus; withdrawal by 3-4 cm is recommended. No acute cardiopulmonary abnormality seen. These results will be called to the ordering clinician or representative by the Radiologist Assistant, and communication documented in  the PACS or zVision Dashboard. Electronically Signed   By: Marijo Conception M.D.   On: 06/20/2018 08:16   Dg Chest Portable 1 View  Result Date: 06/28/2018 CLINICAL DATA:  Unresponsive. EXAM: PORTABLE CHEST 1 VIEW COMPARISON:  06/27/2010 FINDINGS: 1301 hours. Endotracheal tube tip is approximately 1.4 cm above the base of the carina. The lungs are clear without focal pneumonia, edema, pneumothorax or pleural effusion. The cardiopericardial silhouette is within normal limits for size. The visualized bony structures of the thorax are intact. Telemetry leads overlie the chest. IMPRESSION: No active disease. Electronically Signed   By: Misty Stanley M.D.   On: 06/22/2018 13:23   Dg Chest Port 1v Same Day  Result Date: 06/20/2018 CLINICAL DATA:  Intubated patient. Possible pneumothorax on plain film of the chest earlier today. EXAM: PORTABLE CHEST 1 VIEW COMPARISON:  Single-view of the chest earlier today. FINDINGS: Endotracheal tube is in place with the tip well above the carina. There is no pneumothorax. Finding described on the prior report was likely a skin fold. Lungs clear. Heart size normal. IMPRESSION: Negative for pneumothorax.  No acute disease. ETT in good position. Electronically Signed   By: Inge Rise M.D.   On: 06/20/2018 10:16   Dg Addison Bailey G Tube Plc W/fl W/rad  Result Date: 06/23/2018 CLINICAL DATA:  Difficulty advancing OG tube into the stomach EXAM: NASO G TUBE PLACEMENT WITH FL AND WITH RAD CONTRAST:  20 mL Omnipaque 300 FLUOROSCOPY TIME:  Fluoroscopy Time:  5 seconds Radiation Exposure Index (if provided by the fluoroscopic device): Number of Acquired Spot Images: 0 COMPARISON:  Abdomen film Jun 21, 2018  FINDINGS: Fluoroscopy over the lower chest and upper abdomen demonstrates the tube to have advanced since prior KUB with the tip in the proximal stomach. This was advanced slightly further with the tip now in the mid to distal stomach. Contrast was injected to confirm placement. IMPRESSION: OG tube advanced into the mid to distal stomach. Electronically Signed   By: Rolm Baptise M.D.   On: 06/23/2018 15:49    Orson Eva, DO  [facility] Pager [phone]  If 7PM-7AM, please contact night-coverage [email or web address] Password [email or web address] 06/24/2018, 5:20 PM   LOS: 5 days

## 2018-06-24 NOTE — Progress Notes (Signed)
Nutrition Follow-up   DOCUMENTATION CODES:     INTERVENTION:   Recommend:  Start Osmolite 1.5 @ 40 ml /hr -provides 1440 kcal, 60 gr protein and 731 ml water. Add ProStat 30 ml daily via tube to meet est protein goal.    NUTRITION DIAGNOSIS:   Inadequate oral intake related to inability to eat as evidenced by estimated needs, NPO and mechanically ventilated- day 4 .   GOAL:   Provide needs based on ASPEN/SCCM guidelines  MONITOR:   Vent status  REASON FOR ASSESSMENT:   Ventilator    ASSESSMENT: Patient has been intubated for airway protection after being found at home by her granddaughter in an unresponsive state. Acute encephalopathy. Patient history includes: HTN, DM-2, Parkinson's, TIA, GERD and UTI's.   Multiple attempts to place NG/OG unsuccessful and radiologist not available to assist until Monday. Discussed pt with RN. -EEG results-   5/11-F/U -pt remains unresponsive and intubated ( d4). Consult for New TNA received. However, it would be beneficial for patient to feed enterally if at all possible. IR has been consulted by MD for tube placement. Communicated with pharmacy and MD. RD will d/c TNA consult for now. Please re-consult if needed. Tube feeding recommendations placed above.   5/12- OG placed and confirmed yesterday with assistance of fluoroscopy. Tube feeding recommendations provided. Patient is day 5 admission. Palliative reviewed GOC. Her weight is trending up and output noted.   MV: 9.8 L/min Temp (24hrs), Avg:99.4 F (37.4 C), Min:98.8 F (37.1 C), Max:100.4 F (38 C) Sedative-fentynl  Patient weighed 77 kg last year in early April. Unclear at this time whether her weight loss has been gradual- but she is down 22% compared to 13 months ago which is clinically relevant.  Medications reviewed and include: vitamin B-12, SSI, Sinemet, levothyroxine, Pepcid, Keppra, Rocephin  IVF- D5/ 0.45% NS w/KCL 52mEq/l  @ 75 ml /hr   Intake/Output Summary  (Last 24 hours) at 06/24/2018 1006 Last data filed at 06/24/2018 0656 Gross per 24 hour  Intake 2574.21 ml  Output 500 ml  Net 2074.21 ml     Labs: BMP Latest Ref Rng & Units 06/24/2018 06/23/2018 06/22/2018  Glucose 70 - 99 mg/dL 031(Y) 811(W) 867(R)  BUN 8 - 23 mg/dL 15 15 18   Creatinine 0.44 - 1.00 mg/dL 3.73 6.68 1.59  Sodium 135 - 145 mmol/L 135 139 145  Potassium 3.5 - 5.1 mmol/L 4.2 4.1 3.4(L)  Chloride 98 - 111 mmol/L 109 112(H) 116(H)  CO2 22 - 32 mmol/L 19(L) 20(L) 21(L)  Calcium 8.9 - 10.3 mg/dL 8.2(L) 8.7(L) 9.1     NUTRITION - FOCUSED PHYSICAL EXAM:  Unable to complete Nutrition-Focused physical exam at this time.    Diet Order:   Diet Order    None      EDUCATION NEEDS:   Not appropriate for education at this time   Skin:  Skin Assessment: Reviewed RN Assessment  Last BM:  5/7  Height:   Ht Readings from Last 1 Encounters:  06/23/18 5\' 3"  (1.6 m)    Weight:   Wt Readings from Last 1 Encounters:  06/24/18 67.4 kg  5/8 -60.4 kg   Ideal Body Weight:  52 kg  BMI:  Body mass index is 26.32 kg/m.  Estimated Nutritional Needs:   Kcal:  1446   Protein:  68-78 (1.3-1.5 gr/kg/bw)  Fluid:  per MD  Royann Shivers MS,RD,CSG,LDN Office: [phone] Pager: [phone]

## 2018-06-25 ENCOUNTER — Inpatient Hospital Stay (HOSPITAL_COMMUNITY): Payer: Medicare Other

## 2018-06-25 DIAGNOSIS — Z4659 Encounter for fitting and adjustment of other gastrointestinal appliance and device: Secondary | ICD-10-CM

## 2018-06-25 DIAGNOSIS — J189 Pneumonia, unspecified organism: Secondary | ICD-10-CM

## 2018-06-25 LAB — GLUCOSE, CAPILLARY
Glucose-Capillary: 206 mg/dL — ABNORMAL HIGH (ref 70–99)
Glucose-Capillary: 237 mg/dL — ABNORMAL HIGH (ref 70–99)
Glucose-Capillary: 229 mg/dL — ABNORMAL HIGH (ref 70–99)
Glucose-Capillary: 241 mg/dL — ABNORMAL HIGH (ref 70–99)
Glucose-Capillary: 201 mg/dL — ABNORMAL HIGH (ref 70–99)

## 2018-06-25 LAB — CBC
HCT: 31.3 % — ABNORMAL LOW (ref 36.0–46.0)
Hemoglobin: 10.4 g/dL — ABNORMAL LOW (ref 12.0–15.0)
MCH: 30.2 pg (ref 26.0–34.0)
MCHC: 33.2 g/dL (ref 30.0–36.0)
MCV: 91 fL (ref 80.0–100.0)
Platelets: 162 10*3/uL (ref 150–400)
RBC: 3.44 MIL/uL — ABNORMAL LOW (ref 3.87–5.11)
RDW: 13.9 % (ref 11.5–15.5)
WBC: 13.5 10*3/uL — ABNORMAL HIGH (ref 4.0–10.5)
nRBC: 0 % (ref 0.0–0.2)

## 2018-06-25 LAB — BLOOD GAS, ARTERIAL
Acid-base deficit: 2.5 mmol/L — ABNORMAL HIGH (ref 0.0–2.0)
Bicarbonate: 22.7 mmol/L (ref 20.0–28.0)
FIO2: 40
O2 Saturation: 98.3 %
Patient temperature: 37.8
pCO2 arterial: 31.4 mmHg — ABNORMAL LOW (ref 32.0–48.0)
pH, Arterial: 7.44 (ref 7.350–7.450)
pO2, Arterial: 129 mmHg — ABNORMAL HIGH (ref 83.0–108.0)

## 2018-06-25 LAB — BASIC METABOLIC PANEL
Anion gap: 8 (ref 5–15)
BUN: 20 mg/dL (ref 8–23)
CO2: 20 mmol/L — ABNORMAL LOW (ref 22–32)
Calcium: 8.7 mg/dL — ABNORMAL LOW (ref 8.9–10.3)
Chloride: 107 mmol/L (ref 98–111)
Creatinine, Ser: 0.82 mg/dL (ref 0.44–1.00)
GFR calc Af Amer: >60 mL/min (ref >60)
GFR calc non Af Amer: >60 mL/min (ref >60)
Glucose, Bld: 291 mg/dL — ABNORMAL HIGH (ref 70–99)
Potassium: 4.8 mmol/L (ref 3.5–5.1)
Sodium: 135 mmol/L (ref 135–145)

## 2018-06-25 LAB — PROCALCITONIN: Procalcitonin: 0.69 ng/mL

## 2018-06-25 MED ORDER — INSULIN ASPART 100 UNIT/ML ~~LOC~~ SOLN
2.0000 [IU] | SUBCUTANEOUS | Status: DC
  Administered 2018-06-25 – 2018-06-28 (×15): 2 [IU] via SUBCUTANEOUS

## 2018-06-25 NOTE — Progress Notes (Signed)
Palliative:  Jennifer [name] continues on vent. She continues to NOT follow commands but is having some non-purposeful movement of her feet L > R. Eyes still deviate to the right.   I spoke with daughter, Jennifer [name], via telephone again. I explained my concern that her mother continues with no improvement even with adequate medications for Parkinson's disease. Also explained that although CT shows no stroke we are unable to complete MRI while she is intubated. We discussed likely need for transition to comfort. Jennifer [name] is saddened and still trying to hold onto some home. I told her that if there is no improvement tomorrow then we need to discuss how to best care for her mother. She understands and talks about her father dying just months ago and being an only child. She understands the need for possible transition to comfort but dreads making these decisions.   All questions/concerns addressed. Emotional support provided. I will follow up tomorrow to continue conversations and planning with daughter.   Plan: - Will continue conversations with daughter, Jennifer [name], tomorrow regarding GOC and expectations.   30 min  Yong Channel, NP Palliative Medicine Team Pager # [phone] (M-F 8a-5p) Team Phone # [phone] (Nights/Weekends)

## 2018-06-25 NOTE — Care Management Important Message (Signed)
Important Message  Patient Details  Name: Jennifer [name] MRN: [id number] Date of Birth: 02-16-1930   Medicare Important Message Given:  Yes    Corey Harold 06/25/2018, 3:25 PM

## 2018-06-25 NOTE — Progress Notes (Signed)
Subjective: She is a little more alert at least moving around a little bit.  She remains intubated on the ventilator  Objective: Vital signs in last 24 hours: Temp:  [99.1 F (37.3 C)-101.5 F (38.6 C)] 100 F (37.8 C) (05/13 0800) Pulse Rate:  [82-120] 99 (05/13 0200) Resp:  [15-38] 21 (05/13 0800) BP: (100-173)/(36-65) 145/56 (05/13 0800) SpO2:  [93 %-100 %] 97 % (05/13 0200) FiO2 (%):  [40 %-50 %] 40 % (05/13 0423) Weight:  [67.5 kg] 67.5 kg (05/13 0500) Weight change: 0.1 kg    Intake/Output from previous day: 05/12 0701 - 05/13 0700 In: 1997 [I.V.:669.7; NG/GT:777.3; IV Piggyback:549.9] Out: 1300 [Urine:1300]  PHYSICAL EXAM General appearance: Intubated sedated on mechanical ventilation Resp: rhonchi bilaterally Cardio: regular rate and rhythm, S1, S2 normal, no murmur, click, rub or gallop GI: soft, non-tender; bowel sounds normal; no masses,  no organomegaly Extremities: extremities normal, atraumatic, no cyanosis or edema  Lab Results:  Results for orders placed or performed during the hospital encounter of 07/04/2018 (from the past 48 hour(s))  Glucose, capillary     Status: Abnormal   Collection Time: 06/23/18 11:28 AM  Result Value Ref Range   Glucose-Capillary 137 (H) 70 - 99 mg/dL  Glucose, capillary     Status: None   Collection Time: 06/23/18  4:20 PM  Result Value Ref Range   Glucose-Capillary 76 70 - 99 mg/dL  Glucose, capillary     Status: Abnormal   Collection Time: 06/23/18  7:48 PM  Result Value Ref Range   Glucose-Capillary 199 (H) 70 - 99 mg/dL  Glucose, capillary     Status: Abnormal   Collection Time: 06/24/18 12:25 AM  Result Value Ref Range   Glucose-Capillary 300 (H) 70 - 99 mg/dL  Comprehensive metabolic panel     Status: Abnormal   Collection Time: 06/24/18  4:09 AM  Result Value Ref Range   Sodium 135 135 - 145 mmol/L   Potassium 4.2 3.5 - 5.1 mmol/L   Chloride 109 98 - 111 mmol/L   CO2 19 (L) 22 - 32 mmol/L   Glucose, Bld 261 (H)  70 - 99 mg/dL   BUN 15 8 - 23 mg/dL   Creatinine, Ser 0.71 0.44 - 1.00 mg/dL   Calcium 8.2 (L) 8.9 - 10.3 mg/dL   Total Protein 4.9 (L) 6.5 - 8.1 g/dL   Albumin 2.1 (L) 3.5 - 5.0 g/dL   AST 25 15 - 41 U/L   ALT 9 0 - 44 U/L   Alkaline Phosphatase 59 38 - 126 U/L   Total Bilirubin 0.4 0.3 - 1.2 mg/dL   GFR calc non Af Amer >60 >60 mL/min   GFR calc Af Amer >60 >60 mL/min   Anion gap 7 5 - 15    Comment: Performed at [facility], [address], [place], [place] [address]  CBC     Status: Abnormal   Collection Time: 06/24/18  4:09 AM  Result Value Ref Range   WBC 6.9 4.0 - 10.5 K/uL   RBC 3.05 (L) 3.87 - 5.11 MIL/uL   Hemoglobin 9.0 (L) 12.0 - 15.0 g/dL   HCT 28.5 (L) 36.0 - 46.0 %   MCV 93.4 80.0 - 100.0 fL   MCH 29.5 26.0 - 34.0 pg   MCHC 31.6 30.0 - 36.0 g/dL   RDW 14.2 11.5 - 15.5 %   Platelets 145 (L) 150 - 400 K/uL   nRBC 0.0 0.0 - 0.2 %    Comment: Performed  at [facility], [address] [place], [place] [address]  CK     Status: Abnormal   Collection Time: 06/24/18  4:09 AM  Result Value Ref Range   Total CK 414 (H) 38 - 234 U/L    Comment: Performed at [facility], [address] [place], [place] [address]  Glucose, capillary     Status: Abnormal   Collection Time: 06/24/18  4:22 AM  Result Value Ref Range   Glucose-Capillary 232 (H) 70 - 99 mg/dL  Blood gas, arterial     Status: Abnormal   Collection Time: 06/24/18  5:00 AM  Result Value Ref Range   FIO2 55.00    pH, Arterial 7.340 (L) 7.350 - 7.450   pCO2 arterial 36.2 32.0 - 48.0 mmHg   pO2, Arterial 67.0 (L) 83.0 - 108.0 mmHg   Bicarbonate 19.7 (L) 20.0 - 28.0 mmol/L   Acid-base deficit 5.7 (H) 0.0 - 2.0 mmol/L   O2 Saturation 90.3 %   Patient temperature 37.0    Allens test (pass/fail) PASS PASS    Comment: Performed at [facility], [address] [place], [place] [address]  Urinalysis, Complete w Microscopic     Status: Abnormal   Collection Time: 06/24/18  5:46 AM  Result Value Ref Range   Color,  Urine YELLOW YELLOW   APPearance CLOUDY (A) CLEAR   Specific Gravity, Urine 1.012 1.005 - 1.030   pH 5.0 5.0 - 8.0   Glucose, UA 150 (A) NEGATIVE mg/dL   Hgb urine dipstick SMALL (A) NEGATIVE   Bilirubin Urine NEGATIVE NEGATIVE   Ketones, ur NEGATIVE NEGATIVE mg/dL   Protein, ur NEGATIVE NEGATIVE mg/dL   Nitrite NEGATIVE NEGATIVE   Leukocytes,Ua LARGE (A) NEGATIVE   RBC / HPF 6-10 0 - 5 RBC/hpf   WBC, UA >50 (H) 0 - 5 WBC/hpf   Bacteria, UA NONE SEEN NONE SEEN   Squamous Epithelial / LPF 0-5 0 - 5   Mucus PRESENT    Budding Yeast PRESENT    Hyaline Casts, UA PRESENT    Ca Oxalate Crys, UA PRESENT    Non Squamous Epithelial 0-5 (A) NONE SEEN    Comment: Performed at [facility], [address] [place],  [address]  Glucose, capillary     Status: Abnormal   Collection Time: 06/24/18  7:54 AM  Result Value Ref Range   Glucose-Capillary 200 (H) 70 - 99 mg/dL  Glucose, capillary     Status: Abnormal   Collection Time: 06/24/18 11:41 AM  Result Value Ref Range   Glucose-Capillary 210 (H) 70 - 99 mg/dL  Glucose, capillary     Status: Abnormal   Collection Time: 06/24/18  3:51 PM  Result Value Ref Range   Glucose-Capillary 249 (H) 70 - 99 mg/dL   Comment 1 Notify RN    Comment 2 Document in Chart   Procalcitonin - Baseline     Status: None   Collection Time: 06/24/18  5:28 PM  Result Value Ref Range   Procalcitonin <0.10 ng/mL    Comment:        Interpretation: PCT (Procalcitonin) <= 0.5 ng/mL: Systemic infection (sepsis) is not likely. Local bacterial infection is possible. (NOTE)       Sepsis PCT Algorithm           Lower Respiratory Tract  Infection PCT Algorithm    ----------------------------     ----------------------------         PCT < 0.25 ng/mL                PCT < 0.10 ng/mL         Strongly encourage             Strongly discourage   discontinuation of antibiotics    initiation of antibiotics     ----------------------------     -----------------------------       PCT 0.25 - 0.50 ng/mL            PCT 0.10 - 0.25 ng/mL               OR       >80% decrease in PCT            Discourage initiation of                                            antibiotics      Encourage discontinuation           of antibiotics    ----------------------------     -----------------------------         PCT >= 0.50 ng/mL              PCT 0.26 - 0.50 ng/mL               AND        <80% decrease in PCT             Encourage initiation of                                             antibiotics       Encourage continuation           of antibiotics    ----------------------------     -----------------------------        PCT >= 0.50 ng/mL                  PCT > 0.50 ng/mL               AND         increase in PCT                  Strongly encourage                                      initiation of antibiotics    Strongly encourage escalation           of antibiotics                                     -----------------------------                                           PCT <= 0.25 ng/mL  OR                                        > 80% decrease in PCT                                     Discontinue / Do not initiate                                             antibiotics Performed at [facility], [address], [place], [place] [address]   Glucose, capillary     Status: Abnormal   Collection Time: 06/24/18  7:36 PM  Result Value Ref Range   Glucose-Capillary 235 (H) 70 - 99 mg/dL  Glucose, capillary     Status: Abnormal   Collection Time: 06/24/18 11:44 PM  Result Value Ref Range   Glucose-Capillary 258 (H) 70 - 99 mg/dL  Blood gas, arterial     Status: Abnormal   Collection Time: 06/25/18  4:30 AM  Result Value Ref Range   FIO2 40.00    pH, Arterial 7.440 7.350 - 7.450   pCO2 arterial 31.4 (L) 32.0 - 48.0 mmHg   pO2, Arterial 129 (H) 83.0 -  108.0 mmHg   Bicarbonate 22.7 20.0 - 28.0 mmol/L   Acid-base deficit 2.5 (H) 0.0 - 2.0 mmol/L   O2 Saturation 98.3 %   Patient temperature 37.8    Allens test (pass/fail) PASS PASS    Comment: Performed at [facility], [address], [place], [place] [address]  Glucose, capillary     Status: Abnormal   Collection Time: 06/25/18  4:33 AM  Result Value Ref Range   Glucose-Capillary 206 (H) 70 - 99 mg/dL  Procalcitonin     Status: None   Collection Time: 06/25/18  5:19 AM  Result Value Ref Range   Procalcitonin 0.69 ng/mL    Comment:        Interpretation: PCT > 0.5 ng/mL and <= 2 ng/mL: Systemic infection (sepsis) is possible, but other conditions are known to elevate PCT as well. (NOTE)       Sepsis PCT Algorithm           Lower Respiratory Tract                                      Infection PCT Algorithm    ----------------------------     ----------------------------         PCT < 0.25 ng/mL                PCT < 0.10 ng/mL         Strongly encourage             Strongly discourage   discontinuation of antibiotics    initiation of antibiotics    ----------------------------     -----------------------------       PCT 0.25 - 0.50 ng/mL            PCT 0.10 - 0.25 ng/mL               OR       >  80% decrease in PCT            Discourage initiation of                                            antibiotics      Encourage discontinuation           of antibiotics    ----------------------------     -----------------------------         PCT >= 0.50 ng/mL              PCT 0.26 - 0.50 ng/mL                AND       <80% decrease in PCT             Encourage initiation of                                             antibiotics       Encourage continuation           of antibiotics    ----------------------------     -----------------------------        PCT >= 0.50 ng/mL                  PCT > 0.50 ng/mL               AND         increase in PCT                  Strongly encourage                                       initiation of antibiotics    Strongly encourage escalation           of antibiotics                                     -----------------------------                                           PCT <= 0.25 ng/mL                                                 OR                                        > 80% decrease in PCT                                     Discontinue / Do not initiate  antibiotics Performed at [facility], [address], [place], [place] [address]   Basic metabolic panel     Status: Abnormal   Collection Time: 06/25/18  5:19 AM  Result Value Ref Range   Sodium 135 135 - 145 mmol/L   Potassium 4.8 3.5 - 5.1 mmol/L   Chloride 107 98 - 111 mmol/L   CO2 20 (L) 22 - 32 mmol/L   Glucose, Bld 291 (H) 70 - 99 mg/dL   BUN 20 8 - 23 mg/dL   Creatinine, Ser 0.82 0.44 - 1.00 mg/dL   Calcium 8.7 (L) 8.9 - 10.3 mg/dL   GFR calc non Af Amer >60 >60 mL/min   GFR calc Af Amer >60 >60 mL/min   Anion gap 8 5 - 15    Comment: Performed at [facility], [address] [place], [place] [address]  CBC     Status: Abnormal   Collection Time: 06/25/18  5:19 AM  Result Value Ref Range   WBC 13.5 (H) 4.0 - 10.5 K/uL   RBC 3.44 (L) 3.87 - 5.11 MIL/uL   Hemoglobin 10.4 (L) 12.0 - 15.0 g/dL   HCT 31.3 (L) 36.0 - 46.0 %   MCV 91.0 80.0 - 100.0 fL   MCH 30.2 26.0 - 34.0 pg   MCHC 33.2 30.0 - 36.0 g/dL   RDW 13.9 11.5 - 15.5 %   Platelets 162 150 - 400 K/uL   nRBC 0.0 0.0 - 0.2 %    Comment: Performed at [facility], [address], [place],  [address]  Glucose, capillary     Status: Abnormal   Collection Time: 06/25/18  7:51 AM  Result Value Ref Range   Glucose-Capillary 237 (H) 70 - 99 mg/dL    ABGS Recent Labs    06/25/18 0430  PHART 7.440  PO2ART 129*  HCO3 22.7   CULTURES Recent Results (from the past 240 hour(s))  SARS Coronavirus 2 (CEPHEID - Performed in [facility] hospital lab),  Hosp Order     Status: None   Collection Time: 06/29/2018 12:15 PM  Result Value Ref Range Status   SARS Coronavirus 2 NEGATIVE NEGATIVE Final    Comment: (NOTE) If result is NEGATIVE SARS-CoV-2 target nucleic acids are NOT DETECTED. The SARS-CoV-2 RNA is generally detectable in upper and lower  respiratory specimens during the acute phase of infection. The lowest  concentration of SARS-CoV-2 viral copies this assay can detect is 250  copies / mL. A negative result does not preclude SARS-CoV-2 infection  and should not be used as the sole basis for treatment or other  patient management decisions.  A negative result may occur with  improper specimen collection / handling, submission of specimen other  than nasopharyngeal swab, presence of viral mutation(s) within the  areas targeted by this assay, and inadequate number of viral copies  (<250 copies / mL). A negative result must be combined with clinical  observations, patient history, and epidemiological information. If result is POSITIVE SARS-CoV-2 target nucleic acids are DETECTED. The SARS-CoV-2 RNA is generally detectable in upper and lower  respiratory specimens dur ing the acute phase of infection.  Positive  results are indicative of active infection with SARS-CoV-2.  Clinical  correlation with patient history and other diagnostic information is  necessary to determine patient infection status.  Positive results do  not rule out bacterial infection or co-infection with other viruses. If result is PRESUMPTIVE POSTIVE SARS-CoV-2 nucleic acids MAY BE PRESENT.   A presumptive positive result  was obtained on the submitted specimen  and confirmed on repeat testing.  While 2019 novel coronavirus  (SARS-CoV-2) nucleic acids may be present in the submitted sample  additional confirmatory testing may be necessary for epidemiological  and / or clinical management purposes  to differentiate between  SARS-CoV-2 and other Sarbecovirus  currently known to infect humans.  If clinically indicated additional testing with an alternate test  methodology [phone]) is advised. The SARS-CoV-2 RNA is generally  detectable in upper and lower respiratory sp ecimens during the acute  phase of infection. The expected result is Negative. Fact Sheet for Patients:  StrictlyIdeas.no Fact Sheet for Healthcare Providers: BankingDealers.co.za This test is not yet approved or cleared by the [place] FDA and has been authorized for detection and/or diagnosis of SARS-CoV-2 by FDA under an Emergency Use Authorization (EUA).  This EUA will remain in effect (meaning this test can be used) for the duration of the COVID-19 declaration under Section 564(b)(1) of the Act, 21 U.S.C. section 360bbb-3(b)(1), unless the authorization is terminated or revoked sooner. Performed at [facility], [address], [place], [place] [address]   Urine culture     Status: Abnormal   Collection Time: 06/18/2018  2:38 PM  Result Value Ref Range Status   Specimen Description   Final    URINE, CLEAN CATCH Performed at [facility], [address], [place], [place] [address]    Special Requests   Final    NONE Performed at [facility], [address], [place], [place] [address]    Culture 90,000 COLONIES/mL ESCHERICHIA COLI (A)  Final   Report Status 06/21/2018 FINAL  Final   Organism ID, Bacteria ESCHERICHIA COLI (A)  Final      Susceptibility   Escherichia coli - MIC*    AMPICILLIN 4 SENSITIVE Sensitive     CEFAZOLIN <=4 SENSITIVE Sensitive     CEFTRIAXONE <=1 SENSITIVE Sensitive     CIPROFLOXACIN >=4 RESISTANT Resistant     GENTAMICIN <=1 SENSITIVE Sensitive     IMIPENEM <=0.25 SENSITIVE Sensitive     NITROFURANTOIN <=16 SENSITIVE Sensitive     TRIMETH/SULFA <=20 SENSITIVE Sensitive     AMPICILLIN/SULBACTAM <=2 SENSITIVE Sensitive     PIP/TAZO <=4 SENSITIVE Sensitive     Extended ESBL NEGATIVE Sensitive      * 90,000 COLONIES/mL ESCHERICHIA COLI  MRSA PCR Screening     Status: None   Collection Time: 06/29/2018  7:13 PM  Result Value Ref Range Status   MRSA by PCR NEGATIVE NEGATIVE Final    Comment:        The GeneXpert MRSA Assay (FDA approved for NASAL specimens only), is one component of a comprehensive MRSA colonization surveillance program. It is not intended to diagnose MRSA infection nor to guide or monitor treatment for MRSA infections. Performed at [facility], [address] [place], [place] [address]   Culture, blood (routine x 2)     Status: None   Collection Time: 06/18/2018  9:04 PM  Result Value Ref Range Status   Specimen Description BLOOD LEFT HAND  Final   Special Requests   Final    BOTTLES DRAWN AEROBIC AND ANAEROBIC Blood Culture adequate volume   Culture   Final    NO GROWTH 5 DAYS Performed at [facility], [address], [place], [place] [address]    Report Status 06/24/2018 FINAL  Final  Culture, blood (routine x 2)     Status: None   Collection Time: 07/04/2018  9:05 PM  Result Value Ref Range Status   Specimen Description BLOOD RIGHT HAND  Final   Special Requests   Final    BOTTLES DRAWN AEROBIC AND ANAEROBIC Blood Culture adequate volume   Culture   Final    NO GROWTH 5 DAYS Performed at [facility], [address] [place],  [address]    Report Status 06/24/2018 FINAL  Final   Studies/Results: Dg Chest Port 1 View  Result Date: 06/25/2018 CLINICAL DATA:  85 year old female with encephalopathy, intubated. Negative for COVID-19 recently. EXAM: PORTABLE CHEST 1 VIEW COMPARISON:  06/24/2018 and earlier. FINDINGS: Portable AP upright views at 0554 hours. Stable endotracheal tube tip at the level the clavicles. Enteric tube courses to the abdomen, tip not included. Stable right PICC line. Mildly improved lung volumes. Stable cardiac size and mediastinal contours. No pneumothorax. Stable pulmonary vascularity without overt edema. No pleural  effusion. Mild patchy lung base opacity most resembles atelectasis. Paucity of bowel gas in the upper abdomen. No acute osseous abnormality identified. IMPRESSION: 1. Stable lines and tubes. 2. Improved lung volumes with mild lung base atelectasis. Electronically Signed   By: Genevie Ann M.D.   On: 06/25/2018 08:02   Dg Chest Port 1 View  Result Date: 06/24/2018 CLINICAL DATA:  Followup ventilator support EXAM: PORTABLE CHEST 1 VIEW COMPARISON:  06/22/2018 FINDINGS: Endotracheal tube tip is 3 cm above the carina. Orogastric or nasogastric tube extends at least to the distal esophagus. Right-sided central line tip at the SVC RA junction. The patient is rotated towards the right. The left lung appears clear. There appears to be volume loss and infiltrate in the right lung. IMPRESSION: Patient rotated towards the right. Possible worsening of infiltrate in volume loss in the right lung. Orogastric or nasogastric tube tip not clearly seen due to underpenetration. Electronically Signed   By: Nelson Chimes M.D.   On: 06/24/2018 10:40   Dg Addison Bailey G Tube Plc W/fl W/rad  Result Date: 06/23/2018 CLINICAL DATA:  Difficulty advancing OG tube into the stomach EXAM: NASO G TUBE PLACEMENT WITH FL AND WITH RAD CONTRAST:  20 mL Omnipaque 300 FLUOROSCOPY TIME:  Fluoroscopy Time:  5 seconds Radiation Exposure Index (if provided by the fluoroscopic device): Number of Acquired Spot Images: 0 COMPARISON:  Abdomen film Jun 21, 2018 FINDINGS: Fluoroscopy over the lower chest and upper abdomen demonstrates the tube to have advanced since prior KUB with the tip in the proximal stomach. This was advanced slightly further with the tip now in the mid to distal stomach. Contrast was injected to confirm placement. IMPRESSION: OG tube advanced into the mid to distal stomach. Electronically Signed   By: Rolm Baptise M.D.   On: 06/23/2018 15:49    Medications:  Prior to Admission:  Medications Prior to Admission  Medication Sig Dispense Refill  Last Dose  . acetaminophen (TYLENOL) 650 MG CR tablet Take 650 mg by mouth 2 (two) times daily as needed for pain.   Taking  . carbidopa-levodopa (SINEMET IR) 10-100 MG tablet Take 1 Table tby mouth 3 Times Daily As Directed for Parkinson's Disease   Taking  . clopidogrel (PLAVIX) 75 MG tablet Take 75 mg by mouth at bedtime.     at unknown  . furosemide (LASIX) 40 MG tablet 40 mg. Take 1/2tablet by mouth twice daily   Taking  . glipiZIDE (GLUCOTROL XL) 10 MG 24 hr tablet TAKE  (1)  TABLET BY MOUTH TWICE A DAY.   Taking  . levothyroxine (SYNTHROID) 100 MCG tablet Take  1 tablet by mouth daily.     Marland Kitchen linagliptin (TRADJENTA) 5 MG TABS tablet Take 5 mg by mouth daily.    Taking  . lisinopril (ZESTRIL) 20 MG tablet Take 1 tablet by mouth daily.    at unknown  . pramipexole (MIRAPEX) 0.75 MG tablet Take 0.75 mg by mouth 3 (three) times daily.   Taking  . pravastatin (PRAVACHOL) 40 MG tablet Take 1 tablet by mouth daily.   Taking  . pregabalin (LYRICA) 100 MG capsule Take 100 mg by mouth 3 (three) times daily.    Taking  . selegiline (ELDEPRYL) 5 MG capsule Take 5 mg by mouth 2 (two) times daily.    Taking  . Vitamin D, Ergocalciferol, (DRISDOL) 50000 UNITS CAPS capsule Take 1 capsule by mouth once a week.   Taking  . tiZANidine (ZANAFLEX) 4 MG tablet 1/2 or 1 Tablet by mout Up To 4 Times Per Day for Muscle Spasms. May Cause Drowsiness   Taking   Scheduled: . aspirin  300 mg Rectal Daily  . carbidopa-levodopa  2 tablet Oral TID  . chlorhexidine gluconate (MEDLINE KIT)  15 mL Mouth Rinse BID  . Chlorhexidine Gluconate Cloth  6 each Topical Q0600  . cyanocobalamin  1,000 mcg Intramuscular Daily  . feeding supplement (OSMOLITE 1.5 CAL)  1,000 mL Per Tube Q24H  . insulin aspart  0-9 Units Subcutaneous Q4H  . levothyroxine  50 mcg Intravenous Daily  . mouth rinse  15 mL Mouth Rinse 10 times per day  . metoCLOPramide (REGLAN) injection  5 mg Intravenous Q8H  . rotigotine  1 patch Transdermal Daily    Continuous: . ceFEPime (MAXIPIME) IV Stopped (06/25/18 0149)  . dextrose 5 % and 0.45 % NaCl with KCl 20 mEq/L Stopped (06/24/18 1643)  . famotidine (PEPCID) IV Stopped (06/24/18 1110)  . levETIRAcetam Stopped (06/24/18 2300)  . metronidazole Stopped (06/25/18 0427)   XIH:[id number], albuterol, fentaNYL (SUBLIMAZE) injection, fentaNYL (SUBLIMAZE) injection, ondansetron (ZOFRAN) IV  Assesment: She was admitted with altered mental status/acute encephalopathy which appears to be multifactorial.  She was down for an unknown period of time and had some rhabdomyolysis from that which has improved.  She has a urinary tract infection that may have caused some of the problem.  She was intubated for airway protection and she is still not able to protect her airway.  We had difficulty getting feeding tube in place but it is in place now and she is on tube feedings and she is getting her medicines by the tube now.  She has acute hypoxic respiratory failure and remains on the ventilator with no ability to extubate at this point because of her mental status  She has Parkinson's disease and she is back on her meds and that seems to be a little better per Dr. Merlene Laughter Active Problems:   Lower urinary tract infectious disease   Unresponsiveness   Acute encephalopathy   Acute respiratory failure with hypoxia ([place])   Rhabdomyolysis   Feeding difficulty in elderly   Goals of care, counseling/discussion   Palliative care encounter   Acute cystitis with hematuria    Plan: Continue ventilator support    LOS: 6 days   Alonza Bogus 06/25/2018, 8:22 AM

## 2018-06-25 NOTE — Progress Notes (Signed)
PROGRESS NOTE  Jennifer [name] QTM:[id number] DOB: 1930-11-18 DOA: 06/27/2018 PCP: [name], MD  Brief History: 85 year old female with a history of TIA, Parkinson's disease, hypothyroidism, hypertension, diabetes mellitus, hyperlipidemia, PSVT status post RFA 2006, and tobacco abuse in remission presented with unresponsiveness. The patient lives by herself, but her granddaughter intermittently checks up on her. On 06/18/2018, the patient granddaughter noted the patient to be lethargic, but felt that the patient was just tired and sleepy. She went back to check up on the patient on the morning of 07/01/2018 and noted the patient to be essentially unresponsive. EMS was activated. In the emergency department, the patient was intubated to protect her airway secondary to her encephalopathy. According to the patient's daughter, the patient has had a long history of poor compliance with her medications. It was felt in the past that her encephalopathy may have been partly attributed to not taking her Parkinson's medicines as well as he thyroid replacement. However, the patient has never been unresponsive. There is been no history of recent fevers, chills, complaints of chest pain, shortness breath, vomiting, diarrhea. At baseline, the patient has some pleasant confusion but is able to converse. She normally gets around in a wheelchair, but occasionally uses a walker. In the emergency department, the patient had temperature 100.4 F, but blood pressure as high as 208/73. EKG showed sinus tachycardia without any ST-T wave changes. CPK was 1065. UA showed 21-50 WBC. Otherwise BMP and CBC were essentially unremarkable. CT of the brain was negative for any acute findings. Chest x-ray was negative for any infiltrates. Neurology and pulmonarywereconsulted to assist with management.  Assessment/Plan: Acute encephalopathy, type unspecified -Etiology unclear although likely multifactorial  including UTI, B12 deficiency, possible seizure, levodopa withdraw -Appreciate neurology consult -07/12/2018 CT brain negative for acute findings -MRI of brain--cannot obtain while on ventilator -06/20/18 CT brain--neg for acute findings -Folic GYBW--3.8 -LHTDSKA--76 -TSH 0.462 -Urine drug screen negative -Personally reviewed EKG--sinus rhythm, no ST-T wave changes -06/23/2018--patient remainsminimally responsivetoprotopathic stimuli despite being off sedation -Obtained EEG--global slowing and triphasics consistent with metabolic encephalopathy -continue empiric keppra -Continue carbidopa/levodopa through her OG-tube as recommended by neurology service.  Acute respiratory failure with hypoxia -Secondary to hypoventilation from encephalopathy; now due to HCAP and pulmonary edema -Currently on mechanical ventilation (intubated 5/7) -5/12 personally reviewed CXR--increase interstitial markings increase RLL opacity; suggesting pneumonia development. -increasing FiO2 in past 24 hours -lasix 40 mg IV x 1 on 5/12 -06/23/18--started enteral feeding  HCAP/Aspiration pneumonitis -CXR as discussed above, just an aspiration pneumonitis/new infiltrates. -Continue cefepime/metronidazole -tracheal aspirate for culture -blood cultures x 2 -check PCT -Patient continues spiking fever.  UTI--EColi -appropriately treated with ceftriaxone for 5 days.  B12 deficiency -Repleting IM daily x 7 days, then once per week x 4 weeks  Parkinson's Disease -appreciate GI assistance-->OG placed to mid-esophagus-->trial of meds only with patient sitting at 45 degrees -continue carbidopa/levidopa  -appreciate neurology assistance.  Nontraumatic rhabdomyolysis -Initial CPK 1065>>>575>>>414 -continue Judicious IV fluids  Elevated troponin -Secondary to demand ischemia -Trend is flat -Echocardiogram--60-65%, no PFO, grade 1 DD  FEN -status post OG tube placement; continue tube feeding.  -continue  D51/2NS  Diabetes mellitus type 2 -Holding glipizide and Tradjenta currently -Continue NovoLog sliding scale and tube feedings coverage with NovoLog 2 units every 4 hours. -Hemoglobin A1c--6.2  Hypothyroidism -ContinueIV Synthroid  Hyperlipidemia -Continue statins through OG tube  This patient is critically ill with multiple organ system failure and requiring high complexity decision  making for assessment and support, frequent evaluation and titration of therapies, application of advance monitoring technologies and extensive interpretation of multiple databases.  More than 50% of the time was dedicated to face-to-face examination, discussion with other specialist involved in her care (pulmonologist, palliative care and neurology); and discussing about plan of care with nursing staff.  Time > 64mnutes    Disposition Plan:Remain in ICU  Family Communication:Daughter updated on phone 5/11  Consultants:pulmonary, palliative  Code Status: FULL  DVT Prophylaxis: Dendron Lovenox   Procedures: As Listed in Progress Note Above  Antibiotics: Ceftriaxone 5/7>>5/11    Subjective: Patient has remained intubated and mechanically ventilated; continues spiking fevers.  Unable to follow commands.  No requiring sedation.  Objective: Vitals:   06/25/18 1400 06/25/18 1500 06/25/18 1600 06/25/18 1612  BP: (!) 151/55 (!) 148/53 (!) 159/51   Pulse:      Resp: 15 (!) 22 18 (!) 24  Temp: 99.9 F (37.7 C) 99.9 F (37.7 C) 99.7 F (37.6 C) 99.7 F (37.6 C)  TempSrc:    Other (Comment)  SpO2:      Weight:      Height:        Intake/Output Summary (Last 24 hours) at 06/25/2018 1804 Last data filed at 06/25/2018 1757 Gross per 24 hour  Intake 1706.98 ml  Output 1300 ml  Net 406.98 ml   Weight change: 0.1 kg  Exam: General exam: Still spiking fever; able to move extremities to touch or painful stimuli; unable to follow commands; intubated and mechanically  ventilated Respiratory system: Positive rhonchi bilaterally; no crackles. Cardiovascular system:RRR. No murmurs, rubs, gallops. Gastrointestinal system: Abdomen is nondistended, soft and nontender. No organomegaly or masses felt. Normal bowel sounds heard. Central nervous system: Unable to properly assess in the setting of poor neurological response to commands. Extremities: No C/C/E, +pedal pulses Skin: No rashes, lesions or ulcers   Data Reviewed: I have personally reviewed following labs and imaging studies  Basic Metabolic Panel: Recent Labs  Lab 07/04/2018 1719  06/21/18 0451 06/22/18 0446 06/23/18 0506 06/24/18 0409 06/25/18 0519  NA  --    < > 147* 145 139 135 135  K  --    < > 3.8 3.4* 4.1 4.2 4.8  CL  --    < > 116* 116* 112* 109 107  CO2  --    < > 15* 21* 20* 19* 20*  GLUCOSE  --    < > 135* 170* 187* 261* 291*  BUN  --    < > 26* '18 15 15 20  '$ CREATININE  --    < > 1.00 0.65 0.58 0.71 0.82  CALCIUM  --    < > 8.8* 9.1 8.7* 8.2* 8.7*  MG 2.3  --   --  1.8 1.5*  --   --   PHOS  --   --   --  2.0* 2.4*  --   --    < > = values in this interval not displayed.   Liver Function Tests: Recent Labs  Lab 06/27/2018 1306 06/22/18 0446 06/24/18 0409  AST 43* 25 25  ALT '22 5 9  '$ ALKPHOS 83 57 59  BILITOT 0.9 0.7 0.4  PROT 7.1 5.4* 4.9*  ALBUMIN 3.8 2.5* 2.1*   No results for input(s): LIPASE, AMYLASE in the last 168 hours.   Recent Labs  Lab 06/20/18 0855  AMMONIA 12   Coagulation Profile: Recent Labs  Lab 06/29/2018 1306  INR 1.0   CBC:  Recent Labs  Lab 07/07/2018 1306  06/21/18 0451 06/22/18 0446 06/23/18 0506 06/24/18 0409 06/25/18 0519  WBC 7.6   < > 8.5 6.6 7.4 6.9 13.5*  NEUTROABS 6.3  --   --  5.3 6.0  --   --   HGB 14.3   < > 10.1* 9.7* 9.7* 9.0* 10.4*  HCT 45.7   < > 33.4* 30.3* 31.7* 28.5* 31.3*  MCV 93.8   < > 96.3 92.9 93.5 93.4 91.0  PLT 195   < > 159 139* 140* 145* 162   < > = values in this interval not displayed.   Cardiac  Enzymes: Recent Labs  Lab 07/08/2018 1306 06/27/2018 1551 06/18/2018 2104 06/20/18 0344 06/21/18 0451 06/22/18 0446 06/24/18 0409  CKTOTAL 1,065*  --   --  721* 575* 433* 414*  TROPONINI  --  0.05* 0.06* 0.06*  --   --   --    BNP: Invalid input(s): POCBNP   CBG: Recent Labs  Lab 06/24/18 2344 06/25/18 0433 06/25/18 0751 06/25/18 1124 06/25/18 1615  GLUCAP 258* 206* 237* 229* 241*   HbA1C: No results for input(s): HGBA1C in the last 72 hours.   Urine analysis:    Component Value Date/Time   COLORURINE YELLOW 06/24/2018 0546   APPEARANCEUR CLOUDY (A) 06/24/2018 0546   LABSPEC 1.012 06/24/2018 0546   PHURINE 5.0 06/24/2018 0546   GLUCOSEU 150 (A) 06/24/2018 0546   HGBUR SMALL (A) 06/24/2018 0546   BILIRUBINUR NEGATIVE 06/24/2018 0546   KETONESUR NEGATIVE 06/24/2018 0546   PROTEINUR NEGATIVE 06/24/2018 0546   UROBILINOGEN 0.2 11/20/2012 1254   NITRITE NEGATIVE 06/24/2018 0546   LEUKOCYTESUR LARGE (A) 06/24/2018 0546    Recent Results (from the past 240 hour(s))  SARS Coronavirus 2 (CEPHEID - Performed in [facility] hospital lab), Hosp Order     Status: None   Collection Time: 07/05/2018 12:15 PM  Result Value Ref Range Status   SARS Coronavirus 2 NEGATIVE NEGATIVE Final    Comment: (NOTE) If result is NEGATIVE SARS-CoV-2 target nucleic acids are NOT DETECTED. The SARS-CoV-2 RNA is generally detectable in upper and lower  respiratory specimens during the acute phase of infection. The lowest  concentration of SARS-CoV-2 viral copies this assay can detect is 250  copies / mL. A negative result does not preclude SARS-CoV-2 infection  and should not be used as the sole basis for treatment or other  patient management decisions.  A negative result may occur with  improper specimen collection / handling, submission of specimen other  than nasopharyngeal swab, presence of viral mutation(s) within the  areas targeted by this assay, and inadequate number of viral copies   (<250 copies / mL). A negative result must be combined with clinical  observations, patient history, and epidemiological information. If result is POSITIVE SARS-CoV-2 target nucleic acids are DETECTED. The SARS-CoV-2 RNA is generally detectable in upper and lower  respiratory specimens dur ing the acute phase of infection.  Positive  results are indicative of active infection with SARS-CoV-2.  Clinical  correlation with patient history and other diagnostic information is  necessary to determine patient infection status.  Positive results do  not rule out bacterial infection or co-infection with other viruses. If result is PRESUMPTIVE POSTIVE SARS-CoV-2 nucleic acids MAY BE PRESENT.   A presumptive positive result was obtained on the submitted specimen  and confirmed on repeat testing.  While 2019 novel coronavirus  (SARS-CoV-2) nucleic acids may be present in the submitted sample  additional confirmatory  testing may be necessary for epidemiological  and / or clinical management purposes  to differentiate between  SARS-CoV-2 and other Sarbecovirus currently known to infect humans.  If clinically indicated additional testing with an alternate test  methodology [phone]) is advised. The SARS-CoV-2 RNA is generally  detectable in upper and lower respiratory sp ecimens during the acute  phase of infection. The expected result is Negative. Fact Sheet for Patients:  StrictlyIdeas.no Fact Sheet for Healthcare Providers: BankingDealers.co.za This test is not yet approved or cleared by the [place] FDA and has been authorized for detection and/or diagnosis of SARS-CoV-2 by FDA under an Emergency Use Authorization (EUA).  This EUA will remain in effect (meaning this test can be used) for the duration of the COVID-19 declaration under Section 564(b)(1) of the Act, 21 U.S.C. section 360bbb-3(b)(1), unless the authorization is terminated  or revoked sooner. Performed at [facility], [address], [place], [place] [address]   Urine culture     Status: Abnormal   Collection Time: 07/11/2018  2:38 PM  Result Value Ref Range Status   Specimen Description   Final    URINE, CLEAN CATCH Performed at [facility], [address], [place], [place] [address]    Special Requests   Final    NONE Performed at [facility], [address], [place], [place] [address]    Culture 90,000 COLONIES/mL ESCHERICHIA COLI (A)  Final   Report Status 06/21/2018 FINAL  Final   Organism ID, Bacteria ESCHERICHIA COLI (A)  Final      Susceptibility   Escherichia coli - MIC*    AMPICILLIN 4 SENSITIVE Sensitive     CEFAZOLIN <=4 SENSITIVE Sensitive     CEFTRIAXONE <=1 SENSITIVE Sensitive     CIPROFLOXACIN >=4 RESISTANT Resistant     GENTAMICIN <=1 SENSITIVE Sensitive     IMIPENEM <=0.25 SENSITIVE Sensitive     NITROFURANTOIN <=16 SENSITIVE Sensitive     TRIMETH/SULFA <=20 SENSITIVE Sensitive     AMPICILLIN/SULBACTAM <=2 SENSITIVE Sensitive     PIP/TAZO <=4 SENSITIVE Sensitive     Extended ESBL NEGATIVE Sensitive     * 90,000 COLONIES/mL ESCHERICHIA COLI  MRSA PCR Screening     Status: None   Collection Time: 06/28/2018  7:13 PM  Result Value Ref Range Status   MRSA by PCR NEGATIVE NEGATIVE Final    Comment:        The GeneXpert MRSA Assay (FDA approved for NASAL specimens only), is one component of a comprehensive MRSA colonization surveillance program. It is not intended to diagnose MRSA infection nor to guide or monitor treatment for MRSA infections. Performed at [facility], [address], [place], [place] [address]   Culture, blood (routine x 2)     Status: None   Collection Time: 06/20/2018  9:04 PM  Result Value Ref Range Status   Specimen Description BLOOD LEFT HAND  Final   Special Requests   Final    BOTTLES DRAWN AEROBIC AND ANAEROBIC Blood Culture adequate volume   Culture   Final    NO GROWTH 5 DAYS Performed at [facility], [address] [place], [place] [address]    Report Status 06/24/2018 FINAL  Final  Culture, blood (routine x 2)     Status: None   Collection Time: 06/27/2018  9:05 PM  Result Value Ref Range Status   Specimen Description BLOOD RIGHT HAND  Final   Special Requests   Final    BOTTLES DRAWN AEROBIC AND ANAEROBIC Blood  Culture adequate volume   Culture   Final    NO GROWTH 5 DAYS Performed at [facility], [address] [place], [place] [address]    Report Status 06/24/2018 FINAL  Final     Scheduled Meds:  aspirin  300 mg Rectal Daily   carbidopa-levodopa  2 tablet Oral TID   chlorhexidine gluconate (MEDLINE KIT)  15 mL Mouth Rinse BID   Chlorhexidine Gluconate Cloth  6 each Topical Q0600   feeding supplement (OSMOLITE 1.5 CAL)  1,000 mL Per Tube Q24H   insulin aspart  0-9 Units Subcutaneous Q4H   insulin aspart  2 Units Subcutaneous Q4H   levothyroxine  50 mcg Intravenous Daily   mouth rinse  15 mL Mouth Rinse 10 times per day   metoCLOPramide (REGLAN) injection  5 mg Intravenous Q8H   rotigotine  1 patch Transdermal Daily   Continuous Infusions:  ceFEPime (MAXIPIME) IV 2 g (06/25/18 1004)   dextrose 5 % and 0.45 % NaCl with KCl 20 mEq/L Stopped (06/24/18 1643)   famotidine (PEPCID) IV 20 mg (06/25/18 1014)   levETIRAcetam 500 mg (06/25/18 1003)   metronidazole 500 mg (06/25/18 1700)    Procedures/Studies: Dg Abd 1 View  Result Date: 06/21/2018 CLINICAL DATA:  Check gastric catheter placement EXAM: ABDOMEN - 1 VIEW COMPARISON:  None. FINDINGS: Scattered large and small bowel gas is noted. Degenerative changes of lumbar spine are noted with scoliosis concave to the right. Check gastric catheter is noted in the mid esophagus at the superior aspect of the film. This could be advanced several cm to reach the stomach. IMPRESSION: Gastric catheter in the mid esophagus. Electronically Signed   By: Inez Catalina M.D.   On: 06/21/2018 15:47   Ct Head Wo  Contrast  Result Date: 06/20/2018 CLINICAL DATA:  85 year old with altered mental status. EXAM: CT HEAD WITHOUT CONTRAST TECHNIQUE: Contiguous axial images were obtained from the base of the skull through the vertex without intravenous contrast. COMPARISON:  07/05/2018 FINDINGS: Brain: Stable cerebral atrophy. Negative for acute hemorrhage, mass lesion, midline shift, hydrocephalus or large infarct. Again noted are hypodensities in the white matter suggesting chronic changes. Focal low-density in the right frontal-parietal white matter is compatible with old ischemic changes. Patient's head rotated on this examination. Vascular: No hyperdense vessel or unexpected calcification. Skull: Normal. Negative for fracture or focal lesion. Sinuses/Orbits: No acute finding. Other: None. IMPRESSION: 1. No acute intracranial abnormality. 2. Stable atrophy and chronic white matter changes. Findings are most compatible with chronic small vessel ischemic changes. Electronically Signed   By: Markus Daft M.D.   On: 06/20/2018 15:55   Ct Head Wo Contrast  Result Date: 07/10/2018 CLINICAL DATA:  85 year old female with a history of altered mental status EXAM: CT HEAD WITHOUT CONTRAST TECHNIQUE: Contiguous axial images were obtained from the base of the skull through the vertex without intravenous contrast. COMPARISON:  12/15/2012 FINDINGS: Brain: No acute intracranial hemorrhage. No midline shift or mass effect. Gray-white differentiation maintained. Progression of senescent volume loss. Patchy hypodensities in the periventricular white matter. Unremarkable appearance of the ventricular system. Vascular: Dense calcifications of anterior and posterior circulation. Skull: No acute fracture.  No aggressive bone lesion identified. Sinuses/Orbits: Unremarkable appearance of the orbits. Mastoid air cells clear. No middle ear effusion. No significant sinus disease. Other: Endotracheal tube in position.  Fluid in the nasopharynx  IMPRESSION: Negative for acute intracranial abnormality. Senescent volume loss and evidence of chronic microvascular ischemic disease. Electronically Signed   By: Corrie Mckusick  D.O.   On: 06/27/2018 14:11   Dg Chest Port 1 View  Result Date: 06/25/2018 CLINICAL DATA:  85 year old female with encephalopathy, intubated. Negative for COVID-19 recently. EXAM: PORTABLE CHEST 1 VIEW COMPARISON:  06/24/2018 and earlier. FINDINGS: Portable AP upright views at 0554 hours. Stable endotracheal tube tip at the level the clavicles. Enteric tube courses to the abdomen, tip not included. Stable right PICC line. Mildly improved lung volumes. Stable cardiac size and mediastinal contours. No pneumothorax. Stable pulmonary vascularity without overt edema. No pleural effusion. Mild patchy lung base opacity most resembles atelectasis. Paucity of bowel gas in the upper abdomen. No acute osseous abnormality identified. IMPRESSION: 1. Stable lines and tubes. 2. Improved lung volumes with mild lung base atelectasis. Electronically Signed   By: Genevie Ann M.D.   On: 06/25/2018 08:02   Dg Chest Port 1 View  Result Date: 06/24/2018 CLINICAL DATA:  Followup ventilator support EXAM: PORTABLE CHEST 1 VIEW COMPARISON:  06/22/2018 FINDINGS: Endotracheal tube tip is 3 cm above the carina. Orogastric or nasogastric tube extends at least to the distal esophagus. Right-sided central line tip at the SVC RA junction. The patient is rotated towards the right. The left lung appears clear. There appears to be volume loss and infiltrate in the right lung. IMPRESSION: Patient rotated towards the right. Possible worsening of infiltrate in volume loss in the right lung. Orogastric or nasogastric tube tip not clearly seen due to underpenetration. Electronically Signed   By: Nelson Chimes M.D.   On: 06/24/2018 10:40   Dg Chest Port 1 View  Result Date: 06/22/2018 CLINICAL DATA:  Respiratory failure. EXAM: PORTABLE CHEST 1 VIEW COMPARISON:  06/21/2018  FINDINGS: The patient is rotated to the left. A right PICC has been placed and terminates over the lower SVC. An endotracheal tube terminates just below the clavicles, unchanged. An enteric tube is suboptimally visualized though now appears to extend into the upper abdomen. The cardiomediastinal silhouette is unchanged. Lung volumes remain low without evidence of airspace consolidation, edema, pleural effusion, pneumothorax. IMPRESSION: 1. Support devices including new right PICC as above. 2. Low lung volumes without evidence of acute airspace disease. Electronically Signed   By: Logan Bores M.D.   On: 06/22/2018 08:16   Dg Chest Port 1 View  Result Date: 06/21/2018 CLINICAL DATA:  Respiratory failure. EXAM: PORTABLE CHEST 1 VIEW COMPARISON:  Radiograph of Jun 20, 2018. FINDINGS: The heart size and mediastinal contours are within normal limits. Endotracheal tube is unchanged in position. Nasogastric tube appears to be looped within the distal esophagus with tip directed back up into the more proximal esophagus. No acute pulmonary disease is noted. No pneumothorax or pleural effusion is noted. Atherosclerosis of thoracic aorta is noted. The visualized skeletal structures are unremarkable. IMPRESSION: Endotracheal tube is unchanged in position. Nasogastric tube is looped within distal esophagus, with distal tip directed into the more proximal esophagus. No acute cardiopulmonary abnormality seen. Aortic Atherosclerosis (ICD10-I70.0). Electronically Signed   By: Marijo Conception M.D.   On: 06/21/2018 08:48   Dg Chest Port 1 View  Result Date: 06/20/2018 CLINICAL DATA:  85 year old female with repositioning of the endotracheal tube EXAM: PORTABLE CHEST 1 VIEW COMPARISON:  06/20/2018 FINDINGS: Cardiomediastinal silhouette unchanged in size and contour. No evidence of central vascular congestion. The endotracheal tube has been slightly withdrawn, now terminating 2.7 cm above the carina. There is a linear contour of  the right lung of uncertain significance. There are overlying surgical lines tubes in EKG leads of the  right chest somewhat obscuring evaluation. Low lung volumes. Pleuroparenchymal opacity of the right lung, may represent chronic changes and some pleural fluid. No new airspace disease. IMPRESSION: Endotracheal tube has been slightly withdrawn now terminating 2.7 cm above the carina. There is linear contour of the right lung extending in a convex configuration towards the costophrenic angle. While this is favored to represent artifact, a right pneumothorax cannot be excluded. Correlation with physical exam may be useful, as well as a repeat chest x-ray with adequate positioning and removal of the obscuring surgical line/hardware and EKG leads. These results were discussed by telephone at the time of interpretation on 06/20/2018 at 9:21 am with the nurse caring for the patient, Ms Sherry Ruffing. Electronically Signed   By: Corrie Mckusick D.O.   On: 06/20/2018 09:21   Dg Chest Port 1 View  Result Date: 06/20/2018 CLINICAL DATA:  Unresponsive. EXAM: PORTABLE CHEST 1 VIEW COMPARISON:  Radiograph Jun 19, 2018. FINDINGS: The heart size and mediastinal contours are within normal limits. Atherosclerosis of thoracic aorta is noted. Endotracheal tube is directed into right mainstem bronchus; withdrawal by 3-4 cm is recommended. Both lungs are clear. The visualized skeletal structures are unremarkable. IMPRESSION: Endotracheal tube directed into right mainstem bronchus; withdrawal by 3-4 cm is recommended. No acute cardiopulmonary abnormality seen. These results will be called to the ordering clinician or representative by the Radiologist Assistant, and communication documented in the PACS or zVision Dashboard. Electronically Signed   By: Marijo Conception M.D.   On: 06/20/2018 08:16   Dg Chest Portable 1 View  Result Date: 07/04/2018 CLINICAL DATA:  Unresponsive. EXAM: PORTABLE CHEST 1 VIEW COMPARISON:  06/27/2010 FINDINGS:  1301 hours. Endotracheal tube tip is approximately 1.4 cm above the base of the carina. The lungs are clear without focal pneumonia, edema, pneumothorax or pleural effusion. The cardiopericardial silhouette is within normal limits for size. The visualized bony structures of the thorax are intact. Telemetry leads overlie the chest. IMPRESSION: No active disease. Electronically Signed   By: Misty Stanley M.D.   On: 06/23/2018 13:23   Dg Chest Port 1v Same Day  Result Date: 06/20/2018 CLINICAL DATA:  Intubated patient. Possible pneumothorax on plain film of the chest earlier today. EXAM: PORTABLE CHEST 1 VIEW COMPARISON:  Single-view of the chest earlier today. FINDINGS: Endotracheal tube is in place with the tip well above the carina. There is no pneumothorax. Finding described on the prior report was likely a skin fold. Lungs clear. Heart size normal. IMPRESSION: Negative for pneumothorax.  No acute disease. ETT in good position. Electronically Signed   By: Inge Rise M.D.   On: 06/20/2018 10:16   Dg Addison Bailey G Tube Plc W/fl W/rad  Result Date: 06/23/2018 CLINICAL DATA:  Difficulty advancing OG tube into the stomach EXAM: NASO G TUBE PLACEMENT WITH FL AND WITH RAD CONTRAST:  20 mL Omnipaque 300 FLUOROSCOPY TIME:  Fluoroscopy Time:  5 seconds Radiation Exposure Index (if provided by the fluoroscopic device): Number of Acquired Spot Images: 0 COMPARISON:  Abdomen film Jun 21, 2018 FINDINGS: Fluoroscopy over the lower chest and upper abdomen demonstrates the tube to have advanced since prior KUB with the tip in the proximal stomach. This was advanced slightly further with the tip now in the mid to distal stomach. Contrast was injected to confirm placement. IMPRESSION: OG tube advanced into the mid to distal stomach. Electronically Signed   By: Rolm Baptise M.D.   On: 06/23/2018 15:49    Barton Dubois, MD  [facility] Pager [phone]  06/25/2018, 6:04 PM   LOS: 6 days

## 2018-06-26 ENCOUNTER — Inpatient Hospital Stay (HOSPITAL_COMMUNITY): Payer: Medicare Other

## 2018-06-26 DIAGNOSIS — Z7189 Other specified counseling: Secondary | ICD-10-CM

## 2018-06-26 DIAGNOSIS — N179 Acute kidney failure, unspecified: Secondary | ICD-10-CM

## 2018-06-26 LAB — BLOOD GAS, ARTERIAL
Acid-base deficit: 0.1 mmol/L (ref 0.0–2.0)
Bicarbonate: 24.6 mmol/L (ref 20.0–28.0)
Drawn by: 41977
FIO2: 40
MECHVT: 410 mL
O2 Saturation: 98.1 %
PEEP: 5 cmH2O
Patient temperature: 37.5
RATE: 16 resp/min
pCO2 arterial: 34 mmHg (ref 32.0–48.0)
pH, Arterial: 7.452 — ABNORMAL HIGH (ref 7.350–7.450)
pO2, Arterial: 113 mmHg — ABNORMAL HIGH (ref 83.0–108.0)

## 2018-06-26 LAB — URINE CULTURE: Culture: >=100000 — AB

## 2018-06-26 LAB — GLUCOSE, CAPILLARY
Glucose-Capillary: 125 mg/dL — ABNORMAL HIGH (ref 70–99)
Glucose-Capillary: 149 mg/dL — ABNORMAL HIGH (ref 70–99)
Glucose-Capillary: 157 mg/dL — ABNORMAL HIGH (ref 70–99)
Glucose-Capillary: 165 mg/dL — ABNORMAL HIGH (ref 70–99)
Glucose-Capillary: 172 mg/dL — ABNORMAL HIGH (ref 70–99)
Glucose-Capillary: 240 mg/dL — ABNORMAL HIGH (ref 70–99)

## 2018-06-26 LAB — PROCALCITONIN: Procalcitonin: 0.83 ng/mL

## 2018-06-26 MED ORDER — GLYCOPYRROLATE 0.2 MG/ML IJ SOLN
0.2000 mg | Freq: Four times a day (QID) | INTRAMUSCULAR | Status: DC
  Administered 2018-06-26 – 2018-06-30 (×19): 0.2 mg via INTRAVENOUS
  Filled 2018-06-26 (×18): qty 1

## 2018-06-26 NOTE — Progress Notes (Signed)
PROGRESS NOTE  Jennifer [name] HUD:[id number] DOB: February 08, 1931 DOA: 07/06/2018 PCP: [name], MD  Brief History: 85 year old female with a history of TIA, Parkinson's disease, hypothyroidism, hypertension, diabetes mellitus, hyperlipidemia, PSVT status post RFA 2006, and tobacco abuse in remission presented with unresponsiveness. The patient lives by herself, but her granddaughter intermittently checks up on her. On 06/18/2018, the patient granddaughter noted the patient to be lethargic, but felt that the patient was just tired and sleepy. She went back to check up on the patient on the morning of 06/15/2018 and noted the patient to be essentially unresponsive. EMS was activated. In the emergency department, the patient was intubated to protect her airway secondary to her encephalopathy. According to the patient's daughter, the patient has had a long history of poor compliance with her medications. It was felt in the past that her encephalopathy may have been partly attributed to not taking her Parkinson's medicines as well as he thyroid replacement. However, the patient has never been unresponsive. There is been no history of recent fevers, chills, complaints of chest pain, shortness breath, vomiting, diarrhea. At baseline, the patient has some pleasant confusion but is able to converse. She normally gets around in a wheelchair, but occasionally uses a walker. In the emergency department, the patient had temperature 100.4 F, but blood pressure as high as 208/73. EKG showed sinus tachycardia without any ST-T wave changes. CPK was 1065. UA showed 21-50 WBC. Otherwise BMP and CBC were essentially unremarkable. CT of the brain was negative for any acute findings. Chest x-ray was negative for any infiltrates. Neurology and pulmonarywereconsulted to assist with management.  Assessment/Plan: Acute encephalopathy, type unspecified -Etiology unclear although likely multifactorial  including UTI, B12 deficiency, possible seizure, levodopa withdraw -Appreciate neurology consult -07/08/2018 CT brain negative for acute findings -MRI of brain--cannot obtain while on ventilator -06/20/18 CT brain--neg for acute findings -Folic CHYI--5.0 -YDXAJOI--78 -TSH 0.462 -Urine drug screen negative -Personally reviewed EKG--sinus rhythm, no ST-T wave changes -06/23/2018--patient remainsminimally responsivetoprotopathic stimuli despite being off sedation. -palliative care discussed with family and decision has been made for one way extubation and comfort care on 06/27/18. She is now DNR> -Obtained EEG--global slowing and triphasics consistent with metabolic encephalopathy -continue empiric keppra -Continue carbidopa/levodopa through her OG-tube as recommended by neurology service.  Acute respiratory failure with hypoxia -Secondary to hypoventilation from encephalopathy; now due to HCAP and pulmonary edema -Currently on mechanical ventilation (intubated since 5/7) -5/12 personally reviewed CXR--increase interstitial markings increase RLL opacity; suggesting pneumonia development. -06/23/18--started enteral feeding  HCAP/Aspiration pneumonitis -CXR as discussed above, just an aspiration pneumonitis/new infiltrates. -Continue cefepime/metronidazole -tracheal aspirate for culture -blood cultures x 2 without any growth. -check PCT -Patient currently afebrile.  UTI--EColi -appropriately treated with ceftriaxone for 5 days.  B12 deficiency -Repleting IM daily x 7 days, then once per week x 4 weeks  Parkinson's Disease -appreciate GI assistance-->OG placed to mid-esophagus-->trial of meds only with patient sitting at 45 degrees -continue carbidopa/levidopa  -appreciate neurology assistance.  Nontraumatic rhabdomyolysis -Initial CPK 1065>>>575>>>414 -continue Judicious IV fluids  Elevated troponin -Secondary to demand ischemia -Trend is flat -Echocardiogram--60-65%, no  PFO, grade 1 DD  FEN -status post OG tube placement; continue tube feeding.  -continue D51/2NS  Diabetes mellitus type 2 -Holding glipizide and Tradjenta currently -Continue NovoLog sliding scale and tube feedings coverage with NovoLog 2 units every 4 hours. -Hemoglobin A1c--6.2 -Continue to follow CBGs and adjust hypoglycemic regimen as needed.  Hypothyroidism -ContinueIV Synthroid  Hyperlipidemia -Continue statins through OG tube   This patient is critically ill with multiple organ system failure requiring high complexity decision making for assessment and support, frequent evaluation and titration of therapies, application of advance monitoring technologies and extensive interpretation of multiple databases.  More than 50% of the time was dedicated to face-to-face examination, discussion with other specialties involving her care and planning interventions with nursing staff.  Time: more than 35 minutes.    Disposition Plan:Remain in ICU  Family Communication:Daughter updated on phone 5/13  Consultants:pulmonary, palliative care; neurology  Code Status: DNR  DVT Prophylaxis: [place] Lovenox   Procedures: As Listed in Progress Note Above  Antibiotics: Ceftriaxone 5/7>>5/11    Subjective: Patient remains intubated and mechanically ventilated; afebrile today.  No significant improvement in her mentation.  Objective: Vitals:   06/26/18 1240 06/26/18 1622 06/26/18 1642 06/26/18 1700  BP:    (!) 159/52  Pulse:      Resp:   (!) 24 (!) 27  Temp:   98.2 F (36.8 C)   TempSrc:   Oral   SpO2: 98% 97%    Weight:      Height:        Intake/Output Summary (Last 24 hours) at 06/26/2018 1822 Last data filed at 06/26/2018 1726 Gross per 24 hour  Intake 876.12 ml  Output 450 ml  Net 426.12 ml   Weight change: 0 kg  Exam: General exam: Currently afebrile, unable to follow commands, no major distress reported overnight.  Patient remains intubated  and mechanically ventilated.  Given lack of proper improvement in mentation remains at high risk not to be able to protect airways. Respiratory system: No crackles, no wheezing, positive rhonchi. Cardiovascular system:RRR. No murmurs, rubs, gallops. Gastrointestinal system: Abdomen is nondistended, soft and nontender. No organomegaly or masses felt. Normal bowel sounds heard. Central nervous system: Unable to properly assess given current mentation. Extremities: No cyanosis or clubbing appreciated; positive upper extremity swelling bilaterally. Skin: No rashes, lesions or ulcers Psychiatry: Unable to properly assess given current mentation.  Data Reviewed: I have personally reviewed following labs and imaging studies  Basic Metabolic Panel: Recent Labs  Lab 06/21/18 0451 06/22/18 0446 06/23/18 0506 06/24/18 0409 06/25/18 0519  NA 147* 145 139 135 135  K 3.8 3.4* 4.1 4.2 4.8  CL 116* 116* 112* 109 107  CO2 15* 21* 20* 19* 20*  GLUCOSE 135* 170* 187* 261* 291*  BUN 26* '18 15 15 20  '$ CREATININE 1.00 0.65 0.58 0.71 0.82  CALCIUM 8.8* 9.1 8.7* 8.2* 8.7*  MG  --  1.8 1.5*  --   --   PHOS  --  2.0* 2.4*  --   --    Liver Function Tests: Recent Labs  Lab 06/22/18 0446 06/24/18 0409  AST 25 25  ALT 5 9  ALKPHOS 57 59  BILITOT 0.7 0.4  PROT 5.4* 4.9*  ALBUMIN 2.5* 2.1*    Recent Labs  Lab 06/20/18 0855  AMMONIA 12   Coagulation Profile: No results for input(s): INR, PROTIME in the last 168 hours. CBC: Recent Labs  Lab 06/21/18 0451 06/22/18 0446 06/23/18 0506 06/24/18 0409 06/25/18 0519  WBC 8.5 6.6 7.4 6.9 13.5*  NEUTROABS  --  5.3 6.0  --   --   HGB 10.1* 9.7* 9.7* 9.0* 10.4*  HCT 33.4* 30.3* 31.7* 28.5* 31.3*  MCV 96.3 92.9 93.5 93.4 91.0  PLT 159 139* 140* 145* 162   Cardiac Enzymes: Recent Labs  Lab 07/04/2018 2104 06/20/18 0344  06/21/18 0451 06/22/18 0446 06/24/18 0409  CKTOTAL  --  721* 575* 433* 414*  TROPONINI 0.06* 0.06*  --   --   --     BNP: Invalid input(s): POCBNP   CBG: Recent Labs  Lab 06/26/18 0039 06/26/18 0455 06/26/18 0738 06/26/18 1140 06/26/18 1647  GLUCAP 149* 240* 157* 125* 165*   HbA1C: No results for input(s): HGBA1C in the last 72 hours.   Urine analysis:    Component Value Date/Time   COLORURINE YELLOW 06/24/2018 0546   APPEARANCEUR CLOUDY (A) 06/24/2018 0546   LABSPEC 1.012 06/24/2018 0546   PHURINE 5.0 06/24/2018 0546   GLUCOSEU 150 (A) 06/24/2018 0546   HGBUR SMALL (A) 06/24/2018 0546   BILIRUBINUR NEGATIVE 06/24/2018 0546   KETONESUR NEGATIVE 06/24/2018 0546   PROTEINUR NEGATIVE 06/24/2018 0546   UROBILINOGEN 0.2 11/20/2012 1254   NITRITE NEGATIVE 06/24/2018 0546   LEUKOCYTESUR LARGE (A) 06/24/2018 0546    Recent Results (from the past 240 hour(s))  SARS Coronavirus 2 (CEPHEID - Performed in [place] hospital lab), Hosp Order     Status: None   Collection Time: 07/03/2018 12:15 PM  Result Value Ref Range Status   SARS Coronavirus 2 NEGATIVE NEGATIVE Final    Comment: (NOTE) If result is NEGATIVE SARS-CoV-2 target nucleic acids are NOT DETECTED. The SARS-CoV-2 RNA is generally detectable in upper and lower  respiratory specimens during the acute phase of infection. The lowest  concentration of SARS-CoV-2 viral copies this assay can detect is 250  copies / mL. A negative result does not preclude SARS-CoV-2 infection  and should not be used as the sole basis for treatment or other  patient management decisions.  A negative result may occur with  improper specimen collection / handling, submission of specimen other  than nasopharyngeal swab, presence of viral mutation(s) within the  areas targeted by this assay, and inadequate number of viral copies  (<250 copies / mL). A negative result must be combined with clinical  observations, patient history, and epidemiological information. If result is POSITIVE SARS-CoV-2 target nucleic acids are DETECTED. The SARS-CoV-2 RNA is  generally detectable in upper and lower  respiratory specimens dur ing the acute phase of infection.  Positive  results are indicative of active infection with SARS-CoV-2.  Clinical  correlation with patient history and other diagnostic information is  necessary to determine patient infection status.  Positive results do  not rule out bacterial infection or co-infection with other viruses. If result is PRESUMPTIVE POSTIVE SARS-CoV-2 nucleic acids MAY BE PRESENT.   A presumptive positive result was obtained on the submitted specimen  and confirmed on repeat testing.  While 2019 novel coronavirus  (SARS-CoV-2) nucleic acids may be present in the submitted sample  additional confirmatory testing may be necessary for epidemiological  and / or clinical management purposes  to differentiate between  SARS-CoV-2 and other Sarbecovirus currently known to infect humans.  If clinically indicated additional testing with an alternate test  methodology [id number]) is advised. The SARS-CoV-2 RNA is generally  detectable in upper and lower respiratory sp ecimens during the acute  phase of infection. The expected result is Negative. Fact Sheet for Patients:  StrictlyIdeas.no Fact Sheet for Healthcare Providers: BankingDealers.co.za This test is not yet approved or cleared by the [place] FDA and has been authorized for detection and/or diagnosis of SARS-CoV-2 by FDA under an Emergency Use Authorization (EUA).  This EUA will remain in effect (meaning this test can be used) for the duration of the  COVID-19 declaration under Section 564(b)(1) of the Act, 21 U.S.C. section 360bbb-3(b)(1), unless the authorization is terminated or revoked sooner. Performed at [facility], [address], [place], [place] [address]   Urine culture     Status: Abnormal   Collection Time: 06/24/2018  2:38 PM  Result Value Ref Range Status   Specimen Description   Final     URINE, CLEAN CATCH Performed at [facility], [address] [place], [place] [address]    Special Requests   Final    NONE Performed at [facility], [address], [place], [place] [address]    Culture 90,000 COLONIES/mL ESCHERICHIA COLI (A)  Final   Report Status 06/21/2018 FINAL  Final   Organism ID, Bacteria ESCHERICHIA COLI (A)  Final      Susceptibility   Escherichia coli - MIC*    AMPICILLIN 4 SENSITIVE Sensitive     CEFAZOLIN <=4 SENSITIVE Sensitive     CEFTRIAXONE <=1 SENSITIVE Sensitive     CIPROFLOXACIN >=4 RESISTANT Resistant     GENTAMICIN <=1 SENSITIVE Sensitive     IMIPENEM <=0.25 SENSITIVE Sensitive     NITROFURANTOIN <=16 SENSITIVE Sensitive     TRIMETH/SULFA <=20 SENSITIVE Sensitive     AMPICILLIN/SULBACTAM <=2 SENSITIVE Sensitive     PIP/TAZO <=4 SENSITIVE Sensitive     Extended ESBL NEGATIVE Sensitive     * 90,000 COLONIES/mL ESCHERICHIA COLI  MRSA PCR Screening     Status: None   Collection Time: 07/08/2018  7:13 PM  Result Value Ref Range Status   MRSA by PCR NEGATIVE NEGATIVE Final    Comment:        The GeneXpert MRSA Assay (FDA approved for NASAL specimens only), is one component of a comprehensive MRSA colonization surveillance program. It is not intended to diagnose MRSA infection nor to guide or monitor treatment for MRSA infections. Performed at [facility], [address], [place], [place] [address]   Culture, blood (routine x 2)     Status: None   Collection Time: 06/23/2018  9:04 PM  Result Value Ref Range Status   Specimen Description BLOOD LEFT HAND  Final   Special Requests   Final    BOTTLES DRAWN AEROBIC AND ANAEROBIC Blood Culture adequate volume   Culture   Final    NO GROWTH 5 DAYS Performed at [facility], [address], [place], [place] [address]    Report Status 06/24/2018 FINAL  Final  Culture, blood (routine x 2)     Status: None   Collection Time: 06/27/2018  9:05 PM  Result Value Ref Range Status   Specimen Description  BLOOD RIGHT HAND  Final   Special Requests   Final    BOTTLES DRAWN AEROBIC AND ANAEROBIC Blood Culture adequate volume   Culture   Final    NO GROWTH 5 DAYS Performed at [facility], [address], [place], [place] [address]    Report Status 06/24/2018 FINAL  Final  Culture, Urine     Status: Abnormal   Collection Time: 06/24/18  5:46 AM  Result Value Ref Range Status   Specimen Description   Final    URINE, CATHETERIZED Performed at [facility], [address] [place], [place] [address]    Special Requests   Final    NONE Performed at [facility], [address], [place], [place] [address]    Culture >=100,000 COLONIES/mL YEAST (A)  Final   Report Status 06/26/2018 FINAL  Final     Scheduled Meds:  aspirin  300 mg Rectal Daily   carbidopa-levodopa  2 tablet Oral TID   chlorhexidine gluconate (MEDLINE KIT)  15 mL Mouth Rinse BID   Chlorhexidine Gluconate Cloth  6 each Topical Q0600   feeding supplement (OSMOLITE 1.5 CAL)  1,000 mL Per Tube Q24H   glycopyrrolate  0.2 mg Intravenous QID   insulin aspart  0-9 Units Subcutaneous Q4H   insulin aspart  2 Units Subcutaneous Q4H   levothyroxine  50 mcg Intravenous Daily   mouth rinse  15 mL Mouth Rinse 10 times per day   rotigotine  1 patch Transdermal Daily   Continuous Infusions:  ceFEPime (MAXIPIME) IV Stopped (06/26/18 1003)   dextrose 5 % and 0.45 % NaCl with KCl 20 mEq/L Stopped (06/24/18 1643)   famotidine (PEPCID) IV Stopped (06/26/18 1004)   levETIRAcetam Stopped (06/26/18 0829)   metronidazole 100 mL/hr at 06/26/18 1726    Procedures/Studies: Dg Abd 1 View  Result Date: 06/21/2018 CLINICAL DATA:  Check gastric catheter placement EXAM: ABDOMEN - 1 VIEW COMPARISON:  None. FINDINGS: Scattered large and small bowel gas is noted. Degenerative changes of lumbar spine are noted with scoliosis concave to the right. Check gastric catheter is noted in the mid esophagus at the superior aspect of the film. This  could be advanced several cm to reach the stomach. IMPRESSION: Gastric catheter in the mid esophagus. Electronically Signed   By: Inez Catalina M.D.   On: 06/21/2018 15:47   Ct Head Wo Contrast  Result Date: 06/20/2018 CLINICAL DATA:  85 year old with altered mental status. EXAM: CT HEAD WITHOUT CONTRAST TECHNIQUE: Contiguous axial images were obtained from the base of the skull through the vertex without intravenous contrast. COMPARISON:  06/20/2018 FINDINGS: Brain: Stable cerebral atrophy. Negative for acute hemorrhage, mass lesion, midline shift, hydrocephalus or large infarct. Again noted are hypodensities in the white matter suggesting chronic changes. Focal low-density in the right frontal-parietal white matter is compatible with old ischemic changes. Patient's head rotated on this examination. Vascular: No hyperdense vessel or unexpected calcification. Skull: Normal. Negative for fracture or focal lesion. Sinuses/Orbits: No acute finding. Other: None. IMPRESSION: 1. No acute intracranial abnormality. 2. Stable atrophy and chronic white matter changes. Findings are most compatible with chronic small vessel ischemic changes. Electronically Signed   By: Markus Daft M.D.   On: 06/20/2018 15:55   Ct Head Wo Contrast  Result Date: 07/09/2018 CLINICAL DATA:  85 year old female with a history of altered mental status EXAM: CT HEAD WITHOUT CONTRAST TECHNIQUE: Contiguous axial images were obtained from the base of the skull through the vertex without intravenous contrast. COMPARISON:  12/15/2012 FINDINGS: Brain: No acute intracranial hemorrhage. No midline shift or mass effect. Gray-white differentiation maintained. Progression of senescent volume loss. Patchy hypodensities in the periventricular white matter. Unremarkable appearance of the ventricular system. Vascular: Dense calcifications of anterior and posterior circulation. Skull: No acute fracture.  No aggressive bone lesion identified. Sinuses/Orbits:  Unremarkable appearance of the orbits. Mastoid air cells clear. No middle ear effusion. No significant sinus disease. Other: Endotracheal tube in position.  Fluid in the nasopharynx IMPRESSION: Negative for acute intracranial abnormality. Senescent volume loss and evidence of chronic microvascular ischemic disease. Electronically Signed   By: Corrie Mckusick D.O.   On: 07/06/2018 14:11   Dg Chest Port 1 View  Result Date: 06/26/2018 CLINICAL DATA:  85 year old female with respiratory failure in cephalopathy, recently negative for COVID-19. EXAM: PORTABLE CHEST 1 VIEW COMPARISON:  06/25/2018 and earlier. FINDINGS: Portable AP upright view at  0540 hours. Stable lines and tubes. Stable lung volumes. The patient remains mildly rotated to the right. No pneumothorax, pulmonary edema, pleural effusion or consolidation. Mild infrahilar atelectasis suspected. Paucity bowel gas in the upper abdomen. IMPRESSION: 1. Stable lines and tubes. 2. Continued mild atelectasis. Electronically Signed   By: Genevie Ann M.D.   On: 06/26/2018 08:32   Dg Chest Port 1 View  Result Date: 06/25/2018 CLINICAL DATA:  85 year old female with encephalopathy, intubated. Negative for COVID-19 recently. EXAM: PORTABLE CHEST 1 VIEW COMPARISON:  06/24/2018 and earlier. FINDINGS: Portable AP upright views at 0554 hours. Stable endotracheal tube tip at the level the clavicles. Enteric tube courses to the abdomen, tip not included. Stable right PICC line. Mildly improved lung volumes. Stable cardiac size and mediastinal contours. No pneumothorax. Stable pulmonary vascularity without overt edema. No pleural effusion. Mild patchy lung base opacity most resembles atelectasis. Paucity of bowel gas in the upper abdomen. No acute osseous abnormality identified. IMPRESSION: 1. Stable lines and tubes. 2. Improved lung volumes with mild lung base atelectasis. Electronically Signed   By: Genevie Ann M.D.   On: 06/25/2018 08:02   Dg Chest Port 1 View  Result Date:  06/24/2018 CLINICAL DATA:  Followup ventilator support EXAM: PORTABLE CHEST 1 VIEW COMPARISON:  06/22/2018 FINDINGS: Endotracheal tube tip is 3 cm above the carina. Orogastric or nasogastric tube extends at least to the distal esophagus. Right-sided central line tip at the SVC RA junction. The patient is rotated towards the right. The left lung appears clear. There appears to be volume loss and infiltrate in the right lung. IMPRESSION: Patient rotated towards the right. Possible worsening of infiltrate in volume loss in the right lung. Orogastric or nasogastric tube tip not clearly seen due to underpenetration. Electronically Signed   By: Nelson Chimes M.D.   On: 06/24/2018 10:40   Dg Chest Port 1 View  Result Date: 06/22/2018 CLINICAL DATA:  Respiratory failure. EXAM: PORTABLE CHEST 1 VIEW COMPARISON:  06/21/2018 FINDINGS: The patient is rotated to the left. A right PICC has been placed and terminates over the lower SVC. An endotracheal tube terminates just below the clavicles, unchanged. An enteric tube is suboptimally visualized though now appears to extend into the upper abdomen. The cardiomediastinal silhouette is unchanged. Lung volumes remain low without evidence of airspace consolidation, edema, pleural effusion, pneumothorax. IMPRESSION: 1. Support devices including new right PICC as above. 2. Low lung volumes without evidence of acute airspace disease. Electronically Signed   By: Logan Bores M.D.   On: 06/22/2018 08:16   Dg Chest Port 1 View  Result Date: 06/21/2018 CLINICAL DATA:  Respiratory failure. EXAM: PORTABLE CHEST 1 VIEW COMPARISON:  Radiograph of Jun 20, 2018. FINDINGS: The heart size and mediastinal contours are within normal limits. Endotracheal tube is unchanged in position. Nasogastric tube appears to be looped within the distal esophagus with tip directed back up into the more proximal esophagus. No acute pulmonary disease is noted. No pneumothorax or pleural effusion is noted.  Atherosclerosis of thoracic aorta is noted. The visualized skeletal structures are unremarkable. IMPRESSION: Endotracheal tube is unchanged in position. Nasogastric tube is looped within distal esophagus, with distal tip directed into the more proximal esophagus. No acute cardiopulmonary abnormality seen. Aortic Atherosclerosis (ICD10-I70.0). Electronically Signed   By: Marijo Conception M.D.   On: 06/21/2018 08:48   Dg Chest Port 1 View  Result Date: 06/20/2018 CLINICAL DATA:  85 year old female with repositioning of the endotracheal tube EXAM: PORTABLE CHEST 1 VIEW  COMPARISON:  06/20/2018 FINDINGS: Cardiomediastinal silhouette unchanged in size and contour. No evidence of central vascular congestion. The endotracheal tube has been slightly withdrawn, now terminating 2.7 cm above the carina. There is a linear contour of the right lung of uncertain significance. There are overlying surgical lines tubes in EKG leads of the right chest somewhat obscuring evaluation. Low lung volumes. Pleuroparenchymal opacity of the right lung, may represent chronic changes and some pleural fluid. No new airspace disease. IMPRESSION: Endotracheal tube has been slightly withdrawn now terminating 2.7 cm above the carina. There is linear contour of the right lung extending in a convex configuration towards the costophrenic angle. While this is favored to represent artifact, a right pneumothorax cannot be excluded. Correlation with physical exam may be useful, as well as a repeat chest x-ray with adequate positioning and removal of the obscuring surgical line/hardware and EKG leads. These results were discussed by telephone at the time of interpretation on 06/20/2018 at 9:21 am with the nurse caring for the patient, Ms Sherry Ruffing. Electronically Signed   By: Corrie Mckusick D.O.   On: 06/20/2018 09:21   Dg Chest Port 1 View  Result Date: 06/20/2018 CLINICAL DATA:  Unresponsive. EXAM: PORTABLE CHEST 1 VIEW COMPARISON:  Radiograph Jun 19, 2018. FINDINGS: The heart size and mediastinal contours are within normal limits. Atherosclerosis of thoracic aorta is noted. Endotracheal tube is directed into right mainstem bronchus; withdrawal by 3-4 cm is recommended. Both lungs are clear. The visualized skeletal structures are unremarkable. IMPRESSION: Endotracheal tube directed into right mainstem bronchus; withdrawal by 3-4 cm is recommended. No acute cardiopulmonary abnormality seen. These results will be called to the ordering clinician or representative by the Radiologist Assistant, and communication documented in the PACS or zVision Dashboard. Electronically Signed   By: Marijo Conception M.D.   On: 06/20/2018 08:16   Dg Chest Portable 1 View  Result Date: 07/10/2018 CLINICAL DATA:  Unresponsive. EXAM: PORTABLE CHEST 1 VIEW COMPARISON:  06/27/2010 FINDINGS: 1301 hours. Endotracheal tube tip is approximately 1.4 cm above the base of the carina. The lungs are clear without focal pneumonia, edema, pneumothorax or pleural effusion. The cardiopericardial silhouette is within normal limits for size. The visualized bony structures of the thorax are intact. Telemetry leads overlie the chest. IMPRESSION: No active disease. Electronically Signed   By: Misty Stanley M.D.   On: 07/05/2018 13:23   Dg Chest Port 1v Same Day  Result Date: 06/20/2018 CLINICAL DATA:  Intubated patient. Possible pneumothorax on plain film of the chest earlier today. EXAM: PORTABLE CHEST 1 VIEW COMPARISON:  Single-view of the chest earlier today. FINDINGS: Endotracheal tube is in place with the tip well above the carina. There is no pneumothorax. Finding described on the prior report was likely a skin fold. Lungs clear. Heart size normal. IMPRESSION: Negative for pneumothorax.  No acute disease. ETT in good position. Electronically Signed   By: Inge Rise M.D.   On: 06/20/2018 10:16   Dg Addison Bailey G Tube Plc W/fl W/rad  Result Date: 06/23/2018 CLINICAL DATA:  Difficulty  advancing OG tube into the stomach EXAM: NASO G TUBE PLACEMENT WITH FL AND WITH RAD CONTRAST:  20 mL Omnipaque 300 FLUOROSCOPY TIME:  Fluoroscopy Time:  5 seconds Radiation Exposure Index (if provided by the fluoroscopic device): Number of Acquired Spot Images: 0 COMPARISON:  Abdomen film Jun 21, 2018 FINDINGS: Fluoroscopy over the lower chest and upper abdomen demonstrates the tube to have advanced since prior KUB with the tip in  the proximal stomach. This was advanced slightly further with the tip now in the mid to distal stomach. Contrast was injected to confirm placement. IMPRESSION: OG tube advanced into the mid to distal stomach. Electronically Signed   By: Rolm Baptise M.D.   On: 06/23/2018 15:49    Barton Dubois, MD  [facility] Pager [phone]  06/26/2018, 6:22 PM   LOS: 7 days

## 2018-06-26 NOTE — Progress Notes (Signed)
Subjective: She remains intubated and on the ventilator.  Poor if any response.  Objective: Vital signs in last 24 hours: Temp:  [94.3 F (34.6 C)-100.2 F (37.9 C)] 99.9 F (37.7 C) (05/14 0741) Resp:  [14-32] 25 (05/14 0741) BP: (108-176)/(40-126) 149/64 (05/14 0500) SpO2:  [97 %] 97 % (05/13 2316) FiO2 (%):  [40 %] 40 % (05/14 0428) Weight:  [67.5 kg] 67.5 kg (05/14 0500) Weight change: 0 kg Last BM Date: 06/25/18  Intake/Output from previous day: 05/13 0701 - 05/14 0700 In: 1326.7 [NG/GT:987.3; IV Piggyback:339.3] Out: 750 [Urine:750]  PHYSICAL EXAM General appearance: Intubated on the ventilator little if any response Resp: rhonchi bilaterally Cardio: regular rate and rhythm, S1, S2 normal, no murmur, click, rub or gallop GI: soft, non-tender; bowel sounds normal; no masses,  no organomegaly Extremities: She has some generalized puffiness of the extremities  Lab Results:  Results for orders placed or performed during the hospital encounter of 07/11/2018 (from the past 48 hour(s))  Glucose, capillary     Status: Abnormal   Collection Time: 06/24/18  7:54 AM  Result Value Ref Range   Glucose-Capillary 200 (H) 70 - 99 mg/dL  Glucose, capillary     Status: Abnormal   Collection Time: 06/24/18 11:41 AM  Result Value Ref Range   Glucose-Capillary 210 (H) 70 - 99 mg/dL  Glucose, capillary     Status: Abnormal   Collection Time: 06/24/18  3:51 PM  Result Value Ref Range   Glucose-Capillary 249 (H) 70 - 99 mg/dL   Comment 1 Notify RN    Comment 2 Document in Chart   Procalcitonin - Baseline     Status: None   Collection Time: 06/24/18  5:28 PM  Result Value Ref Range   Procalcitonin <0.10 ng/mL    Comment:        Interpretation: PCT (Procalcitonin) <= 0.5 ng/mL: Systemic infection (sepsis) is not likely. Local bacterial infection is possible. (NOTE)       Sepsis PCT Algorithm           Lower Respiratory Tract                                      Infection PCT  Algorithm    ----------------------------     ----------------------------         PCT < 0.25 ng/mL                PCT < 0.10 ng/mL         Strongly encourage             Strongly discourage   discontinuation of antibiotics    initiation of antibiotics    ----------------------------     -----------------------------       PCT 0.25 - 0.50 ng/mL            PCT 0.10 - 0.25 ng/mL               OR       >80% decrease in PCT            Discourage initiation of                                            antibiotics      Encourage discontinuation  of antibiotics    ----------------------------     -----------------------------         PCT >= 0.50 ng/mL              PCT 0.26 - 0.50 ng/mL               AND        <80% decrease in PCT             Encourage initiation of                                             antibiotics       Encourage continuation           of antibiotics    ----------------------------     -----------------------------        PCT >= 0.50 ng/mL                  PCT > 0.50 ng/mL               AND         increase in PCT                  Strongly encourage                                      initiation of antibiotics    Strongly encourage escalation           of antibiotics                                     -----------------------------                                           PCT <= 0.25 ng/mL                                                 OR                                        > 80% decrease in PCT                                     Discontinue / Do not initiate                                             antibiotics Performed at [facility], [address], [place], [place] [address]   Glucose, capillary     Status: Abnormal   Collection Time: 06/24/18  7:36 PM  Result Value Ref Range   Glucose-Capillary 235 (H) 70 - 99 mg/dL  Glucose, capillary  Status: Abnormal   Collection Time: 06/24/18 11:44 PM  Result Value Ref Range    Glucose-Capillary 258 (H) 70 - 99 mg/dL  Blood gas, arterial     Status: Abnormal   Collection Time: 06/25/18  4:30 AM  Result Value Ref Range   FIO2 40.00    pH, Arterial 7.440 7.350 - 7.450   pCO2 arterial 31.4 (L) 32.0 - 48.0 mmHg   pO2, Arterial 129 (H) 83.0 - 108.0 mmHg   Bicarbonate 22.7 20.0 - 28.0 mmol/L   Acid-base deficit 2.5 (H) 0.0 - 2.0 mmol/L   O2 Saturation 98.3 %   Patient temperature 37.8    Allens test (pass/fail) PASS PASS    Comment: Performed at [facility], [address] [place], [place] [address]  Glucose, capillary     Status: Abnormal   Collection Time: 06/25/18  4:33 AM  Result Value Ref Range   Glucose-Capillary 206 (H) 70 - 99 mg/dL  Procalcitonin     Status: None   Collection Time: 06/25/18  5:19 AM  Result Value Ref Range   Procalcitonin 0.69 ng/mL    Comment:        Interpretation: PCT > 0.5 ng/mL and <= 2 ng/mL: Systemic infection (sepsis) is possible, but other conditions are known to elevate PCT as well. (NOTE)       Sepsis PCT Algorithm           Lower Respiratory Tract                                      Infection PCT Algorithm    ----------------------------     ----------------------------         PCT < 0.25 ng/mL                PCT < 0.10 ng/mL         Strongly encourage             Strongly discourage   discontinuation of antibiotics    initiation of antibiotics    ----------------------------     -----------------------------       PCT 0.25 - 0.50 ng/mL            PCT 0.10 - 0.25 ng/mL               OR       >80% decrease in PCT            Discourage initiation of                                            antibiotics      Encourage discontinuation           of antibiotics    ----------------------------     -----------------------------         PCT >= 0.50 ng/mL              PCT 0.26 - 0.50 ng/mL                AND       <80% decrease in PCT             Encourage initiation of  antibiotics       Encourage continuation           of antibiotics    ----------------------------     -----------------------------        PCT >= 0.50 ng/mL                  PCT > 0.50 ng/mL               AND         increase in PCT                  Strongly encourage                                      initiation of antibiotics    Strongly encourage escalation           of antibiotics                                     -----------------------------                                           PCT <= 0.25 ng/mL                                                 OR                                        > 80% decrease in PCT                                     Discontinue / Do not initiate                                             antibiotics Performed at [facility], [address], [place], [place] [address]   Basic metabolic panel     Status: Abnormal   Collection Time: 06/25/18  5:19 AM  Result Value Ref Range   Sodium 135 135 - 145 mmol/L   Potassium 4.8 3.5 - 5.1 mmol/L   Chloride 107 98 - 111 mmol/L   CO2 20 (L) 22 - 32 mmol/L   Glucose, Bld 291 (H) 70 - 99 mg/dL   BUN 20 8 - 23 mg/dL   Creatinine, Ser 0.82 0.44 - 1.00 mg/dL   Calcium 8.7 (L) 8.9 - 10.3 mg/dL   GFR calc non Af Amer >60 >60 mL/min   GFR calc Af Amer >60 >60 mL/min   Anion gap 8 5 - 15    Comment: Performed at [facility], [address], [place], [place] [address]  CBC     Status: Abnormal   Collection Time: 06/25/18  5:19 AM  Result Value Ref Range   WBC 13.5 (H) 4.0 -  10.5 K/uL   RBC 3.44 (L) 3.87 - 5.11 MIL/uL   Hemoglobin 10.4 (L) 12.0 - 15.0 g/dL   HCT 31.3 (L) 36.0 - 46.0 %   MCV 91.0 80.0 - 100.0 fL   MCH 30.2 26.0 - 34.0 pg   MCHC 33.2 30.0 - 36.0 g/dL   RDW 13.9 11.5 - 15.5 %   Platelets 162 150 - 400 K/uL   nRBC 0.0 0.0 - 0.2 %    Comment: Performed at [facility], [address] [place], [place] [address]  Glucose, capillary     Status: Abnormal   Collection Time: 06/25/18  7:51 AM   Result Value Ref Range   Glucose-Capillary 237 (H) 70 - 99 mg/dL  Glucose, capillary     Status: Abnormal   Collection Time: 06/25/18 11:24 AM  Result Value Ref Range   Glucose-Capillary 229 (H) 70 - 99 mg/dL  Glucose, capillary     Status: Abnormal   Collection Time: 06/25/18  4:15 PM  Result Value Ref Range   Glucose-Capillary 241 (H) 70 - 99 mg/dL  Glucose, capillary     Status: Abnormal   Collection Time: 06/25/18  8:40 PM  Result Value Ref Range   Glucose-Capillary 201 (H) 70 - 99 mg/dL   Comment 1 Notify RN    Comment 2 Document in Chart   Glucose, capillary     Status: Abnormal   Collection Time: 06/26/18 12:39 AM  Result Value Ref Range   Glucose-Capillary 149 (H) 70 - 99 mg/dL   Comment 1 Notify RN    Comment 2 Document in Chart   Blood gas, arterial     Status: Abnormal   Collection Time: 06/26/18  4:34 AM  Result Value Ref Range   FIO2 40.00    Delivery systems VENTILATOR    Mode PRESSURE REGULATED VOLUME CONTROL    VT 410 mL   LHR 16 resp/min   Peep/cpap 5.0 cm H20   pH, Arterial 7.452 (H) 7.350 - 7.450   pCO2 arterial 34.0 32.0 - 48.0 mmHg   pO2, Arterial 113 (H) 83.0 - 108.0 mmHg   Bicarbonate 24.6 20.0 - 28.0 mmol/L   Acid-base deficit 0.1 0.0 - 2.0 mmol/L   O2 Saturation 98.1 %   Patient temperature 37.5    Collection site RIGHT RADIAL    Drawn by [id number]    Allens test (pass/fail) PASS PASS    Comment: Performed at [facility], [address] [place], [place] [address]  Procalcitonin     Status: None   Collection Time: 06/26/18  4:37 AM  Result Value Ref Range   Procalcitonin 0.83 ng/mL    Comment:        Interpretation: PCT > 0.5 ng/mL and <= 2 ng/mL: Systemic infection (sepsis) is possible, but other conditions are known to elevate PCT as well. (NOTE)       Sepsis PCT Algorithm           Lower Respiratory Tract                                      Infection PCT Algorithm    ----------------------------     ----------------------------          PCT < 0.25 ng/mL                PCT < 0.10 ng/mL         Strongly  encourage             Strongly discourage   discontinuation of antibiotics    initiation of antibiotics    ----------------------------     -----------------------------       PCT 0.25 - 0.50 ng/mL            PCT 0.10 - 0.25 ng/mL               OR       >80% decrease in PCT            Discourage initiation of                                            antibiotics      Encourage discontinuation           of antibiotics    ----------------------------     -----------------------------         PCT >= 0.50 ng/mL              PCT 0.26 - 0.50 ng/mL                AND       <80% decrease in PCT             Encourage initiation of                                             antibiotics       Encourage continuation           of antibiotics    ----------------------------     -----------------------------        PCT >= 0.50 ng/mL                  PCT > 0.50 ng/mL               AND         increase in PCT                  Strongly encourage                                      initiation of antibiotics    Strongly encourage escalation           of antibiotics                                     -----------------------------                                           PCT <= 0.25 ng/mL                                                 OR                                        >  80% decrease in PCT                                     Discontinue / Do not initiate                                             antibiotics Performed at [facility], [address], [place], [place] [address]   Glucose, capillary     Status: Abnormal   Collection Time: 06/26/18  4:55 AM  Result Value Ref Range   Glucose-Capillary 240 (H) 70 - 99 mg/dL   Comment 1 Notify RN    Comment 2 Document in Chart   Glucose, capillary     Status: Abnormal   Collection Time: 06/26/18  7:38 AM  Result Value Ref Range   Glucose-Capillary 157 (H) 70 - 99 mg/dL     ABGS Recent Labs    06/26/18 0434  PHART 7.452*  PO2ART 113*  HCO3 24.6   CULTURES Recent Results (from the past 240 hour(s))  SARS Coronavirus 2 (CEPHEID - Performed in [facility] hospital lab), Hosp Order     Status: None   Collection Time: 06/20/2018 12:15 PM  Result Value Ref Range Status   SARS Coronavirus 2 NEGATIVE NEGATIVE Final    Comment: (NOTE) If result is NEGATIVE SARS-CoV-2 target nucleic acids are NOT DETECTED. The SARS-CoV-2 RNA is generally detectable in upper and lower  respiratory specimens during the acute phase of infection. The lowest  concentration of SARS-CoV-2 viral copies this assay can detect is 250  copies / mL. A negative result does not preclude SARS-CoV-2 infection  and should not be used as the sole basis for treatment or other  patient management decisions.  A negative result may occur with  improper specimen collection / handling, submission of specimen other  than nasopharyngeal swab, presence of viral mutation(s) within the  areas targeted by this assay, and inadequate number of viral copies  (<250 copies / mL). A negative result must be combined with clinical  observations, patient history, and epidemiological information. If result is POSITIVE SARS-CoV-2 target nucleic acids are DETECTED. The SARS-CoV-2 RNA is generally detectable in upper and lower  respiratory specimens dur ing the acute phase of infection.  Positive  results are indicative of active infection with SARS-CoV-2.  Clinical  correlation with patient history and other diagnostic information is  necessary to determine patient infection status.  Positive results do  not rule out bacterial infection or co-infection with other viruses. If result is PRESUMPTIVE POSTIVE SARS-CoV-2 nucleic acids MAY BE PRESENT.   A presumptive positive result was obtained on the submitted specimen  and confirmed on repeat testing.  While 2019 novel coronavirus  (SARS-CoV-2) nucleic acids may  be present in the submitted sample  additional confirmatory testing may be necessary for epidemiological  and / or clinical management purposes  to differentiate between  SARS-CoV-2 and other Sarbecovirus currently known to infect humans.  If clinically indicated additional testing with an alternate test  methodology [phone]) is advised. The SARS-CoV-2 RNA is generally  detectable in upper and lower respiratory sp ecimens during the acute  phase of infection. The expected result is Negative. Fact Sheet for Patients:  StrictlyIdeas.no Fact Sheet for Healthcare Providers: BankingDealers.co.za This test is not yet approved or  cleared by the [place] and has been authorized for detection and/or diagnosis of SARS-CoV-2 by FDA under an Emergency Use Authorization (EUA).  This EUA will remain in effect (meaning this test can be used) for the duration of the COVID-19 declaration under Section 564(b)(1) of the Act, 21 U.S.C. section 360bbb-3(b)(1), unless the authorization is terminated or revoked sooner. Performed at [facility], [address], [place], [place] [address]   Urine culture     Status: Abnormal   Collection Time: 06/21/2018  2:38 PM  Result Value Ref Range Status   Specimen Description   Final    URINE, CLEAN CATCH Performed at [facility], [address] [place], [place] [address]    Special Requests   Final    NONE Performed at [facility], [address] [place], [place] [address]    Culture 90,000 COLONIES/mL ESCHERICHIA COLI (A)  Final   Report Status 06/21/2018 FINAL  Final   Organism ID, Bacteria ESCHERICHIA COLI (A)  Final      Susceptibility   Escherichia coli - MIC*    AMPICILLIN 4 SENSITIVE Sensitive     CEFAZOLIN <=4 SENSITIVE Sensitive     CEFTRIAXONE <=1 SENSITIVE Sensitive     CIPROFLOXACIN >=4 RESISTANT Resistant     GENTAMICIN <=1 SENSITIVE Sensitive     IMIPENEM <=0.25 SENSITIVE Sensitive      NITROFURANTOIN <=16 SENSITIVE Sensitive     TRIMETH/SULFA <=20 SENSITIVE Sensitive     AMPICILLIN/SULBACTAM <=2 SENSITIVE Sensitive     PIP/TAZO <=4 SENSITIVE Sensitive     Extended ESBL NEGATIVE Sensitive     * 90,000 COLONIES/mL ESCHERICHIA COLI  MRSA PCR Screening     Status: None   Collection Time: 06/25/2018  7:13 PM  Result Value Ref Range Status   MRSA by PCR NEGATIVE NEGATIVE Final    Comment:        The GeneXpert MRSA Assay (FDA approved for NASAL specimens only), is one component of a comprehensive MRSA colonization surveillance program. It is not intended to diagnose MRSA infection nor to guide or monitor treatment for MRSA infections. Performed at [facility], [address] [place], [place] [address]   Culture, blood (routine x 2)     Status: None   Collection Time: 07/01/2018  9:04 PM  Result Value Ref Range Status   Specimen Description BLOOD LEFT HAND  Final   Special Requests   Final    BOTTLES DRAWN AEROBIC AND ANAEROBIC Blood Culture adequate volume   Culture   Final    NO GROWTH 5 DAYS Performed at [facility], [address], [place], [place] [address]    Report Status 06/24/2018 FINAL  Final  Culture, blood (routine x 2)     Status: None   Collection Time: 06/17/2018  9:05 PM  Result Value Ref Range Status   Specimen Description BLOOD RIGHT HAND  Final   Special Requests   Final    BOTTLES DRAWN AEROBIC AND ANAEROBIC Blood Culture adequate volume   Culture   Final    NO GROWTH 5 DAYS Performed at [facility], [address] [place], [place] [address]    Report Status 06/24/2018 FINAL  Final   Studies/Results: Dg Chest Port 1 View  Result Date: 06/25/2018 CLINICAL DATA:  85 year old female with encephalopathy, intubated. Negative for COVID-19 recently. EXAM: PORTABLE CHEST 1 VIEW COMPARISON:  06/24/2018 and earlier. FINDINGS: Portable AP upright views at 0554 hours. Stable endotracheal tube tip at the level the clavicles. Enteric  tube courses to the  abdomen, tip not included. Stable right PICC line. Mildly improved lung volumes. Stable cardiac size and mediastinal contours. No pneumothorax. Stable pulmonary vascularity without overt edema. No pleural effusion. Mild patchy lung base opacity most resembles atelectasis. Paucity of bowel gas in the upper abdomen. No acute osseous abnormality identified. IMPRESSION: 1. Stable lines and tubes. 2. Improved lung volumes with mild lung base atelectasis. Electronically Signed   By: Genevie Ann M.D.   On: 06/25/2018 08:02   Dg Chest Port 1 View  Result Date: 06/24/2018 CLINICAL DATA:  Followup ventilator support EXAM: PORTABLE CHEST 1 VIEW COMPARISON:  06/22/2018 FINDINGS: Endotracheal tube tip is 3 cm above the carina. Orogastric or nasogastric tube extends at least to the distal esophagus. Right-sided central line tip at the SVC RA junction. The patient is rotated towards the right. The left lung appears clear. There appears to be volume loss and infiltrate in the right lung. IMPRESSION: Patient rotated towards the right. Possible worsening of infiltrate in volume loss in the right lung. Orogastric or nasogastric tube tip not clearly seen due to underpenetration. Electronically Signed   By: Nelson Chimes M.D.   On: 06/24/2018 10:40    Medications:  Prior to Admission:  Medications Prior to Admission  Medication Sig Dispense Refill Last Dose  . acetaminophen (TYLENOL) 650 MG CR tablet Take 650 mg by mouth 2 (two) times daily as needed for pain.   Taking  . carbidopa-levodopa (SINEMET IR) 10-100 MG tablet Take 1 Table tby mouth 3 Times Daily As Directed for Parkinson's Disease   Taking  . clopidogrel (PLAVIX) 75 MG tablet Take 75 mg by mouth at bedtime.     at unknown  . furosemide (LASIX) 40 MG tablet 40 mg. Take 1/2tablet by mouth twice daily   Taking  . glipiZIDE (GLUCOTROL XL) 10 MG 24 hr tablet TAKE  (1)  TABLET BY MOUTH TWICE A DAY.   Taking  . levothyroxine (SYNTHROID) 100 MCG tablet Take 1 tablet by  mouth daily.     Marland Kitchen linagliptin (TRADJENTA) 5 MG TABS tablet Take 5 mg by mouth daily.    Taking  . lisinopril (ZESTRIL) 20 MG tablet Take 1 tablet by mouth daily.    at unknown  . pramipexole (MIRAPEX) 0.75 MG tablet Take 0.75 mg by mouth 3 (three) times daily.   Taking  . pravastatin (PRAVACHOL) 40 MG tablet Take 1 tablet by mouth daily.   Taking  . pregabalin (LYRICA) 100 MG capsule Take 100 mg by mouth 3 (three) times daily.    Taking  . selegiline (ELDEPRYL) 5 MG capsule Take 5 mg by mouth 2 (two) times daily.    Taking  . Vitamin D, Ergocalciferol, (DRISDOL) 50000 UNITS CAPS capsule Take 1 capsule by mouth once a week.   Taking  . tiZANidine (ZANAFLEX) 4 MG tablet 1/2 or 1 Tablet by mout Up To 4 Times Per Day for Muscle Spasms. May Cause Drowsiness   Taking   Scheduled: . aspirin  300 mg Rectal Daily  . carbidopa-levodopa  2 tablet Oral TID  . chlorhexidine gluconate (MEDLINE KIT)  15 mL Mouth Rinse BID  . Chlorhexidine Gluconate Cloth  6 each Topical Q0600  . feeding supplement (OSMOLITE 1.5 CAL)  1,000 mL Per Tube Q24H  . insulin aspart  0-9 Units Subcutaneous Q4H  . insulin aspart  2 Units Subcutaneous Q4H  . levothyroxine  50 mcg Intravenous Daily  . mouth rinse  15 mL Mouth Rinse 10  times per day  . rotigotine  1 patch Transdermal Daily   Continuous: . ceFEPime (MAXIPIME) IV 2 g (06/25/18 2120)  . dextrose 5 % and 0.45 % NaCl with KCl 20 mEq/L Stopped (06/24/18 1643)  . famotidine (PEPCID) IV 20 mg (06/25/18 1014)  . levETIRAcetam 500 mg (06/25/18 1940)  . metronidazole 500 mg (06/26/18 0130)   FAW:NOPWKHIPRKSYS, albuterol, fentaNYL (SUBLIMAZE) injection, fentaNYL (SUBLIMAZE) injection, ondansetron (ZOFRAN) IV  Assesment: She was admitted with acute encephalopathy that appears to be multifactorial.  No evidence of stroke on CT.  No definite seizure focus on EEG.  She has been on antibiotics and appears to have a UTI and pneumonia but that actually looks better.  She is  known to have Parkinson's disease at baseline and she is on medications but it does not seem to have made much difference  She had rhabdomyolysis and had improvement in that  She had elevated troponin presumed to be secondary to demand ischemia  Her urinary tract infection is from E. Coli  She had B12 deficiency and she is having that replaced Active Problems:   Lower urinary tract infectious disease   Unresponsiveness   Acute encephalopathy   Acute respiratory failure with hypoxia (HCC)   Rhabdomyolysis   Feeding difficulty in elderly   Goals of care, counseling/discussion   Palliative care encounter   Acute cystitis with hematuria    Plan: No opportunity for weaning.  I do not think she can protect her airway.  Per Dr. Merlene Laughter may need to consider more comfort care as she has not improved with current treatments    LOS: 7 days   Alonza Bogus 06/26/2018, 7:51 AM

## 2018-06-26 NOTE — Progress Notes (Addendum)
Harwood A. Merlene Laughter, MD     [email or web address]          KADELYN [name] is an 85 y.o. female.   Assessment/Plan: 1.  Acute encephalopathy: Multifactorial but NO meaningful improvement with treatments - consider deceleration of /comfort measures.   2.  Parkinson disease: She is mildly less parkinsonian today.   I will stop reglan for now - make PD worse. Consider erythromycin instead.   3.  Vitamin B-12 deficiency: This will be replaced.     GENERAL: This is a thin lady who is intubated.    HEENT: Neck is supple no trauma appreciated  EXTREMITIES: No edema   BACK: Normal  SKIN: Normal by inspection.    MENTAL STATUS: There is partial eye opening.  There is no verbal output or attempt to follow commands.  CRANIAL NERVES: Pupils are equal, round and reactive to light;  she has roving eye movements in both directions. Extra ocular movements are full with oculocephalic reflexes, upper and lower facial muscles are normal in strength and symmetric, there is no flattening of the nasolabial folds, Corneal reflexes markedly reduced.   MOTOR: There is mild flexion withdrawal to deep painful stimuli bilaterally.  She is somewhat more responsive involving the right upper extremity.  COORDINATION: No tremor is noted.  There is moderate rigidity bilaterally and bradykinesia.  REFLEXES: Deep tendon reflexes are symmetrical and normal.  Plantar responses are classically upgoing on the left and equivocal on the right.  SENSATION: Normal to pain.    EEG shows moderate slowing.  There is also frontocentral delta slowing episodically.   Objective: Vital signs in last 24 hours: Temp:  [94.3 F (34.6 C)-100.4 F (38 C)] 99.7 F (37.6 C) (05/14 0500) Resp:  [14-32] 30 (05/14 0500) BP: (108-176)/(40-126) 149/64 (05/14 0500) SpO2:  [97 %] 97 % (05/13 2316) FiO2 (%):  [40 %] 40 % (05/14 0428)  Intake/Output from previous day: 05/13 0701 - 05/14 0700  In: 1326.7 [NG/GT:987.3; IV Piggyback:339.3] Out: 300 [Urine:300] Intake/Output this shift: No intake/output data recorded. Nutritional status:  Diet Order    None       Lab Results: Results for orders placed or performed during the hospital encounter of 07/02/2018 (from the past 48 hour(s))  Glucose, capillary     Status: Abnormal   Collection Time: 06/24/18  7:54 AM  Result Value Ref Range   Glucose-Capillary 200 (H) 70 - 99 mg/dL  Glucose, capillary     Status: Abnormal   Collection Time: 06/24/18 11:41 AM  Result Value Ref Range   Glucose-Capillary 210 (H) 70 - 99 mg/dL  Glucose, capillary     Status: Abnormal   Collection Time: 06/24/18  3:51 PM  Result Value Ref Range   Glucose-Capillary 249 (H) 70 - 99 mg/dL   Comment 1 Notify RN    Comment 2 Document in Chart   Procalcitonin - Baseline     Status: None   Collection Time: 06/24/18  5:28 PM  Result Value Ref Range   Procalcitonin <0.10 ng/mL    Comment:        Interpretation: PCT (Procalcitonin) <= 0.5 ng/mL: Systemic infection (sepsis) is not likely. Local bacterial infection is possible. (NOTE)       Sepsis PCT Algorithm           Lower Respiratory Tract  Infection PCT Algorithm    ----------------------------     ----------------------------         PCT < 0.25 ng/mL                PCT < 0.10 ng/mL         Strongly encourage             Strongly discourage   discontinuation of antibiotics    initiation of antibiotics    ----------------------------     -----------------------------       PCT 0.25 - 0.50 ng/mL            PCT 0.10 - 0.25 ng/mL               OR       >80% decrease in PCT            Discourage initiation of                                            antibiotics      Encourage discontinuation           of antibiotics    ----------------------------     -----------------------------         PCT >= 0.50 ng/mL              PCT 0.26 - 0.50 ng/mL               AND         <80% decrease in PCT             Encourage initiation of                                             antibiotics       Encourage continuation           of antibiotics    ----------------------------     -----------------------------        PCT >= 0.50 ng/mL                  PCT > 0.50 ng/mL               AND         increase in PCT                  Strongly encourage                                      initiation of antibiotics    Strongly encourage escalation           of antibiotics                                     -----------------------------                                           PCT <= 0.25 ng/mL  OR                                        > 80% decrease in PCT                                     Discontinue / Do not initiate                                             antibiotics Performed at [facility], [address], [place], [place] [address]   Glucose, capillary     Status: Abnormal   Collection Time: 06/24/18  7:36 PM  Result Value Ref Range   Glucose-Capillary 235 (H) 70 - 99 mg/dL  Glucose, capillary     Status: Abnormal   Collection Time: 06/24/18 11:44 PM  Result Value Ref Range   Glucose-Capillary 258 (H) 70 - 99 mg/dL  Blood gas, arterial     Status: Abnormal   Collection Time: 06/25/18  4:30 AM  Result Value Ref Range   FIO2 40.00    pH, Arterial 7.440 7.350 - 7.450   pCO2 arterial 31.4 (L) 32.0 - 48.0 mmHg   pO2, Arterial 129 (H) 83.0 - 108.0 mmHg   Bicarbonate 22.7 20.0 - 28.0 mmol/L   Acid-base deficit 2.5 (H) 0.0 - 2.0 mmol/L   O2 Saturation 98.3 %   Patient temperature 37.8    Allens test (pass/fail) PASS PASS    Comment: Performed at [facility], [address], [place], [place] [address]  Glucose, capillary     Status: Abnormal   Collection Time: 06/25/18  4:33 AM  Result Value Ref Range   Glucose-Capillary 206 (H) 70 - 99 mg/dL  Procalcitonin     Status: None   Collection Time:  06/25/18  5:19 AM  Result Value Ref Range   Procalcitonin 0.69 ng/mL    Comment:        Interpretation: PCT > 0.5 ng/mL and <= 2 ng/mL: Systemic infection (sepsis) is possible, but other conditions are known to elevate PCT as well. (NOTE)       Sepsis PCT Algorithm           Lower Respiratory Tract                                      Infection PCT Algorithm    ----------------------------     ----------------------------         PCT < 0.25 ng/mL                PCT < 0.10 ng/mL         Strongly encourage             Strongly discourage   discontinuation of antibiotics    initiation of antibiotics    ----------------------------     -----------------------------       PCT 0.25 - 0.50 ng/mL            PCT 0.10 - 0.25 ng/mL               OR       >  80% decrease in PCT            Discourage initiation of                                            antibiotics      Encourage discontinuation           of antibiotics    ----------------------------     -----------------------------         PCT >= 0.50 ng/mL              PCT 0.26 - 0.50 ng/mL                AND       <80% decrease in PCT             Encourage initiation of                                             antibiotics       Encourage continuation           of antibiotics    ----------------------------     -----------------------------        PCT >= 0.50 ng/mL                  PCT > 0.50 ng/mL               AND         increase in PCT                  Strongly encourage                                      initiation of antibiotics    Strongly encourage escalation           of antibiotics                                     -----------------------------                                           PCT <= 0.25 ng/mL                                                 OR                                        > 80% decrease in PCT                                     Discontinue / Do not initiate  antibiotics Performed at [facility], [address], [place], [place] [address]   Basic metabolic panel     Status: Abnormal   Collection Time: 06/25/18  5:19 AM  Result Value Ref Range   Sodium 135 135 - 145 mmol/L   Potassium 4.8 3.5 - 5.1 mmol/L   Chloride 107 98 - 111 mmol/L   CO2 20 (L) 22 - 32 mmol/L   Glucose, Bld 291 (H) 70 - 99 mg/dL   BUN 20 8 - 23 mg/dL   Creatinine, Ser 0.82 0.44 - 1.00 mg/dL   Calcium 8.7 (L) 8.9 - 10.3 mg/dL   GFR calc non Af Amer >60 >60 mL/min   GFR calc Af Amer >60 >60 mL/min   Anion gap 8 5 - 15    Comment: Performed at [facility], [address] [place], [place] [address]  CBC     Status: Abnormal   Collection Time: 06/25/18  5:19 AM  Result Value Ref Range   WBC 13.5 (H) 4.0 - 10.5 K/uL   RBC 3.44 (L) 3.87 - 5.11 MIL/uL   Hemoglobin 10.4 (L) 12.0 - 15.0 g/dL   HCT 31.3 (L) 36.0 - 46.0 %   MCV 91.0 80.0 - 100.0 fL   MCH 30.2 26.0 - 34.0 pg   MCHC 33.2 30.0 - 36.0 g/dL   RDW 13.9 11.5 - 15.5 %   Platelets 162 150 - 400 K/uL   nRBC 0.0 0.0 - 0.2 %    Comment: Performed at [facility], [address] [place], [place] [address]  Glucose, capillary     Status: Abnormal   Collection Time: 06/25/18  7:51 AM  Result Value Ref Range   Glucose-Capillary 237 (H) 70 - 99 mg/dL  Glucose, capillary     Status: Abnormal   Collection Time: 06/25/18 11:24 AM  Result Value Ref Range   Glucose-Capillary 229 (H) 70 - 99 mg/dL  Glucose, capillary     Status: Abnormal   Collection Time: 06/25/18  4:15 PM  Result Value Ref Range   Glucose-Capillary 241 (H) 70 - 99 mg/dL  Glucose, capillary     Status: Abnormal   Collection Time: 06/25/18  8:40 PM  Result Value Ref Range   Glucose-Capillary 201 (H) 70 - 99 mg/dL   Comment 1 Notify RN    Comment 2 Document in Chart   Glucose, capillary     Status: Abnormal   Collection Time: 06/26/18 12:39 AM  Result Value Ref Range   Glucose-Capillary 149 (H) 70 - 99 mg/dL   Comment 1 Notify RN    Comment 2  Document in Chart   Blood gas, arterial     Status: Abnormal   Collection Time: 06/26/18  4:34 AM  Result Value Ref Range   FIO2 40.00    Delivery systems VENTILATOR    Mode PRESSURE REGULATED VOLUME CONTROL    VT 410 mL   LHR 16 resp/min   Peep/cpap 5.0 cm H20   pH, Arterial 7.452 (H) 7.350 - 7.450   pCO2 arterial 34.0 32.0 - 48.0 mmHg   pO2, Arterial 113 (H) 83.0 - 108.0 mmHg   Bicarbonate 24.6 20.0 - 28.0 mmol/L   Acid-base deficit 0.1 0.0 - 2.0 mmol/L   O2 Saturation 98.1 %   Patient temperature 37.5    Collection site RIGHT RADIAL    Drawn by [id number]    Allens test (pass/fail) PASS PASS    Comment: Performed at [facility], [address], [place], [place]  [address]  Glucose, capillary     Status: Abnormal   Collection Time: 06/26/18  4:55 AM  Result Value Ref Range   Glucose-Capillary 240 (H) 70 - 99 mg/dL   Comment 1 Notify RN    Comment 2 Document in Chart     Lipid Panel No results for input(s): CHOL, TRIG, HDL, CHOLHDL, VLDL, LDLCALC in the last 72 hours.  Studies/Results:  REPEAT HEAD CT  FINDINGS: Brain: Stable cerebral atrophy. Negative for acute hemorrhage, mass lesion, midline shift, hydrocephalus or large infarct. Again noted are hypodensities in the white matter suggesting chronic changes. Focal low-density in the right frontal-parietal white matter is compatible with old ischemic changes. Patient's head rotated on this examination.  Vascular: No hyperdense vessel or unexpected calcification.  Skull: Normal. Negative for fracture or focal lesion.  Sinuses/Orbits: No acute finding.  Other: None.  IMPRESSION: 1. No acute intracranial abnormality. 2. Stable atrophy and chronic white matter changes. Findings are most compatible with chronic small vessel ischemic changes.     Medications:  Scheduled Meds: . aspirin  300 mg Rectal Daily  . carbidopa-levodopa  2 tablet Oral TID  . chlorhexidine gluconate (MEDLINE KIT)  15 mL Mouth Rinse BID   . Chlorhexidine Gluconate Cloth  6 each Topical Q0600  . feeding supplement (OSMOLITE 1.5 CAL)  1,000 mL Per Tube Q24H  . insulin aspart  0-9 Units Subcutaneous Q4H  . insulin aspart  2 Units Subcutaneous Q4H  . levothyroxine  50 mcg Intravenous Daily  . mouth rinse  15 mL Mouth Rinse 10 times per day  . metoCLOPramide (REGLAN) injection  5 mg Intravenous Q8H  . rotigotine  1 patch Transdermal Daily   Continuous Infusions: . ceFEPime (MAXIPIME) IV 2 g (06/25/18 2120)  . dextrose 5 % and 0.45 % NaCl with KCl 20 mEq/L Stopped (06/24/18 1643)  . famotidine (PEPCID) IV 20 mg (06/25/18 1014)  . levETIRAcetam 500 mg (06/25/18 1940)  . metronidazole 500 mg (06/26/18 0130)   PRN Meds:.acetaminophen, albuterol, fentaNYL (SUBLIMAZE) injection, fentaNYL (SUBLIMAZE) injection, ondansetron (ZOFRAN) IV     LOS: 7 days   Kofi A. Merlene Laughter, M.D.  Diplomate, Tax adviser of Psychiatry and Neurology ( Neurology).

## 2018-06-26 NOTE — Progress Notes (Addendum)
Palliative:  Jennifer [name] is the same as yesterday. Continues with poor neurological status. Continues on ventilator.   I spoke further with Jennifer [name]. I explained to Jennifer [name] that she is not having any meaningful recovery and we are not seeing the response to her Parkinson's medication that we would hope. I also explained that she has been intubated 7 days now and we need to consider removing the tube and consider focusing on her comfort at this time. Jennifer [name] is very tearful and wants to ensure that she can have her family with her to say goodbye. She is requesting 7 people to be at bedside (Jennifer [name]/Jennifer [name] Jennifer [name], Jennifer [name]/Jennifer [name], Jennifer [name]). I spoke with ICU director who will help arrange visitation. Appropriate for all to be present for extubation. I did explain to Jennifer [name] that depending on how her mother does they may be asked to take turns visiting at some point. Jennifer [name] is appropriately tearful. We discussed DNR and put this in place. I spoke with her about expectation that her mother could live hours or days off the ventilator but our goal will be for her comfort and we will give her medication to assist with transition of the ventilator. She has multiple family members calling so I allowed her time to speak with them. Emotional support provided.   I requested RT to assist with weaning trial to try and get an idea of Ms. Gunnels's potential toleration of extubation. Vital signs remained stable without incident of tachypnea or tachycardia. Her main issue will be secretion management and airway protection.   Plan: - One way extubation to comfort Saturday 06/28/18 1000 family arranged to arrive to visit proceeded by extubation.  - Robinul scheduled to assist with copious secretions to allow for more comfortable transition off ventilator. If copious secretions continue would increase Robinul to 0.4 mg scheduled.  - Recommend morphine 2 mg and ativan 0.5-1 mg IV prior to  extubation and then also liberal PRN dosing of both to ensure comfort after extubation. - Recommend chaplain presence if available during time of extubation.    Unfortunately no palliative providers will be in person 06/27/18-06/29/18. However, you may call [phone] for recommendations as needed.   45 min  Yong Channel, NP Palliative Medicine Team Pager # [phone] (M-F 8a-5p) Team Phone # [phone] (Nights/Weekends)

## 2018-06-27 LAB — GLUCOSE, CAPILLARY
Glucose-Capillary: 118 mg/dL — ABNORMAL HIGH (ref 70–99)
Glucose-Capillary: 127 mg/dL — ABNORMAL HIGH (ref 70–99)
Glucose-Capillary: 166 mg/dL — ABNORMAL HIGH (ref 70–99)
Glucose-Capillary: 185 mg/dL — ABNORMAL HIGH (ref 70–99)
Glucose-Capillary: 195 mg/dL — ABNORMAL HIGH (ref 70–99)
Glucose-Capillary: 202 mg/dL — ABNORMAL HIGH (ref 70–99)
Glucose-Capillary: 204 mg/dL — ABNORMAL HIGH (ref 70–99)

## 2018-06-27 LAB — BLOOD GAS, ARTERIAL
Acid-Base Excess: 0.4 mmol/L (ref 0.0–2.0)
Bicarbonate: 25.1 mmol/L (ref 20.0–28.0)
FIO2: 40
O2 Saturation: 98.7 %
Patient temperature: 37
pCO2 arterial: 32.9 mmHg (ref 32.0–48.0)
pH, Arterial: 7.471 — ABNORMAL HIGH (ref 7.350–7.450)
pO2, Arterial: 133 mmHg — ABNORMAL HIGH (ref 83.0–108.0)

## 2018-06-27 NOTE — Progress Notes (Signed)
Palliative care input noted.  Plan is for compassionate extubation on Saturday, 06/28/2018.  I will follow more peripherally.  Thanks for allowing me to see her with you

## 2018-06-27 NOTE — Progress Notes (Signed)
PROGRESS NOTE  BRITNE BORELLI FYB:[id number] DOB: [birth year] DOA: 06/16/2018 PCP: [name], MD  Brief History: 85 year old female with a history of TIA, Parkinson's disease, hypothyroidism, hypertension, diabetes mellitus, hyperlipidemia, PSVT status post RFA 2006, and tobacco abuse in remission presented with unresponsiveness. The patient lives by herself, but her granddaughter intermittently checks up on her. On 06/18/2018, the patient granddaughter noted the patient to be lethargic, but felt that the patient was just tired and sleepy. She went back to check up on the patient on the morning of 07/01/2018 and noted the patient to be essentially unresponsive. EMS was activated. In the emergency department, the patient was intubated to protect her airway secondary to her encephalopathy. According to the patient's daughter, the patient has had a long history of poor compliance with her medications. It was felt in the past that her encephalopathy may have been partly attributed to not taking her Parkinson's medicines as well as he thyroid replacement. However, the patient has never been unresponsive. There is been no history of recent fevers, chills, complaints of chest pain, shortness breath, vomiting, diarrhea. At baseline, the patient has some pleasant confusion but is able to converse. She normally gets around in a wheelchair, but occasionally uses a walker. In the emergency department, the patient had temperature 100.4 F, but blood pressure as high as 208/73. EKG showed sinus tachycardia without any ST-T wave changes. CPK was 1065. UA showed 21-50 WBC. Otherwise BMP and CBC were essentially unremarkable. CT of the brain was negative for any acute findings. Chest x-ray was negative for any infiltrates. Neurology and pulmonarywereconsulted to assist with management.  Assessment/Plan: Acute encephalopathy, type unspecified -Etiology unclear although likely multifactorial  including UTI, B12 deficiency, possible seizure, levodopa withdraw -Appreciate neurology consult -07/13/2018 CT brain negative for acute findings -MRI of brain--cannot obtain while on ventilator -06/20/18 CT brain--neg for acute findings -Folic NIDP--8.2 -UMPNTIR--44 -TSH 0.462 -Urine drug screen negative -Personally reviewed EKG--sinus rhythm, no ST-T wave changes -06/23/2018--patient remainsminimally responsivetoprotopathic stimuli despite being off sedation. -palliative care discussed with family and decision has been made for one way extubation and comfort care on 06/28/18. She is now DNR> -Obtained EEG--global slowing and triphasics consistent with metabolic encephalopathy -continue empiric keppra -Continue carbidopa/levodopa through her OG-tube as recommended by neurology service.  Acute respiratory failure with hypoxia -Secondary to hypoventilation from encephalopathy; now due to HCAP and pulmonary edema -Currently on mechanical ventilation (intubated since 5/7) -5/12 personally reviewed CXR--increase interstitial markings increase RLL opacity; suggesting pneumonia development. -06/23/18--started enteral feeding -Plan is to proceed with compassionate extubation on Saturday, 06/28/2018.  HCAP/Aspiration pneumonitis -CXR as discussed above, just an aspiration pneumonitis/new infiltrates. -Continue cefepime/metronidazole -tracheal aspirate for culture -blood cultures x 2 without any growth. -check PCT -Patient currently afebrile.  UTI--EColi -appropriately treated with ceftriaxone for 5 days.  B12 deficiency -Repleting IM daily x 7 days, then once per week x 4 weeks  Parkinson's Disease -appreciate GI assistance-->OG placed to mid-esophagus-->trial of meds only with patient sitting at 45 degrees -continue carbidopa/levidopa  -appreciate neurology assistance.  Nontraumatic rhabdomyolysis -Initial CPK 1065>>>575>>>414 -continue Judicious IV fluids  Elevated  troponin -Secondary to demand ischemia -Trend is flat -Echocardiogram--60-65%, no PFO, grade 1 DD  FEN -status post OG tube placement; continue tube feeding.  -continue D51/2NS  Diabetes mellitus type 2 -Holding glipizide and Tradjenta currently -Continue NovoLog sliding scale and tube feedings coverage with NovoLog 2 units every 4 hours. -Hemoglobin A1c--6.2 -Continue to follow CBGs and  adjust hypoglycemic regimen as needed.  Hypothyroidism -ContinueIV Synthroid  Hyperlipidemia -Continue statins through OG tube   Patient has remained critically ill with multiple organ system failure requiring high complexity decision making for assessment and support, frequent evaluation and titration of therapies, application of advance monitoring technologies and extensive interpretation of multiple databases.  More than 50% of the time was dedicated to face-to-face examination, discussion with other specialists involved in her care and planning intervention along with nursing staff.  Time: 35 minutes.    Disposition Plan:Remain in ICU  Family Communication:Daughter updated on phone 5/13  Consultants:pulmonary, palliative care; neurology  Code Status: DNR  DVT Prophylaxis: [place] Lovenox   Procedures: As Listed in Progress Note Above  Antibiotics: Ceftriaxone 5/7>>5/11    Subjective: No major changes or improvement.  Currently afebrile.  Remains intubated and mechanically ventilated.  Objective: Vitals:   06/27/18 1129 06/27/18 1210 06/27/18 1644 06/27/18 1700  BP:    (!) 147/55  Pulse:      Resp:    (!) 28  Temp: 99.3 F (37.4 C)  99.6 F (37.6 C)   TempSrc: Axillary  Axillary   SpO2:  95%    Weight:      Height:        Intake/Output Summary (Last 24 hours) at 06/27/2018 1802 Last data filed at 06/27/2018 1756 Gross per 24 hour  Intake 2396.59 ml  Output 450 ml  Net 1946.59 ml   Weight change: 2.3 kg  Exam: General exam: Unable to  follow any commands, currently afebrile, remains intubated and mechanically ventilated.  No major events or distress reported overnight Respiratory system: Positive rhonchi bilaterally, no wheezing, no crackles. Cardiovascular system:RRR. No murmurs, rubs, gallops. Gastrointestinal system: Abdomen is nondistended, soft and nontender. No organomegaly or masses felt. Normal bowel sounds heard. Central nervous system: Unable to properly assess given current state; patient moving intermittently her legs but without any purpose intention. Extremities: No cyanosis or clubbing.  Positive upper extremity swelling appreciated bilaterally. Skin: No rashes, lesions or ulcers Psychiatry: Has remained essentially unresponsive.  Data Reviewed: I have personally reviewed following labs and imaging studies  Basic Metabolic Panel: Recent Labs  Lab 06/21/18 0451 06/22/18 0446 06/23/18 0506 06/24/18 0409 06/25/18 0519  NA 147* 145 139 135 135  K 3.8 3.4* 4.1 4.2 4.8  CL 116* 116* 112* 109 107  CO2 15* 21* 20* 19* 20*  GLUCOSE 135* 170* 187* 261* 291*  BUN 26* '18 15 15 20  '$ CREATININE 1.00 0.65 0.58 0.71 0.82  CALCIUM 8.8* 9.1 8.7* 8.2* 8.7*  MG  --  1.8 1.5*  --   --   PHOS  --  2.0* 2.4*  --   --    Liver Function Tests: Recent Labs  Lab 06/22/18 0446 06/24/18 0409  AST 25 25  ALT 5 9  ALKPHOS 57 59  BILITOT 0.7 0.4  PROT 5.4* 4.9*  ALBUMIN 2.5* 2.1*   CBC: Recent Labs  Lab 06/21/18 0451 06/22/18 0446 06/23/18 0506 06/24/18 0409 06/25/18 0519  WBC 8.5 6.6 7.4 6.9 13.5*  NEUTROABS  --  5.3 6.0  --   --   HGB 10.1* 9.7* 9.7* 9.0* 10.4*  HCT 33.4* 30.3* 31.7* 28.5* 31.3*  MCV 96.3 92.9 93.5 93.4 91.0  PLT 159 139* 140* 145* 162   Cardiac Enzymes: Recent Labs  Lab 06/21/18 0451 06/22/18 0446 06/24/18 0409  CKTOTAL 575* 433* 414*   BNP: Invalid input(s): POCBNP   CBG: Recent Labs  Lab 06/27/18 0003  06/27/18 0442 06/27/18 0757 06/27/18 1119 06/27/18 1626  GLUCAP  127* 118* 204* 195* 185*   HbA1C: No results for input(s): HGBA1C in the last 72 hours.   Urine analysis:    Component Value Date/Time   COLORURINE YELLOW 06/24/2018 0546   APPEARANCEUR CLOUDY (A) 06/24/2018 0546   LABSPEC 1.012 06/24/2018 0546   PHURINE 5.0 06/24/2018 0546   GLUCOSEU 150 (A) 06/24/2018 0546   HGBUR SMALL (A) 06/24/2018 0546   BILIRUBINUR NEGATIVE 06/24/2018 0546   KETONESUR NEGATIVE 06/24/2018 0546   PROTEINUR NEGATIVE 06/24/2018 0546   UROBILINOGEN 0.2 11/20/2012 1254   NITRITE NEGATIVE 06/24/2018 0546   LEUKOCYTESUR LARGE (A) 06/24/2018 0546    Recent Results (from the past 240 hour(s))  SARS Coronavirus 2 (CEPHEID - Performed in [place] hospital lab), Hosp Order     Status: None   Collection Time: 07/09/2018 12:15 PM  Result Value Ref Range Status   SARS Coronavirus 2 NEGATIVE NEGATIVE Final    Comment: (NOTE) If result is NEGATIVE SARS-CoV-2 target nucleic acids are NOT DETECTED. The SARS-CoV-2 RNA is generally detectable in upper and lower  respiratory specimens during the acute phase of infection. The lowest  concentration of SARS-CoV-2 viral copies this assay can detect is 250  copies / mL. A negative result does not preclude SARS-CoV-2 infection  and should not be used as the sole basis for treatment or other  patient management decisions.  A negative result may occur with  improper specimen collection / handling, submission of specimen other  than nasopharyngeal swab, presence of viral mutation(s) within the  areas targeted by this assay, and inadequate number of viral copies  (<250 copies / mL). A negative result must be combined with clinical  observations, patient history, and epidemiological information. If result is POSITIVE SARS-CoV-2 target nucleic acids are DETECTED. The SARS-CoV-2 RNA is generally detectable in upper and lower  respiratory specimens dur ing the acute phase of infection.  Positive  results are indicative of active  infection with SARS-CoV-2.  Clinical  correlation with patient history and other diagnostic information is  necessary to determine patient infection status.  Positive results do  not rule out bacterial infection or co-infection with other viruses. If result is PRESUMPTIVE POSTIVE SARS-CoV-2 nucleic acids MAY BE PRESENT.   A presumptive positive result was obtained on the submitted specimen  and confirmed on repeat testing.  While 2019 novel coronavirus  (SARS-CoV-2) nucleic acids may be present in the submitted sample  additional confirmatory testing may be necessary for epidemiological  and / or clinical management purposes  to differentiate between  SARS-CoV-2 and other Sarbecovirus currently known to infect humans.  If clinically indicated additional testing with an alternate test  methodology [phone]) is advised. The SARS-CoV-2 RNA is generally  detectable in upper and lower respiratory sp ecimens during the acute  phase of infection. The expected result is Negative. Fact Sheet for Patients:  StrictlyIdeas.no Fact Sheet for Healthcare Providers: BankingDealers.co.za This test is not yet approved or cleared by the [place] FDA and has been authorized for detection and/or diagnosis of SARS-CoV-2 by FDA under an Emergency Use Authorization (EUA).  This EUA will remain in effect (meaning this test can be used) for the duration of the COVID-19 declaration under Section 564(b)(1) of the Act, 21 U.S.C. section 360bbb-3(b)(1), unless the authorization is terminated or revoked sooner. Performed at [facility], [address] [place], [place] [address]   Urine culture     Status: Abnormal  Collection Time: 06/13/2018  2:38 PM  Result Value Ref Range Status   Specimen Description   Final    URINE, CLEAN CATCH Performed at [facility], [address] [place], [place] [address]    Special Requests   Final    NONE Performed at [facility], [address], [place], [place] [address]    Culture 90,000 COLONIES/mL ESCHERICHIA COLI (A)  Final   Report Status 06/21/2018 FINAL  Final   Organism ID, Bacteria ESCHERICHIA COLI (A)  Final      Susceptibility   Escherichia coli - MIC*    AMPICILLIN 4 SENSITIVE Sensitive     CEFAZOLIN <=4 SENSITIVE Sensitive     CEFTRIAXONE <=1 SENSITIVE Sensitive     CIPROFLOXACIN >=4 RESISTANT Resistant     GENTAMICIN <=1 SENSITIVE Sensitive     IMIPENEM <=0.25 SENSITIVE Sensitive     NITROFURANTOIN <=16 SENSITIVE Sensitive     TRIMETH/SULFA <=20 SENSITIVE Sensitive     AMPICILLIN/SULBACTAM <=2 SENSITIVE Sensitive     PIP/TAZO <=4 SENSITIVE Sensitive     Extended ESBL NEGATIVE Sensitive     * 90,000 COLONIES/mL ESCHERICHIA COLI  MRSA PCR Screening     Status: None   Collection Time: 06/23/2018  7:13 PM  Result Value Ref Range Status   MRSA by PCR NEGATIVE NEGATIVE Final    Comment:        The GeneXpert MRSA Assay (FDA approved for NASAL specimens only), is one component of a comprehensive MRSA colonization surveillance program. It is not intended to diagnose MRSA infection nor to guide or monitor treatment for MRSA infections. Performed at [facility], [address], [place], [place] [address]   Culture, blood (routine x 2)     Status: None   Collection Time: 07/12/2018  9:04 PM  Result Value Ref Range Status   Specimen Description BLOOD LEFT HAND  Final   Special Requests   Final    BOTTLES DRAWN AEROBIC AND ANAEROBIC Blood Culture adequate volume   Culture   Final    NO GROWTH 5 DAYS Performed at [facility], [address] [place], [place] [address]    Report Status 06/24/2018 FINAL  Final  Culture, blood (routine x 2)     Status: None   Collection Time: 07/13/2018  9:05 PM  Result Value Ref Range Status   Specimen Description BLOOD RIGHT HAND  Final   Special Requests   Final    BOTTLES DRAWN AEROBIC AND ANAEROBIC Blood Culture adequate volume   Culture   Final    NO  GROWTH 5 DAYS Performed at [facility], [address], [place], [place] [address]    Report Status 06/24/2018 FINAL  Final  Culture, Urine     Status: Abnormal   Collection Time: 06/24/18  5:46 AM  Result Value Ref Range Status   Specimen Description   Final    URINE, CATHETERIZED Performed at [facility], [address], [place], [place] [address]    Special Requests   Final    NONE Performed at [facility], [address], [place], [place] [address]    Culture >=100,000 COLONIES/mL YEAST (A)  Final   Report Status 06/26/2018 FINAL  Final     Scheduled Meds:  aspirin  300 mg Rectal Daily   carbidopa-levodopa  2 tablet Oral TID   chlorhexidine gluconate (MEDLINE KIT)  15 mL Mouth Rinse BID   Chlorhexidine Gluconate Cloth  6 each Topical Q0600   feeding supplement (OSMOLITE 1.5  CAL)  1,000 mL Per Tube Q24H   glycopyrrolate  0.2 mg Intravenous QID   insulin aspart  0-9 Units Subcutaneous Q4H   insulin aspart  2 Units Subcutaneous Q4H   levothyroxine  50 mcg Intravenous Daily   mouth rinse  15 mL Mouth Rinse 10 times per day   rotigotine  1 patch Transdermal Daily   Continuous Infusions:  ceFEPime (MAXIPIME) IV Stopped (06/27/18 1311)   dextrose 5 % and 0.45 % NaCl with KCl 20 mEq/L 75 mL/hr at 06/27/18 1756   famotidine (PEPCID) IV Stopped (06/27/18 0944)   levETIRAcetam Stopped (06/27/18 0930)   metronidazole Stopped (06/27/18 1755)    Procedures/Studies: Dg Abd 1 View  Result Date: 06/21/2018 CLINICAL DATA:  Check gastric catheter placement EXAM: ABDOMEN - 1 VIEW COMPARISON:  None. FINDINGS: Scattered large and small bowel gas is noted. Degenerative changes of lumbar spine are noted with scoliosis concave to the right. Check gastric catheter is noted in the mid esophagus at the superior aspect of the film. This could be advanced several cm to reach the stomach. IMPRESSION: Gastric catheter in the mid esophagus. Electronically Signed   By: Inez Catalina M.D.    On: 06/21/2018 15:47   Ct Head Wo Contrast  Result Date: 06/20/2018 CLINICAL DATA:  85 year old with altered mental status. EXAM: CT HEAD WITHOUT CONTRAST TECHNIQUE: Contiguous axial images were obtained from the base of the skull through the vertex without intravenous contrast. COMPARISON:  06/18/2018 FINDINGS: Brain: Stable cerebral atrophy. Negative for acute hemorrhage, mass lesion, midline shift, hydrocephalus or large infarct. Again noted are hypodensities in the white matter suggesting chronic changes. Focal low-density in the right frontal-parietal white matter is compatible with old ischemic changes. Patient's head rotated on this examination. Vascular: No hyperdense vessel or unexpected calcification. Skull: Normal. Negative for fracture or focal lesion. Sinuses/Orbits: No acute finding. Other: None. IMPRESSION: 1. No acute intracranial abnormality. 2. Stable atrophy and chronic white matter changes. Findings are most compatible with chronic small vessel ischemic changes. Electronically Signed   By: Markus Daft M.D.   On: 06/20/2018 15:55   Ct Head Wo Contrast  Result Date: 07/04/2018 CLINICAL DATA:  85 year old female with a history of altered mental status EXAM: CT HEAD WITHOUT CONTRAST TECHNIQUE: Contiguous axial images were obtained from the base of the skull through the vertex without intravenous contrast. COMPARISON:  12/15/2012 FINDINGS: Brain: No acute intracranial hemorrhage. No midline shift or mass effect. Gray-white differentiation maintained. Progression of senescent volume loss. Patchy hypodensities in the periventricular white matter. Unremarkable appearance of the ventricular system. Vascular: Dense calcifications of anterior and posterior circulation. Skull: No acute fracture.  No aggressive bone lesion identified. Sinuses/Orbits: Unremarkable appearance of the orbits. Mastoid air cells clear. No middle ear effusion. No significant sinus disease. Other: Endotracheal tube in position.   Fluid in the nasopharynx IMPRESSION: Negative for acute intracranial abnormality. Senescent volume loss and evidence of chronic microvascular ischemic disease. Electronically Signed   By: Corrie Mckusick D.O.   On: 06/14/2018 14:11   Dg Chest Port 1 View  Result Date: 06/26/2018 CLINICAL DATA:  85 year old female with respiratory failure in cephalopathy, recently negative for COVID-19. EXAM: PORTABLE CHEST 1 VIEW COMPARISON:  06/25/2018 and earlier. FINDINGS: Portable AP upright view at 0540 hours. Stable lines and tubes. Stable lung volumes. The patient remains mildly rotated to the right. No pneumothorax, pulmonary edema, pleural effusion or consolidation. Mild infrahilar atelectasis suspected. Paucity bowel gas in the upper abdomen. IMPRESSION: 1. Stable lines and tubes.  2. Continued mild atelectasis. Electronically Signed   By: Genevie Ann M.D.   On: 06/26/2018 08:32   Dg Chest Port 1 View  Result Date: 06/25/2018 CLINICAL DATA:  85 year old female with encephalopathy, intubated. Negative for COVID-19 recently. EXAM: PORTABLE CHEST 1 VIEW COMPARISON:  06/24/2018 and earlier. FINDINGS: Portable AP upright views at 0554 hours. Stable endotracheal tube tip at the level the clavicles. Enteric tube courses to the abdomen, tip not included. Stable right PICC line. Mildly improved lung volumes. Stable cardiac size and mediastinal contours. No pneumothorax. Stable pulmonary vascularity without overt edema. No pleural effusion. Mild patchy lung base opacity most resembles atelectasis. Paucity of bowel gas in the upper abdomen. No acute osseous abnormality identified. IMPRESSION: 1. Stable lines and tubes. 2. Improved lung volumes with mild lung base atelectasis. Electronically Signed   By: Genevie Ann M.D.   On: 06/25/2018 08:02   Dg Chest Port 1 View  Result Date: 06/24/2018 CLINICAL DATA:  Followup ventilator support EXAM: PORTABLE CHEST 1 VIEW COMPARISON:  06/22/2018 FINDINGS: Endotracheal tube tip is 3 cm above the  carina. Orogastric or nasogastric tube extends at least to the distal esophagus. Right-sided central line tip at the SVC RA junction. The patient is rotated towards the right. The left lung appears clear. There appears to be volume loss and infiltrate in the right lung. IMPRESSION: Patient rotated towards the right. Possible worsening of infiltrate in volume loss in the right lung. Orogastric or nasogastric tube tip not clearly seen due to underpenetration. Electronically Signed   By: Nelson Chimes M.D.   On: 06/24/2018 10:40   Dg Chest Port 1 View  Result Date: 06/22/2018 CLINICAL DATA:  Respiratory failure. EXAM: PORTABLE CHEST 1 VIEW COMPARISON:  06/21/2018 FINDINGS: The patient is rotated to the left. A right PICC has been placed and terminates over the lower SVC. An endotracheal tube terminates just below the clavicles, unchanged. An enteric tube is suboptimally visualized though now appears to extend into the upper abdomen. The cardiomediastinal silhouette is unchanged. Lung volumes remain low without evidence of airspace consolidation, edema, pleural effusion, pneumothorax. IMPRESSION: 1. Support devices including new right PICC as above. 2. Low lung volumes without evidence of acute airspace disease. Electronically Signed   By: Logan Bores M.D.   On: 06/22/2018 08:16   Dg Chest Port 1 View  Result Date: 06/21/2018 CLINICAL DATA:  Respiratory failure. EXAM: PORTABLE CHEST 1 VIEW COMPARISON:  Radiograph of Jun 20, 2018. FINDINGS: The heart size and mediastinal contours are within normal limits. Endotracheal tube is unchanged in position. Nasogastric tube appears to be looped within the distal esophagus with tip directed back up into the more proximal esophagus. No acute pulmonary disease is noted. No pneumothorax or pleural effusion is noted. Atherosclerosis of thoracic aorta is noted. The visualized skeletal structures are unremarkable. IMPRESSION: Endotracheal tube is unchanged in position. Nasogastric  tube is looped within distal esophagus, with distal tip directed into the more proximal esophagus. No acute cardiopulmonary abnormality seen. Aortic Atherosclerosis (ICD10-I70.0). Electronically Signed   By: Marijo Conception M.D.   On: 06/21/2018 08:48   Dg Chest Port 1 View  Result Date: 06/20/2018 CLINICAL DATA:  85 year old female with repositioning of the endotracheal tube EXAM: PORTABLE CHEST 1 VIEW COMPARISON:  06/20/2018 FINDINGS: Cardiomediastinal silhouette unchanged in size and contour. No evidence of central vascular congestion. The endotracheal tube has been slightly withdrawn, now terminating 2.7 cm above the carina. There is a linear contour of the right lung of uncertain  significance. There are overlying surgical lines tubes in EKG leads of the right chest somewhat obscuring evaluation. Low lung volumes. Pleuroparenchymal opacity of the right lung, may represent chronic changes and some pleural fluid. No new airspace disease. IMPRESSION: Endotracheal tube has been slightly withdrawn now terminating 2.7 cm above the carina. There is linear contour of the right lung extending in a convex configuration towards the costophrenic angle. While this is favored to represent artifact, a right pneumothorax cannot be excluded. Correlation with physical exam may be useful, as well as a repeat chest x-ray with adequate positioning and removal of the obscuring surgical line/hardware and EKG leads. These results were discussed by telephone at the time of interpretation on 06/20/2018 at 9:21 am with the nurse caring for the patient, Ms Sherry Ruffing. Electronically Signed   By: Corrie Mckusick D.O.   On: 06/20/2018 09:21   Dg Chest Port 1 View  Result Date: 06/20/2018 CLINICAL DATA:  Unresponsive. EXAM: PORTABLE CHEST 1 VIEW COMPARISON:  Radiograph Jun 19, 2018. FINDINGS: The heart size and mediastinal contours are within normal limits. Atherosclerosis of thoracic aorta is noted. Endotracheal tube is directed into  right mainstem bronchus; withdrawal by 3-4 cm is recommended. Both lungs are clear. The visualized skeletal structures are unremarkable. IMPRESSION: Endotracheal tube directed into right mainstem bronchus; withdrawal by 3-4 cm is recommended. No acute cardiopulmonary abnormality seen. These results will be called to the ordering clinician or representative by the Radiologist Assistant, and communication documented in the PACS or zVision Dashboard. Electronically Signed   By: Marijo Conception M.D.   On: 06/20/2018 08:16   Dg Chest Portable 1 View  Result Date: 06/20/2018 CLINICAL DATA:  Unresponsive. EXAM: PORTABLE CHEST 1 VIEW COMPARISON:  06/27/2010 FINDINGS: 1301 hours. Endotracheal tube tip is approximately 1.4 cm above the base of the carina. The lungs are clear without focal pneumonia, edema, pneumothorax or pleural effusion. The cardiopericardial silhouette is within normal limits for size. The visualized bony structures of the thorax are intact. Telemetry leads overlie the chest. IMPRESSION: No active disease. Electronically Signed   By: Misty Stanley M.D.   On: 06/13/2018 13:23   Dg Chest Port 1v Same Day  Result Date: 06/20/2018 CLINICAL DATA:  Intubated patient. Possible pneumothorax on plain film of the chest earlier today. EXAM: PORTABLE CHEST 1 VIEW COMPARISON:  Single-view of the chest earlier today. FINDINGS: Endotracheal tube is in place with the tip well above the carina. There is no pneumothorax. Finding described on the prior report was likely a skin fold. Lungs clear. Heart size normal. IMPRESSION: Negative for pneumothorax.  No acute disease. ETT in good position. Electronically Signed   By: Inge Rise M.D.   On: 06/20/2018 10:16   Dg Addison Bailey G Tube Plc W/fl W/rad  Result Date: 06/23/2018 CLINICAL DATA:  Difficulty advancing OG tube into the stomach EXAM: NASO G TUBE PLACEMENT WITH FL AND WITH RAD CONTRAST:  20 mL Omnipaque 300 FLUOROSCOPY TIME:  Fluoroscopy Time:  5 seconds  Radiation Exposure Index (if provided by the fluoroscopic device): Number of Acquired Spot Images: 0 COMPARISON:  Abdomen film Jun 21, 2018 FINDINGS: Fluoroscopy over the lower chest and upper abdomen demonstrates the tube to have advanced since prior KUB with the tip in the proximal stomach. This was advanced slightly further with the tip now in the mid to distal stomach. Contrast was injected to confirm placement. IMPRESSION: OG tube advanced into the mid to distal stomach. Electronically Signed   By: Rolm Baptise  M.D.   On: 06/23/2018 15:49    Barton Dubois, MD  [facility] Pager [phone]  06/27/2018, 6:02 PM   LOS: 8 days

## 2018-06-27 NOTE — Progress Notes (Signed)
Pharmacy Antibiotic Note  Jennifer [name] is a 85 y.o. female admitted on 2018/06/26 with pneumonia/HCAP.  Pharmacy has been consulted for Cefepime dosing.  Plan: Cefepime 2gm IV q12h Flagyl 500 mg IV every 8 hours F/Ucxs and clinical progress Monitor V/S, labs  Height: 5\' 3"  (160 cm) Weight: 153 lb 14.1 oz (69.8 kg) IBW/kg (Calculated) : 52.4  Temp (24hrs), Avg:99.2 F (37.3 C), Min:98.2 F (36.8 C), Max:100.3 F (37.9 C)  Recent Labs  Lab 06/21/18 0451 06/22/18 0446 06/23/18 0506 06/24/18 0409 06/25/18 0519  WBC 8.5 6.6 7.4 6.9 13.5*  CREATININE 1.00 0.65 0.58 0.71 0.82    Estimated Creatinine Clearance: 46.2 mL/min (by C-G formula based on SCr of 0.82 mg/dL).    Allergies  Allergen Reactions  . Ace Inhibitors Other (See Comments)    Hyperkalemia-mild   . Codeine Nausea And Vomiting  . Penicillins Swelling  . Ropinirole Hcl Other (See Comments)    Pt wasn't in right state of mind    Antimicrobials this admission: Cefepime 5/12>>  Ceftriaxone 5/7>> 5/12 Flagyl 5/12 >>  Dose adjustments this admission: n/a  Microbiology results: 5/7 BCX: ngtd 5/7 UCx: 90K CFU/ml s- ceftriaxone R-cipro 5/12 UCx: yeast 5/7 MRSA PCR: negative  Thank you for allowing pharmacy to be a part of this patient's care.  Judeth Cornfield, PharmD Clinical Pharmacist 06/27/2018 9:11 AM

## 2018-06-27 NOTE — Progress Notes (Signed)
[place] A. Merlene Laughter, MD     [email or web address]          Jennifer [name] is an 85 y.o. female.   Assessment/Plan: 1.  Acute encephalopathy: Multifactorial but NO meaningful improvement with treatments -it appears that comfort measures has been initiated which is appropriate given the poor response to treatment.  We will sign off.  2.  Parkinson disease:    No improvement in the patient's response.  GENERAL: This is a thin lady who is intubated.    HEENT: Neck is supple no trauma appreciated  EXTREMITIES: No edema   BACK: Normal  SKIN: Normal by inspection.    MENTAL STATUS: There is partial eye opening.  There is no verbal output or attempt to follow commands.  CRANIAL NERVES: Eyes are deviated to the right but she does have full extraocular movements spontaneously sometimes roving.  Corneal reflexes are severely diminished.  MOTOR: There is mild to moderate flexion withdrawal to deep painful stimuli bilaterally.    COORDINATION: No tremor is noted.  There is mild rigidity and cogwheeling bilaterally.  SENSATION: Normal to pain.       Objective: Vital signs in last 24 hours: Temp:  [98.7 F (37.1 C)-100.3 F (37.9 C)] 99.6 F (37.6 C) (05/15 1644) Resp:  [21-30] 28 (05/15 1700) BP: (110-147)/(35-120) 147/55 (05/15 1700) SpO2:  [93 %-98 %] 95 % (05/15 1210) FiO2 (%):  [40 %] 40 % (05/15 1210) Weight:  [69.8 kg] 69.8 kg (05/15 0500)  Intake/Output from previous day: 05/14 0701 - 05/15 0700 In: 1726.2 [I.V.:587.2; NG/GT:100; IV Piggyback:1039] Out: -  Intake/Output this shift: Total I/O In: 1546.5 [I.V.:695.4; NG/GT:300; IV Piggyback:551.1] Out: 950 [Urine:950] Nutritional status:  Diet Order    None       Lab Results: Results for orders placed or performed during the hospital encounter of 06/27/2018 (from the past 48 hour(s))  Glucose, capillary     Status: Abnormal   Collection Time: 06/25/18  8:40 PM  Result Value  Ref Range   Glucose-Capillary 201 (H) 70 - 99 mg/dL   Comment 1 Notify RN    Comment 2 Document in Chart   Glucose, capillary     Status: Abnormal   Collection Time: 06/26/18 12:39 AM  Result Value Ref Range   Glucose-Capillary 149 (H) 70 - 99 mg/dL   Comment 1 Notify RN    Comment 2 Document in Chart   Blood gas, arterial     Status: Abnormal   Collection Time: 06/26/18  4:34 AM  Result Value Ref Range   FIO2 40.00    Delivery systems VENTILATOR    Mode PRESSURE REGULATED VOLUME CONTROL    VT 410 mL   LHR 16 resp/min   Peep/cpap 5.0 cm H20   pH, Arterial 7.452 (H) 7.350 - 7.450   pCO2 arterial 34.0 32.0 - 48.0 mmHg   pO2, Arterial 113 (H) 83.0 - 108.0 mmHg   Bicarbonate 24.6 20.0 - 28.0 mmol/L   Acid-base deficit 0.1 0.0 - 2.0 mmol/L   O2 Saturation 98.1 %   Patient temperature 37.5    Collection site RIGHT RADIAL    Drawn by 15056    Allens test (pass/fail) PASS PASS    Comment: Performed at [facility], [address], [place], [place] [address]  Procalcitonin     Status: None   Collection Time: 06/26/18  4:37 AM  Result Value Ref Range   Procalcitonin 0.83 ng/mL    Comment:  Interpretation: PCT > 0.5 ng/mL and <= 2 ng/mL: Systemic infection (sepsis) is possible, but other conditions are known to elevate PCT as well. (NOTE)       Sepsis PCT Algorithm           Lower Respiratory Tract                                      Infection PCT Algorithm    ----------------------------     ----------------------------         PCT < 0.25 ng/mL                PCT < 0.10 ng/mL         Strongly encourage             Strongly discourage   discontinuation of antibiotics    initiation of antibiotics    ----------------------------     -----------------------------       PCT 0.25 - 0.50 ng/mL            PCT 0.10 - 0.25 ng/mL               OR       >80% decrease in PCT            Discourage initiation of                                            antibiotics      Encourage  discontinuation           of antibiotics    ----------------------------     -----------------------------         PCT >= 0.50 ng/mL              PCT 0.26 - 0.50 ng/mL                AND       <80% decrease in PCT             Encourage initiation of                                             antibiotics       Encourage continuation           of antibiotics    ----------------------------     -----------------------------        PCT >= 0.50 ng/mL                  PCT > 0.50 ng/mL               AND         increase in PCT                  Strongly encourage                                      initiation of antibiotics    Strongly encourage escalation           of antibiotics                                     -----------------------------  PCT <= 0.25 ng/mL                                                 OR                                        > 80% decrease in PCT                                     Discontinue / Do not initiate                                             antibiotics Performed at [facility], [address] [place], [place] [address]   Glucose, capillary     Status: Abnormal   Collection Time: 06/26/18  4:55 AM  Result Value Ref Range   Glucose-Capillary 240 (H) 70 - 99 mg/dL   Comment 1 Notify RN    Comment 2 Document in Chart   Glucose, capillary     Status: Abnormal   Collection Time: 06/26/18  7:38 AM  Result Value Ref Range   Glucose-Capillary 157 (H) 70 - 99 mg/dL  Glucose, capillary     Status: Abnormal   Collection Time: 06/26/18 11:40 AM  Result Value Ref Range   Glucose-Capillary 125 (H) 70 - 99 mg/dL  Glucose, capillary     Status: Abnormal   Collection Time: 06/26/18  4:47 PM  Result Value Ref Range   Glucose-Capillary 165 (H) 70 - 99 mg/dL   Comment 1 Notify RN    Comment 2 Document in Chart   Glucose, capillary     Status: Abnormal   Collection Time: 06/26/18  8:20 PM  Result Value Ref Range    Glucose-Capillary 172 (H) 70 - 99 mg/dL  Glucose, capillary     Status: Abnormal   Collection Time: 06/27/18 12:03 AM  Result Value Ref Range   Glucose-Capillary 127 (H) 70 - 99 mg/dL   Comment 1 Notify RN    Comment 2 Document in Chart   Glucose, capillary     Status: Abnormal   Collection Time: 06/27/18  4:42 AM  Result Value Ref Range   Glucose-Capillary 118 (H) 70 - 99 mg/dL   Comment 1 Notify RN    Comment 2 Document in Chart   Blood gas, arterial     Status: Abnormal   Collection Time: 06/27/18  5:00 AM  Result Value Ref Range   FIO2 40.00    pH, Arterial 7.471 (H) 7.350 - 7.450   pCO2 arterial 32.9 32.0 - 48.0 mmHg   pO2, Arterial 133 (H) 83.0 - 108.0 mmHg   Bicarbonate 25.1 20.0 - 28.0 mmol/L   Acid-Base Excess 0.4 0.0 - 2.0 mmol/L   O2 Saturation 98.7 %   Patient temperature 37.0    Allens test (pass/fail) PASS PASS    Comment: Performed at [facility], [address] [place], [place] [address]  Glucose, capillary     Status: Abnormal   Collection Time: 06/27/18  7:57 AM  Result Value Ref Range  Glucose-Capillary 204 (H) 70 - 99 mg/dL  Glucose, capillary     Status: Abnormal   Collection Time: 06/27/18 11:19 AM  Result Value Ref Range   Glucose-Capillary 195 (H) 70 - 99 mg/dL  Glucose, capillary     Status: Abnormal   Collection Time: 06/27/18  4:26 PM  Result Value Ref Range   Glucose-Capillary 185 (H) 70 - 99 mg/dL    Lipid Panel No results for input(s): CHOL, TRIG, HDL, CHOLHDL, VLDL, LDLCALC in the last 72 hours.  Studies/Results:  REPEAT HEAD CT  FINDINGS: Brain: Stable cerebral atrophy. Negative for acute hemorrhage, mass lesion, midline shift, hydrocephalus or large infarct. Again noted are hypodensities in the white matter suggesting chronic changes. Focal low-density in the right frontal-parietal white matter is compatible with old ischemic changes. Patient's head rotated on this examination.  Vascular: No hyperdense vessel or unexpected  calcification.  Skull: Normal. Negative for fracture or focal lesion.  Sinuses/Orbits: No acute finding.  Other: None.  IMPRESSION: 1. No acute intracranial abnormality. 2. Stable atrophy and chronic white matter changes. Findings are most compatible with chronic small vessel ischemic changes.     Medications:  Scheduled Meds: . aspirin  300 mg Rectal Daily  . carbidopa-levodopa  2 tablet Oral TID  . chlorhexidine gluconate (MEDLINE KIT)  15 mL Mouth Rinse BID  . Chlorhexidine Gluconate Cloth  6 each Topical Q0600  . feeding supplement (OSMOLITE 1.5 CAL)  1,000 mL Per Tube Q24H  . glycopyrrolate  0.2 mg Intravenous QID  . insulin aspart  0-9 Units Subcutaneous Q4H  . insulin aspart  2 Units Subcutaneous Q4H  . levothyroxine  50 mcg Intravenous Daily  . mouth rinse  15 mL Mouth Rinse 10 times per day  . rotigotine  1 patch Transdermal Daily   Continuous Infusions: . ceFEPime (MAXIPIME) IV Stopped (06/27/18 1311)  . dextrose 5 % and 0.45 % NaCl with KCl 20 mEq/L 75 mL/hr at 06/27/18 1756  . famotidine (PEPCID) IV Stopped (06/27/18 0944)  . levETIRAcetam Stopped (06/27/18 0930)  . metronidazole Stopped (06/27/18 1755)   PRN Meds:.acetaminophen, albuterol, fentaNYL (SUBLIMAZE) injection, fentaNYL (SUBLIMAZE) injection, ondansetron (ZOFRAN) IV     LOS: 8 days   Cary Wilford A. Merlene Laughter, M.D.  Diplomate, Tax adviser of Psychiatry and Neurology ( Neurology).

## 2018-06-27 NOTE — Care Management Important Message (Signed)
Important Message  Patient Details  Name: Jennifer [name] MRN: [id number] Date of Birth: 1930-04-18   Medicare Important Message Given:  Yes    Corey Harold 06/27/2018, 1:18 PM

## 2018-06-28 LAB — GLUCOSE, CAPILLARY: Glucose-Capillary: 203 mg/dL — ABNORMAL HIGH (ref 70–99)

## 2018-06-28 MED ORDER — MORPHINE SULFATE (PF) 2 MG/ML IV SOLN: 2.0000 mg | INTRAVENOUS | Status: DC | PRN

## 2018-06-28 MED ORDER — DEXTROSE 5 % IV SOLN: INTRAVENOUS | Status: DC

## 2018-06-28 MED ORDER — GLYCOPYRROLATE 0.2 MG/ML IJ SOLN: 0.2000 mg | INTRAMUSCULAR | Status: DC | PRN

## 2018-06-28 MED ORDER — ACETAMINOPHEN 650 MG RE SUPP: 650.0000 mg | Freq: Four times a day (QID) | RECTAL | Status: DC | PRN

## 2018-06-28 MED ORDER — MORPHINE 100MG IN NS 100ML (1MG/ML) PREMIX INFUSION
0.0000 mg/h | INTRAVENOUS | Status: DC
  Administered 2018-06-28: 20 mg/h via INTRAVENOUS
  Administered 2018-06-28: 5 mg/h via INTRAVENOUS
  Administered 2018-06-29: 4 mg/h via INTRAVENOUS
  Administered 2018-06-30 (×2): 20 mg/h via INTRAVENOUS
  Filled 2018-06-28 (×4): qty 100

## 2018-06-28 MED ORDER — MORPHINE BOLUS VIA INFUSION
5.0000 mg | INTRAVENOUS | Status: DC | PRN
  Filled 2018-06-28: qty 5

## 2018-06-28 MED ORDER — POLYVINYL ALCOHOL 1.4 % OP SOLN: 1.0000 [drp] | Freq: Four times a day (QID) | OPHTHALMIC | Status: DC | PRN

## 2018-06-28 MED ORDER — ACETAMINOPHEN 325 MG PO TABS: 650.0000 mg | ORAL_TABLET | Freq: Four times a day (QID) | ORAL | Status: DC | PRN

## 2018-06-28 MED ORDER — MIDAZOLAM HCL 2 MG/2ML IJ SOLN: 2.0000 mg | INTRAMUSCULAR | Status: DC | PRN

## 2018-06-28 MED ORDER — GLYCOPYRROLATE 1 MG PO TABS: 1.0000 mg | ORAL_TABLET | ORAL | Status: DC | PRN

## 2018-06-28 MED ORDER — DIPHENHYDRAMINE HCL 50 MG/ML IJ SOLN: 25.0000 mg | INTRAMUSCULAR | Status: DC | PRN

## 2018-06-28 NOTE — Progress Notes (Signed)
Visited with Patient and 6 members of Bryce family (daughter, son-in-law, grand children and great grand children). Jennifer [name] was not able to communicate during visit. Assessment referred to family who asked for prayer of peace for Jennifer [name] and strength for family. Daughter expressed that it was difficult because she lost her family 6 months ago. Offered words comfort and prayed along with family.

## 2018-06-28 NOTE — Procedures (Signed)
Extubation Procedure Note Patient's mouth and ETT suctioned pre-extubation Patient Details:   Name: Jennifer [name] DOB: 1930-05-21 MRN: [id number]   Airway Documentation:    Vent end date: 06/28/2018 Vent end time: 1020  Post extubation patient placed on 4L [place] .  Evaluation  O2 sats: patient terminal extubation  Complications: No apparent complications Patient did not tolerate procedure well. Bilateral Breath Sounds: (faint scattered rhonchi noted)   No  Lina Sayre 06/28/2018, 10:51 AM

## 2018-06-28 NOTE — Progress Notes (Signed)
Plan is for compassionate extubation later today.  I will go ahead and write orders.  I will plan to sign off.  Thanks for allowing me to see her with you

## 2018-06-28 NOTE — Progress Notes (Signed)
PROGRESS NOTE  Jennifer [name] XYV:[id number] DOB: 1930-09-05 DOA: 07/07/2018 PCP: [name], MD  Brief History: 85 year old female with a history of TIA, Parkinson's disease, hypothyroidism, hypertension, diabetes mellitus, hyperlipidemia, PSVT status post RFA 2006, and tobacco abuse in remission presented with unresponsiveness. The patient lives by herself, but her granddaughter intermittently checks up on her. On 06/18/2018, the patient granddaughter noted the patient to be lethargic, but felt that the patient was just tired and sleepy. She went back to check up on the patient on the morning of 07/13/2018 and noted the patient to be essentially unresponsive. EMS was activated. In the emergency department, the patient was intubated to protect her airway secondary to her encephalopathy. According to the patient's daughter, the patient has had a long history of poor compliance with her medications. It was felt in the past that her encephalopathy may have been partly attributed to not taking her Parkinson's medicines as well as he thyroid replacement. However, the patient has never been unresponsive. There is been no history of recent fevers, chills, complaints of chest pain, shortness breath, vomiting, diarrhea. At baseline, the patient has some pleasant confusion but is able to converse. She normally gets around in a wheelchair, but occasionally uses a walker. In the emergency department, the patient had temperature 100.4 F, but blood pressure as high as 208/73. EKG showed sinus tachycardia without any ST-T wave changes. CPK was 1065. UA showed 21-50 WBC. Otherwise BMP and CBC were essentially unremarkable. CT of the brain was negative for any acute findings. Chest x-ray was negative for any infiltrates. Neurology and pulmonarywereconsulted to assist with management.  Assessment/Plan: Acute encephalopathy, type unspecified -Etiology unclear although likely multifactorial  including UTI, B12 deficiency, possible seizure, levodopa withdraw -Appreciate neurology consult -06/18/2018 CT brain negative for acute findings -MRI of brain--cannot obtain while on ventilator -06/20/18 CT brain--neg for acute findings -Folic KMMN--8.1 -RRNHAFB--90 -TSH 0.462 -Urine drug screen negative -Personally reviewed EKG--sinus rhythm, no ST-T wave changes -06/23/2018--patient remainsminimally responsivetoprotopathic stimuli despite being off sedation. -palliative care discussed with family and decision has been made for one way extubation and comfort care on 06/28/18. She is now DNR> -Obtained EEG--global slowing and triphasics consistent with metabolic encephalopathy -continue empiric keppra -Continue carbidopa/levodopa through her OG-tube as recommended by neurology service. -Transition to comfort care after compassionate extubation later today.  Acute respiratory failure with hypoxia -Secondary to hypoventilation from encephalopathy; now due to HCAP and pulmonary edema -Currently on mechanical ventilation (intubated since 5/7) -5/12 personally reviewed CXR--increase interstitial markings increase RLL opacity; suggesting pneumonia development. -06/23/18--started enteral feeding -Plan is to proceed with compassionate extubation later today (06/28/2018); appreciate pulmonology's assistance and recommendation.Marland Kitchen  HCAP/Aspiration pneumonitis -CXR as discussed above, just an aspiration pneumonitis/new infiltrates. -Continue cefepime/metronidazole -tracheal aspirate for culture -blood cultures x 2 without any growth. -check PCT -Patient currently afebrile.  UTI--EColi -appropriately treated with ceftriaxone for 5 days.  B12 deficiency -Repleted IM X 7 days -will now focus on comfort care.  Parkinson's Disease -appreciate GI assistance-->OG placed to mid-esophagus-->trial of meds only with patient sitting at 45 degrees -continue carbidopa/levidopa  -appreciate neurology  assistance. -Stop all medications that can now be given orally after compassionate extubation.  Nontraumatic rhabdomyolysis -Initial CPK 1065>>>575>>>414 -We will discontinue IV fluids.  Elevated troponin -Secondary to demand ischemia -Trend is flat -Echocardiogram--60-65%, no PFO, grade 1 DD  FEN -status post OG tube placement; continue tube feeding for now.  -After compassionate extubation will discontinue IV fluids  and tube feedings.  Diabetes mellitus type 2 -continue Holding glipizide and Tradjenta currently -Continue NovoLog sliding scale and tube feedings coverage with NovoLog 2 units every 4 hours. -Hemoglobin A1c--6.2 -Continue to follow CBGs and adjust hypoglycemic regimen as needed. -Pre-discontinuation of CBGs and insulin after OG tube and tube feedings discontinued.  Hypothyroidism -ContinueIV Synthroid for now.  Hyperlipidemia -Continue statins through OG tube for now. -but will plan on stopping all meds not intended to for comfort in the presence of inability to safely take PO's.  Patient remains critically ill, intubated and mechanically ventilated.  At this moment without any improvement in mentation or demonstration of proper's improvement in her ability to protect airways.  Following plan discussion with palliative care and family members will pursuit compassionate extubation around 10 AM in the morning and focus on full comfort. .  Time: 30 minutes    Disposition Plan:Remain in ICU  Family Communication:Daughter updated on phone 5/13  Consultants:pulmonary, palliative care; neurology  Code Status: DNR  DVT Prophylaxis: Irwin Lovenox   Procedures: As Listed in Progress Note Above  Antibiotics: Ceftriaxone 5/7>>5/11  Subjective: Afebrile, has remained unresponsive.  No overnight events.  Still intubated and mechanically ventilated.  Objective: Vitals:   06/28/18 1100 06/28/18 1133 06/28/18 1246 06/28/18 1325  BP:        Pulse:      Resp: (!) 29 (!) _0 Temp:      TempSrc:      SpO2:      Weight:      Height:        Intake/Output Summary (Last 24 hours) at 06/28/2018 1515 Last data filed at 06/28/2018 1048 Gross per 24 hour  Intake 2807.33 ml  Output 1050 ml  Net 1757.33 ml   Weight change: 1.3 kg  Exam: General exam: Afebrile, no overnight events, still remain unable to follow any commands or demonstrate age-appropriate improvement in mentation.  Intubated and mechanically ventilated. Respiratory system: No wheezing, no crackles, positive scattered rhonchi. Cardiovascular system:RRR. No murmurs, rubs, gallops. Gastrointestinal system: Abdomen is nondistended, soft and nontender. No organomegaly or masses felt. Normal bowel sounds heard. Central nervous system: Not following commands. Extremities: No cyanosis or clubbing.  Positive upper extremity swelling bilaterally (1+ edema). Psychiatry: Has remained unresponsive.  Data Reviewed: I have personally reviewed following labs and imaging studies  Basic Metabolic Panel: Recent Labs  Lab 06/22/18 0446 06/23/18 0506 06/24/18 0409 06/25/18 0519  NA 145 139 135 135  K 3.4* 4.1 4.2 4.8  CL 116* 112* 109 107  CO2 21* 20* 19* 20*  GLUCOSE 170* 187* 261* 291*  BUN _1 CREATININE 0.65 0.58 0.71 0.82  CALCIUM 9.1 8.7* 8.2* 8.7*  MG 1.8 1.5*  --   --   PHOS 2.0* 2.4*  --   --    Liver Function Tests: Recent Labs  Lab 06/22/18 0446 06/24/18 0409  AST 25 25  ALT 5 9  ALKPHOS 57 59  BILITOT 0.7 0.4  PROT 5.4* 4.9*  ALBUMIN 2.5* 2.1*   CBC: Recent Labs  Lab 06/22/18 0446 06/23/18 0506 06/24/18 0409 06/25/18 0519  WBC 6.6 7.4 6.9 13.5*  NEUTROABS 5.3 6.0  --   --   HGB 9.7* 9.7* 9.0* 10.4*  HCT 30.3* 31.7* 28.5* 31.3*  MCV 92.9 93.5 93.4 91.0  PLT 139* 140* 145* 162   Cardiac Enzymes: Recent Labs  Lab 06/22/18 0446 06/24/18 0409  CKTOTAL 433* 414*   CBG: Recent Labs  Lab 06/27/18 1119 06/27/18 1626  06/27/18 1954 06/27/18 2352 06/28/18 0411  GLUCAP 195* 185* 166* 202* 203*   Urine analysis:    Component Value Date/Time   COLORURINE YELLOW 06/24/2018 0546   APPEARANCEUR CLOUDY (A) 06/24/2018 0546   LABSPEC 1.012 06/24/2018 0546   PHURINE 5.0 06/24/2018 0546   GLUCOSEU 150 (A) 06/24/2018 0546   HGBUR SMALL (A) 06/24/2018 0546   BILIRUBINUR NEGATIVE 06/24/2018 0546   KETONESUR NEGATIVE 06/24/2018 0546   PROTEINUR NEGATIVE 06/24/2018 0546   UROBILINOGEN 0.2 11/20/2012 1254   NITRITE NEGATIVE 06/24/2018 0546   LEUKOCYTESUR LARGE (A) 06/24/2018 0546    Recent Results (from the past 240 hour(s))  SARS Coronavirus 2 (CEPHEID - Performed in [place] hospital lab), Hosp Order     Status: None   Collection Time: 07/12/2018 12:15 PM  Result Value Ref Range Status   SARS Coronavirus 2 NEGATIVE NEGATIVE Final    Comment: (NOTE) If result is NEGATIVE SARS-CoV-2 target nucleic acids are NOT DETECTED. The SARS-CoV-2 RNA is generally detectable in upper and lower  respiratory specimens during the acute phase of infection. The lowest  concentration of SARS-CoV-2 viral copies this assay can detect is 250  copies / mL. A negative result does not preclude SARS-CoV-2 infection  and should not be used as the sole basis for treatment or other  patient management decisions.  A negative result may occur with  improper specimen collection / handling, submission of specimen other  than nasopharyngeal swab, presence of viral mutation(s) within the  areas targeted by this assay, and inadequate number of viral copies  (<250 copies / mL). A negative result must be combined with clinical  observations, patient history, and epidemiological information. If result is POSITIVE SARS-CoV-2 target nucleic acids are DETECTED. The SARS-CoV-2 RNA is generally detectable in upper and lower  respiratory specimens dur ing the acute phase of infection.  Positive  results are indicative of active infection with  SARS-CoV-2.  Clinical  correlation with patient history and other diagnostic information is  necessary to determine patient infection status.  Positive results do  not rule out bacterial infection or co-infection with other viruses. If result is PRESUMPTIVE POSTIVE SARS-CoV-2 nucleic acids MAY BE PRESENT.   A presumptive positive result was obtained on the submitted specimen  and confirmed on repeat testing.  While 2019 novel coronavirus  (SARS-CoV-2) nucleic acids may be present in the submitted sample  additional confirmatory testing may be necessary for epidemiological  and / or clinical management purposes  to differentiate between  SARS-CoV-2 and other Sarbecovirus currently known to infect humans.  If clinically indicated additional testing with an alternate test  methodology [phone]) is advised. The SARS-CoV-2 RNA is generally  detectable in upper and lower respiratory sp ecimens during the acute  phase of infection. The expected result is Negative. Fact Sheet for Patients:  StrictlyIdeas.no Fact Sheet for Healthcare Providers: BankingDealers.co.za This test is not yet approved or cleared by the [place] FDA and has been authorized for detection and/or diagnosis of SARS-CoV-2 by FDA under an Emergency Use Authorization (EUA).  This EUA will remain in effect (meaning this test can be used) for the duration of the COVID-19 declaration under Section 564(b)(1) of the Act, 21 U.S.C. section 360bbb-3(b)(1), unless the authorization is terminated or revoked sooner. Performed at [facility], [address], [place],  [address]   Urine culture     Status: Abnormal   Collection Time: 06/18/2018  2:38 PM  Result Value Ref  Range Status   Specimen Description   Final    URINE, CLEAN CATCH Performed at [facility], [address] [place], [place] [address]    Special Requests   Final    NONE Performed at [facility],  [address], [place], [place] [address]    Culture 90,000 COLONIES/mL ESCHERICHIA COLI (A)  Final   Report Status 06/21/2018 FINAL  Final   Organism ID, Bacteria ESCHERICHIA COLI (A)  Final      Susceptibility   Escherichia coli - MIC*    AMPICILLIN 4 SENSITIVE Sensitive     CEFAZOLIN <=4 SENSITIVE Sensitive     CEFTRIAXONE <=1 SENSITIVE Sensitive     CIPROFLOXACIN >=4 RESISTANT Resistant     GENTAMICIN <=1 SENSITIVE Sensitive     IMIPENEM <=0.25 SENSITIVE Sensitive     NITROFURANTOIN <=16 SENSITIVE Sensitive     TRIMETH/SULFA <=20 SENSITIVE Sensitive     AMPICILLIN/SULBACTAM <=2 SENSITIVE Sensitive     PIP/TAZO <=4 SENSITIVE Sensitive     Extended ESBL NEGATIVE Sensitive     * 90,000 COLONIES/mL ESCHERICHIA COLI  MRSA PCR Screening     Status: None   Collection Time: 07/08/2018  7:13 PM  Result Value Ref Range Status   MRSA by PCR NEGATIVE NEGATIVE Final    Comment:        The GeneXpert MRSA Assay (FDA approved for NASAL specimens only), is one component of a comprehensive MRSA colonization surveillance program. It is not intended to diagnose MRSA infection nor to guide or monitor treatment for MRSA infections. Performed at [facility], [address], [place], [place] [address]   Culture, blood (routine x 2)     Status: None   Collection Time: 06/13/2018  9:04 PM  Result Value Ref Range Status   Specimen Description BLOOD LEFT HAND  Final   Special Requests   Final    BOTTLES DRAWN AEROBIC AND ANAEROBIC Blood Culture adequate volume   Culture   Final    NO GROWTH 5 DAYS Performed at [facility], [address] [place], [place] [address]    Report Status 06/24/2018 FINAL  Final  Culture, blood (routine x 2)     Status: None   Collection Time: 07/06/2018  9:05 PM  Result Value Ref Range Status   Specimen Description BLOOD RIGHT HAND  Final   Special Requests   Final    BOTTLES DRAWN AEROBIC AND ANAEROBIC Blood Culture adequate volume   Culture   Final    NO GROWTH 5  DAYS Performed at [facility], [address], [place], [place] [address]    Report Status 06/24/2018 FINAL  Final  Culture, Urine     Status: Abnormal   Collection Time: 06/24/18  5:46 AM  Result Value Ref Range Status   Specimen Description   Final    URINE, CATHETERIZED Performed at [facility], [address] [place], [place] [address]    Special Requests   Final    NONE Performed at [facility], [address], [place], [place] [address]    Culture >=100,000 COLONIES/mL YEAST (A)  Final   Report Status 06/26/2018 FINAL  Final     Scheduled Meds:  aspirin  300 mg Rectal Daily   carbidopa-levodopa  2 tablet Oral TID   chlorhexidine gluconate (MEDLINE KIT)  15 mL Mouth Rinse BID   Chlorhexidine Gluconate Cloth  6 each Topical Q0600   feeding supplement (OSMOLITE 1.5 CAL)  1,000 mL Per Tube Q24H   glycopyrrolate  0.2 mg Intravenous QID   insulin aspart  0-9 Units Subcutaneous Q4H   insulin aspart  2 Units Subcutaneous Q4H   levothyroxine  50 mcg Intravenous Daily   mouth rinse  15 mL Mouth Rinse 10 times per day   rotigotine  1 patch Transdermal Daily   Continuous Infusions:  ceFEPime (MAXIPIME) IV Stopped (06/27/18 2152)   dextrose 5 % and 0.45 % NaCl with KCl 20 mEq/L Stopped (06/28/18 0923)   dextrose     famotidine (PEPCID) IV Stopped (06/27/18 0944)   levETIRAcetam Stopped (06/27/18 2212)   metronidazole Stopped (06/28/18 0335)   morphine 10 mg/hr (06/28/18 1048)    Procedures/Studies: Dg Abd 1 View  Result Date: 06/21/2018 CLINICAL DATA:  Check gastric catheter placement EXAM: ABDOMEN - 1 VIEW COMPARISON:  None. FINDINGS: Scattered large and small bowel gas is noted. Degenerative changes of lumbar spine are noted with scoliosis concave to the right. Check gastric catheter is noted in the mid esophagus at the superior aspect of the film. This could be advanced several cm to reach the stomach. IMPRESSION: Gastric catheter in the mid esophagus.  Electronically Signed   By: Inez Catalina M.D.   On: 06/21/2018 15:47   Ct Head Wo Contrast  Result Date: 06/20/2018 CLINICAL DATA:  85 year old with altered mental status. EXAM: CT HEAD WITHOUT CONTRAST TECHNIQUE: Contiguous axial images were obtained from the base of the skull through the vertex without intravenous contrast. COMPARISON:  07/12/2018 FINDINGS: Brain: Stable cerebral atrophy. Negative for acute hemorrhage, mass lesion, midline shift, hydrocephalus or large infarct. Again noted are hypodensities in the white matter suggesting chronic changes. Focal low-density in the right frontal-parietal white matter is compatible with old ischemic changes. Patient's head rotated on this examination. Vascular: No hyperdense vessel or unexpected calcification. Skull: Normal. Negative for fracture or focal lesion. Sinuses/Orbits: No acute finding. Other: None. IMPRESSION: 1. No acute intracranial abnormality. 2. Stable atrophy and chronic white matter changes. Findings are most compatible with chronic small vessel ischemic changes. Electronically Signed   By: Markus Daft M.D.   On: 06/20/2018 15:55   Ct Head Wo Contrast  Result Date: 06/24/2018 CLINICAL DATA:  85 year old female with a history of altered mental status EXAM: CT HEAD WITHOUT CONTRAST TECHNIQUE: Contiguous axial images were obtained from the base of the skull through the vertex without intravenous contrast. COMPARISON:  12/15/2012 FINDINGS: Brain: No acute intracranial hemorrhage. No midline shift or mass effect. Gray-white differentiation maintained. Progression of senescent volume loss. Patchy hypodensities in the periventricular white matter. Unremarkable appearance of the ventricular system. Vascular: Dense calcifications of anterior and posterior circulation. Skull: No acute fracture.  No aggressive bone lesion identified. Sinuses/Orbits: Unremarkable appearance of the orbits. Mastoid air cells clear. No middle ear effusion. No significant sinus  disease. Other: Endotracheal tube in position.  Fluid in the nasopharynx IMPRESSION: Negative for acute intracranial abnormality. Senescent volume loss and evidence of chronic microvascular ischemic disease. Electronically Signed   By: Corrie Mckusick D.O.   On: 07/01/2018 14:11   Dg Chest Port 1 View  Result Date: 06/26/2018 CLINICAL DATA:  85 year old female with respiratory failure in cephalopathy, recently negative for COVID-19. EXAM: PORTABLE CHEST 1 VIEW COMPARISON:  06/25/2018 and earlier. FINDINGS: Portable AP upright view at 0540 hours. Stable lines and tubes. Stable lung volumes. The patient remains mildly rotated to the right. No pneumothorax, pulmonary edema, pleural effusion or consolidation. Mild infrahilar atelectasis suspected. Paucity bowel gas in the upper abdomen. IMPRESSION: 1. Stable lines and tubes. 2.  Continued mild atelectasis. Electronically Signed   By: Genevie Ann M.D.   On: 06/26/2018 08:32   Dg Chest Port 1 View  Result Date: 06/25/2018 CLINICAL DATA:  85 year old female with encephalopathy, intubated. Negative for COVID-19 recently. EXAM: PORTABLE CHEST 1 VIEW COMPARISON:  06/24/2018 and earlier. FINDINGS: Portable AP upright views at 0554 hours. Stable endotracheal tube tip at the level the clavicles. Enteric tube courses to the abdomen, tip not included. Stable right PICC line. Mildly improved lung volumes. Stable cardiac size and mediastinal contours. No pneumothorax. Stable pulmonary vascularity without overt edema. No pleural effusion. Mild patchy lung base opacity most resembles atelectasis. Paucity of bowel gas in the upper abdomen. No acute osseous abnormality identified. IMPRESSION: 1. Stable lines and tubes. 2. Improved lung volumes with mild lung base atelectasis. Electronically Signed   By: Genevie Ann M.D.   On: 06/25/2018 08:02   Dg Chest Port 1 View  Result Date: 06/24/2018 CLINICAL DATA:  Followup ventilator support EXAM: PORTABLE CHEST 1 VIEW COMPARISON:  06/22/2018  FINDINGS: Endotracheal tube tip is 3 cm above the carina. Orogastric or nasogastric tube extends at least to the distal esophagus. Right-sided central line tip at the SVC RA junction. The patient is rotated towards the right. The left lung appears clear. There appears to be volume loss and infiltrate in the right lung. IMPRESSION: Patient rotated towards the right. Possible worsening of infiltrate in volume loss in the right lung. Orogastric or nasogastric tube tip not clearly seen due to underpenetration. Electronically Signed   By: Nelson Chimes M.D.   On: 06/24/2018 10:40   Dg Chest Port 1 View  Result Date: 06/22/2018 CLINICAL DATA:  Respiratory failure. EXAM: PORTABLE CHEST 1 VIEW COMPARISON:  06/21/2018 FINDINGS: The patient is rotated to the left. A right PICC has been placed and terminates over the lower SVC. An endotracheal tube terminates just below the clavicles, unchanged. An enteric tube is suboptimally visualized though now appears to extend into the upper abdomen. The cardiomediastinal silhouette is unchanged. Lung volumes remain low without evidence of airspace consolidation, edema, pleural effusion, pneumothorax. IMPRESSION: 1. Support devices including new right PICC as above. 2. Low lung volumes without evidence of acute airspace disease. Electronically Signed   By: Logan Bores M.D.   On: 06/22/2018 08:16   Dg Chest Port 1 View  Result Date: 06/21/2018 CLINICAL DATA:  Respiratory failure. EXAM: PORTABLE CHEST 1 VIEW COMPARISON:  Radiograph of Jun 20, 2018. FINDINGS: The heart size and mediastinal contours are within normal limits. Endotracheal tube is unchanged in position. Nasogastric tube appears to be looped within the distal esophagus with tip directed back up into the more proximal esophagus. No acute pulmonary disease is noted. No pneumothorax or pleural effusion is noted. Atherosclerosis of thoracic aorta is noted. The visualized skeletal structures are unremarkable. IMPRESSION:  Endotracheal tube is unchanged in position. Nasogastric tube is looped within distal esophagus, with distal tip directed into the more proximal esophagus. No acute cardiopulmonary abnormality seen. Aortic Atherosclerosis (ICD10-I70.0). Electronically Signed   By: Marijo Conception M.D.   On: 06/21/2018 08:48   Dg Chest Port 1 View  Result Date: 06/20/2018 CLINICAL DATA:  85 year old female with repositioning of the endotracheal tube EXAM: PORTABLE CHEST 1 VIEW COMPARISON:  06/20/2018 FINDINGS: Cardiomediastinal silhouette unchanged in size and contour. No evidence of central vascular congestion. The endotracheal tube has been slightly withdrawn, now terminating 2.7 cm above the carina. There is a linear contour of the right lung of uncertain significance.  There are overlying surgical lines tubes in EKG leads of the right chest somewhat obscuring evaluation. Low lung volumes. Pleuroparenchymal opacity of the right lung, may represent chronic changes and some pleural fluid. No new airspace disease. IMPRESSION: Endotracheal tube has been slightly withdrawn now terminating 2.7 cm above the carina. There is linear contour of the right lung extending in a convex configuration towards the costophrenic angle. While this is favored to represent artifact, a right pneumothorax cannot be excluded. Correlation with physical exam may be useful, as well as a repeat chest x-ray with adequate positioning and removal of the obscuring surgical line/hardware and EKG leads. These results were discussed by telephone at the time of interpretation on 06/20/2018 at 9:21 am with the nurse caring for the patient, Ms Sherry Ruffing. Electronically Signed   By: Corrie Mckusick D.O.   On: 06/20/2018 09:21   Dg Chest Port 1 View  Result Date: 06/20/2018 CLINICAL DATA:  Unresponsive. EXAM: PORTABLE CHEST 1 VIEW COMPARISON:  Radiograph Jun 19, 2018. FINDINGS: The heart size and mediastinal contours are within normal limits. Atherosclerosis of  thoracic aorta is noted. Endotracheal tube is directed into right mainstem bronchus; withdrawal by 3-4 cm is recommended. Both lungs are clear. The visualized skeletal structures are unremarkable. IMPRESSION: Endotracheal tube directed into right mainstem bronchus; withdrawal by 3-4 cm is recommended. No acute cardiopulmonary abnormality seen. These results will be called to the ordering clinician or representative by the Radiologist Assistant, and communication documented in the PACS or zVision Dashboard. Electronically Signed   By: Marijo Conception M.D.   On: 06/20/2018 08:16   Dg Chest Portable 1 View  Result Date: 06/18/2018 CLINICAL DATA:  Unresponsive. EXAM: PORTABLE CHEST 1 VIEW COMPARISON:  06/27/2010 FINDINGS: 1301 hours. Endotracheal tube tip is approximately 1.4 cm above the base of the carina. The lungs are clear without focal pneumonia, edema, pneumothorax or pleural effusion. The cardiopericardial silhouette is within normal limits for size. The visualized bony structures of the thorax are intact. Telemetry leads overlie the chest. IMPRESSION: No active disease. Electronically Signed   By: Misty Stanley M.D.   On: 07/06/2018 13:23   Dg Chest Port 1v Same Day  Result Date: 06/20/2018 CLINICAL DATA:  Intubated patient. Possible pneumothorax on plain film of the chest earlier today. EXAM: PORTABLE CHEST 1 VIEW COMPARISON:  Single-view of the chest earlier today. FINDINGS: Endotracheal tube is in place with the tip well above the carina. There is no pneumothorax. Finding described on the prior report was likely a skin fold. Lungs clear. Heart size normal. IMPRESSION: Negative for pneumothorax.  No acute disease. ETT in good position. Electronically Signed   By: Inge Rise M.D.   On: 06/20/2018 10:16   Dg Addison Bailey G Tube Plc W/fl W/rad  Result Date: 06/23/2018 CLINICAL DATA:  Difficulty advancing OG tube into the stomach EXAM: NASO G TUBE PLACEMENT WITH FL AND WITH RAD CONTRAST:  20 mL Omnipaque  300 FLUOROSCOPY TIME:  Fluoroscopy Time:  5 seconds Radiation Exposure Index (if provided by the fluoroscopic device): Number of Acquired Spot Images: 0 COMPARISON:  Abdomen film Jun 21, 2018 FINDINGS: Fluoroscopy over the lower chest and upper abdomen demonstrates the tube to have advanced since prior KUB with the tip in the proximal stomach. This was advanced slightly further with the tip now in the mid to distal stomach. Contrast was injected to confirm placement. IMPRESSION: OG tube advanced into the mid to distal stomach. Electronically Signed   By: Rolm Baptise M.D.  On: 06/23/2018 15:49    Barton Dubois, MD  [facility] Pager [phone]  06/28/2018, 3:15 PM   LOS: 9 days

## 2018-06-29 NOTE — Progress Notes (Signed)
PROGRESS NOTE  Jennifer ColasSarah S [name] RUE:[id number]:[id number] DOB: 1930/04/04 DOA: 06/27/2018 PCP: Joette CatchingNyland, Leonard, MD  Brief History: 85 year old female with a history of TIA, Parkinson's disease, hypothyroidism, hypertension, diabetes mellitus, hyperlipidemia, PSVT status post RFA 2006, and tobacco abuse in remission presented with unresponsiveness. The patient lives by herself, but her granddaughter intermittently checks up on her. On 06/18/2018, the patient granddaughter noted the patient to be lethargic, but felt that the patient was just tired and sleepy. She went back to check up on the patient on the morning of 07/10/2018 and noted the patient to be essentially unresponsive. EMS was activated. In the emergency department, the patient was intubated to protect her airway secondary to her encephalopathy. According to the patient's daughter, the patient has had a long history of poor compliance with her medications. It was felt in the past that her encephalopathy may have been partly attributed to not taking her Parkinson's medicines as well as he thyroid replacement. However, the patient has never been unresponsive. There is been no history of recent fevers, chills, complaints of chest pain, shortness breath, vomiting, diarrhea. At baseline, the patient has some pleasant confusion but is able to converse. She normally gets around in a wheelchair, but occasionally uses a walker. In the emergency department, the patient had temperature 100.4 F, but blood pressure as high as 208/73. EKG showed sinus tachycardia without any ST-T wave changes. CPK was 1065. UA showed 21-50 WBC. Otherwise BMP and CBC were essentially unremarkable. CT of the brain was negative for any acute findings. Chest x-ray was negative for any infiltrates. Neurology and pulmonarywereconsulted to assist with management.  Assessment/Plan: Acute encephalopathy, type unspecified -Etiology unclear although likely multifactorial  including UTI, B12 deficiency, possible seizure, levodopa withdraw -Appreciate neurology consult -06/26/2018 CT brain negative for acute findings -MRI of brain--cannot obtain while on ventilator -06/20/18 CT brain--neg for acute findings -Folic acid--7.3 -Ammonia--12 -TSH 0.462 -Urine drug screen negative -Personally reviewed EKG--sinus rhythm, no ST-T wave changes -06/23/2018--patient remainsminimally responsivetoprotopathic stimuli despite being off sedation. -palliative care discussed with family and decision has been made for one way extubation and comfort care only. Patient extubated on 06/28/18. There is presence of intermittent agonal breathing on today's exam.  -Continue morphine drip and symptomatic management -Obtained EEG--global slowing and triphasics consistent with metabolic encephalopathy -continue empiric IV keppra -Continue Rotigotine transdermally.   Acute respiratory failure with hypoxia -Secondary to hypoventilation from encephalopathy; now due to HCAP and pulmonary edema -Currently on mechanical ventilation (intubated since 5/7) -5/12 personally reviewed CXR--increase interstitial markings increase RLL opacity; suggesting pneumonia development. -06/23/18--started enteral feeding -Plan is to proceed with compassionate extubation later today (06/28/2018); appreciate pulmonology's assistance and recommendation.. -Anticipate hospital diet -Continue morphine drip.  HCAP/Aspiration pneumonitis -CXR as discussed above, just an aspiration pneumonitis/new infiltrates. -tracheal aspirate for culture taken. -blood cultures x 2 without any growth. -Patient currently afebrile. -And is for full comfort and symptomatic management only. -Will discontinue antibiotics  UTI--EColi -appropriately treated with ceftriaxone for 5 days.  B12 deficiency -Repleted IM X 7 days -will now focus on comfort care.  Parkinson's Disease -appreciate GI assistance-->OG placed to  mid-esophagus-->trial of meds only with patient sitting at 45 degrees -continue carbidopa/levidopa  -appreciate neurology assistance. -Stop all medications that can not be given orally after compassionate extubation. -Continue Rotigotine transderma patch.  Nontraumatic rhabdomyolysis -Initial CPK 1065>>>575>>>414 -IV fluids has been discontinued at this time -Continue symptomatic management only.  Elevated troponin -Secondary to demand ischemia -Trend  is flat -Echocardiogram--60-65%, no PFO, grade 1 DD -Plan is for full comfort -No further work-up or interventions.  FEN -Full comfort care -No artificial nutrition.  Diabetes mellitus type 2 -continue Holding glipizide and Tradjenta currently -Hemoglobin A1c--6.2 -Now that tube feeding has been discontinue and that the plan of care is just for comfort; will discontinue CBGs and insulin therapy.  Hypothyroidism -We will discontinue Synthroid -Plan of care is just comfort.  Hyperlipidemia -Symptomatic management and comfort care -Statin has been discontinued.    Disposition Plan:Remain in hospital; anticipate hospital death.  Family Communication:Daughter updated on phone 5/13  Consultants:pulmonary, palliative care; neurology  Code Status: DNR  DVT Prophylaxis: [place] Lovenox   Procedures: As Listed in Progress Note Above  Antibiotics: Ceftriaxone 5/7>>5/11  Subjective: Patient without fever.  Has remained unresponsive.  Status post compassionate extubation on 06/28/2018.  On exam intermittent episode of agonal breathing are seen.  Anticipate hospital death   Objective: Vitals:   July 24, 2018 1200 July 24, 2018 1300 July 24, 2018 1400 2018-07-24 1500  BP:      Pulse:      Resp: Temp:      TempSrc:      SpO2:      Weight:      Height:        Intake/Output Summary (Last 24 hours) at 07-24-18 1717 Last data filed at July 24, 2018 1510 Gross per 24 hour  Intake 189.4 ml  Output --  Net  189.4 ml   Weight change:   Exam: General exam: Afebrile, has remained unresponsive.  Comfortable in appearance.  Intermittent episode of agonal breathing appreciated on exam. Respiratory system: No wheezing, scattered rhonchi. Cardiovascular system:RRR. No murmurs, rubs, gallops. Gastrointestinal system: Abdomen is nondistended, soft and nontender. No organomegaly or masses felt. Normal bowel sounds heard. Central nervous system: Alert and oriented. No focal neurological deficits. Extremities: No cyanosis or clubbing.  SCDs in place.  Upper extremities swelling on exam bilaterally. Psychiatry: Has remained unresponsive.   Data Reviewed: I have personally reviewed following labs and imaging studies  Basic Metabolic Panel: Recent Labs  Lab 06/23/18 0506 06/24/18 0409 06/25/18 0519  NA 139 135 135  K 4.1 4.2 4.8  CL 112* 109 107  CO2 20* 19* 20*  GLUCOSE 187* 261* 291*  BUN CREATININE 0.58 0.71 0.82  CALCIUM 8.7* 8.2* 8.7*  MG 1.5*  --   --   PHOS 2.4*  --   --    Liver Function Tests: Recent Labs  Lab 06/24/18 0409  AST 25  ALT 9  ALKPHOS 59  BILITOT 0.4  PROT 4.9*  ALBUMIN 2.1*   CBC: Recent Labs  Lab 06/23/18 0506 06/24/18 0409 06/25/18 0519  WBC 7.4 6.9 13.5*  NEUTROABS 6.0  --   --   HGB 9.7* 9.0* 10.4*  HCT 31.7* 28.5* 31.3*  MCV 93.5 93.4 91.0  PLT 140* 145* 162   Cardiac Enzymes: Recent Labs  Lab 06/24/18 0409  CKTOTAL 414*   CBG: Recent Labs  Lab 06/27/18 1119 06/27/18 1626 06/27/18 1954 06/27/18 2352 06/28/18 0411  GLUCAP 195* 185* 166* 202* 203*   Urine analysis:    Component Value Date/Time   COLORURINE YELLOW 06/24/2018 0546   APPEARANCEUR CLOUDY (A) 06/24/2018 0546   LABSPEC 1.012 06/24/2018 0546   PHURINE 5.0 06/24/2018 0546   GLUCOSEU 150 (A) 06/24/2018 0546   HGBUR SMALL (A) 06/24/2018 0546   BILIRUBINUR NEGATIVE 06/24/2018 0546   KETONESUR NEGATIVE 06/24/2018 0546  PROTEINUR NEGATIVE 06/24/2018 0546    UROBILINOGEN 0.2 11/20/2012 1254   NITRITE NEGATIVE 06/24/2018 0546   LEUKOCYTESUR LARGE (A) 06/24/2018 0546    Recent Results (from the past 240 hour(s))  MRSA PCR Screening     Status: None   Collection Time: 07/07/2018  7:13 PM  Result Value Ref Range Status   MRSA by PCR NEGATIVE NEGATIVE Final    Comment:        The GeneXpert MRSA Assay (FDA approved for NASAL specimens only), is one component of a comprehensive MRSA colonization surveillance program. It is not intended to diagnose MRSA infection nor to guide or monitor treatment for MRSA infections. Performed at [facility], [address], [place], [place] [address]   Culture, blood (routine x 2)     Status: None   Collection Time: 07/07/2018  9:04 PM  Result Value Ref Range Status   Specimen Description BLOOD LEFT HAND  Final   Special Requests   Final    BOTTLES DRAWN AEROBIC AND ANAEROBIC Blood Culture adequate volume   Culture   Final    NO GROWTH 5 DAYS Performed at [facility], [address], [place], [place] [address]    Report Status 06/24/2018 FINAL  Final  Culture, blood (routine x 2)     Status: None   Collection Time: 07-Jul-2018  9:05 PM  Result Value Ref Range Status   Specimen Description BLOOD RIGHT HAND  Final   Special Requests   Final    BOTTLES DRAWN AEROBIC AND ANAEROBIC Blood Culture adequate volume   Culture   Final    NO GROWTH 5 DAYS Performed at [facility], [address], [place], [place] [address]    Report Status 06/24/2018 FINAL  Final  Culture, Urine     Status: Abnormal   Collection Time: 06/24/18  5:46 AM  Result Value Ref Range Status   Specimen Description   Final    URINE, CATHETERIZED Performed at [facility], [address], [place], [place] [address]    Special Requests   Final    NONE Performed at [facility], [address], [place], [place] [address]    Culture >=100,000 COLONIES/mL YEAST (A)  Final   Report Status 06/26/2018 FINAL  Final     Scheduled Meds:   glycopyrrolate  0.2 mg Intravenous QID   rotigotine  1 patch Transdermal Daily   Continuous Infusions:  morphine 5 mg/hr (06/29/18 1510)    Procedures/Studies: Dg Abd 1 View  Result Date: 06/21/2018 CLINICAL DATA:  Check gastric catheter placement EXAM: ABDOMEN - 1 VIEW COMPARISON:  None. FINDINGS: Scattered large and small bowel gas is noted. Degenerative changes of lumbar spine are noted with scoliosis concave to the right. Check gastric catheter is noted in the mid esophagus at the superior aspect of the film. This could be advanced several cm to reach the stomach. IMPRESSION: Gastric catheter in the mid esophagus. Electronically Signed   By: Alcide Clever M.D.   On: 06/21/2018 15:47   Ct Head Wo Contrast  Result Date: 06/20/2018 CLINICAL DATA:  85 year old with altered mental status. EXAM: CT HEAD WITHOUT CONTRAST TECHNIQUE: Contiguous axial images were obtained from the base of the skull through the vertex without intravenous contrast. COMPARISON:  07/07/18 FINDINGS: Brain: Stable cerebral atrophy. Negative for acute hemorrhage, mass lesion, midline shift, hydrocephalus or large infarct. Again noted are hypodensities in the white matter suggesting chronic changes. Focal low-density in the right frontal-parietal white matter is compatible with old ischemic  changes. Patient's head rotated on this examination. Vascular: No hyperdense vessel or unexpected calcification. Skull: Normal. Negative for fracture or focal lesion. Sinuses/Orbits: No acute finding. Other: None. IMPRESSION: 1. No acute intracranial abnormality. 2. Stable atrophy and chronic white matter changes. Findings are most compatible with chronic small vessel ischemic changes. Electronically Signed   By: Richarda Overlie M.D.   On: 06/20/2018 15:55   Ct Head Wo Contrast  Result Date: 07/10/2018 CLINICAL DATA:  85 year old female with a history of altered mental status EXAM: CT HEAD WITHOUT CONTRAST TECHNIQUE: Contiguous axial images were  obtained from the base of the skull through the vertex without intravenous contrast. COMPARISON:  12/15/2012 FINDINGS: Brain: No acute intracranial hemorrhage. No midline shift or mass effect. Gray-white differentiation maintained. Progression of senescent volume loss. Patchy hypodensities in the periventricular white matter. Unremarkable appearance of the ventricular system. Vascular: Dense calcifications of anterior and posterior circulation. Skull: No acute fracture.  No aggressive bone lesion identified. Sinuses/Orbits: Unremarkable appearance of the orbits. Mastoid air cells clear. No middle ear effusion. No significant sinus disease. Other: Endotracheal tube in position.  Fluid in the nasopharynx IMPRESSION: Negative for acute intracranial abnormality. Senescent volume loss and evidence of chronic microvascular ischemic disease. Electronically Signed   By: Gilmer Mor D.O.   On: 07/10/2018 14:11   Dg Chest Port 1 View  Result Date: 06/26/2018 CLINICAL DATA:  85 year old female with respiratory failure in cephalopathy, recently negative for COVID-19. EXAM: PORTABLE CHEST 1 VIEW COMPARISON:  06/25/2018 and earlier. FINDINGS: Portable AP upright view at 0540 hours. Stable lines and tubes. Stable lung volumes. The patient remains mildly rotated to the right. No pneumothorax, pulmonary edema, pleural effusion or consolidation. Mild infrahilar atelectasis suspected. Paucity bowel gas in the upper abdomen. IMPRESSION: 1. Stable lines and tubes. 2. Continued mild atelectasis. Electronically Signed   By: Odessa Fleming M.D.   On: 06/26/2018 08:32   Dg Chest Port 1 View  Result Date: 06/25/2018 CLINICAL DATA:  85 year old female with encephalopathy, intubated. Negative for COVID-19 recently. EXAM: PORTABLE CHEST 1 VIEW COMPARISON:  06/24/2018 and earlier. FINDINGS: Portable AP upright views at 0554 hours. Stable endotracheal tube tip at the level the clavicles. Enteric tube courses to the abdomen, tip not included.  Stable right PICC line. Mildly improved lung volumes. Stable cardiac size and mediastinal contours. No pneumothorax. Stable pulmonary vascularity without overt edema. No pleural effusion. Mild patchy lung base opacity most resembles atelectasis. Paucity of bowel gas in the upper abdomen. No acute osseous abnormality identified. IMPRESSION: 1. Stable lines and tubes. 2. Improved lung volumes with mild lung base atelectasis. Electronically Signed   By: Odessa Fleming M.D.   On: 06/25/2018 08:02   Dg Chest Port 1 View  Result Date: 06/24/2018 CLINICAL DATA:  Followup ventilator support EXAM: PORTABLE CHEST 1 VIEW COMPARISON:  06/22/2018 FINDINGS: Endotracheal tube tip is 3 cm above the carina. Orogastric or nasogastric tube extends at least to the distal esophagus. Right-sided central line tip at the SVC RA junction. The patient is rotated towards the right. The left lung appears clear. There appears to be volume loss and infiltrate in the right lung. IMPRESSION: Patient rotated towards the right. Possible worsening of infiltrate in volume loss in the right lung. Orogastric or nasogastric tube tip not clearly seen due to underpenetration. Electronically Signed   By: Paulina Fusi M.D.   On: 06/24/2018 10:40   Dg Chest Port 1 View  Result Date: 06/22/2018 CLINICAL DATA:  Respiratory failure. EXAM: PORTABLE CHEST  1 VIEW COMPARISON:  06/21/2018 FINDINGS: The patient is rotated to the left. A right PICC has been placed and terminates over the lower SVC. An endotracheal tube terminates just below the clavicles, unchanged. An enteric tube is suboptimally visualized though now appears to extend into the upper abdomen. The cardiomediastinal silhouette is unchanged. Lung volumes remain low without evidence of airspace consolidation, edema, pleural effusion, pneumothorax. IMPRESSION: 1. Support devices including new right PICC as above. 2. Low lung volumes without evidence of acute airspace disease. Electronically Signed   By:  Sebastian Ache M.D.   On: 06/22/2018 08:16   Dg Chest Port 1 View  Result Date: 06/21/2018 CLINICAL DATA:  Respiratory failure. EXAM: PORTABLE CHEST 1 VIEW COMPARISON:  Radiograph of Jun 20, 2018. FINDINGS: The heart size and mediastinal contours are within normal limits. Endotracheal tube is unchanged in position. Nasogastric tube appears to be looped within the distal esophagus with tip directed back up into the more proximal esophagus. No acute pulmonary disease is noted. No pneumothorax or pleural effusion is noted. Atherosclerosis of thoracic aorta is noted. The visualized skeletal structures are unremarkable. IMPRESSION: Endotracheal tube is unchanged in position. Nasogastric tube is looped within distal esophagus, with distal tip directed into the more proximal esophagus. No acute cardiopulmonary abnormality seen. Aortic Atherosclerosis (ICD10-I70.0). Electronically Signed   By: Lupita Raider M.D.   On: 06/21/2018 08:48   Dg Chest Port 1 View  Result Date: 06/20/2018 CLINICAL DATA:  85 year old female with repositioning of the endotracheal tube EXAM: PORTABLE CHEST 1 VIEW COMPARISON:  06/20/2018 FINDINGS: Cardiomediastinal silhouette unchanged in size and contour. No evidence of central vascular congestion. The endotracheal tube has been slightly withdrawn, now terminating 2.7 cm above the carina. There is a linear contour of the right lung of uncertain significance. There are overlying surgical lines tubes in EKG leads of the right chest somewhat obscuring evaluation. Low lung volumes. Pleuroparenchymal opacity of the right lung, may represent chronic changes and some pleural fluid. No new airspace disease. IMPRESSION: Endotracheal tube has been slightly withdrawn now terminating 2.7 cm above the carina. There is linear contour of the right lung extending in a convex configuration towards the costophrenic angle. While this is favored to represent artifact, a right pneumothorax cannot be excluded.  Correlation with physical exam may be useful, as well as a repeat chest x-ray with adequate positioning and removal of the obscuring surgical line/hardware and EKG leads. These results were discussed by telephone at the time of interpretation on 06/20/2018 at 9:21 am with the nurse caring for the patient, Ms Shon Baton. Electronically Signed   By: Gilmer Mor D.O.   On: 06/20/2018 09:21   Dg Chest Port 1 View  Result Date: 06/20/2018 CLINICAL DATA:  Unresponsive. EXAM: PORTABLE CHEST 1 VIEW COMPARISON:  Radiograph Jun 19, 2018. FINDINGS: The heart size and mediastinal contours are within normal limits. Atherosclerosis of thoracic aorta is noted. Endotracheal tube is directed into right mainstem bronchus; withdrawal by 3-4 cm is recommended. Both lungs are clear. The visualized skeletal structures are unremarkable. IMPRESSION: Endotracheal tube directed into right mainstem bronchus; withdrawal by 3-4 cm is recommended. No acute cardiopulmonary abnormality seen. These results will be called to the ordering clinician or representative by the Radiologist Assistant, and communication documented in the PACS or zVision Dashboard. Electronically Signed   By: Lupita Raider M.D.   On: 06/20/2018 08:16   Dg Chest Portable 1 View  Result Date: 06/23/2018 CLINICAL DATA:  Unresponsive. EXAM: PORTABLE CHEST  1 VIEW COMPARISON:  06/27/2010 FINDINGS: 1301 hours. Endotracheal tube tip is approximately 1.4 cm above the base of the carina. The lungs are clear without focal pneumonia, edema, pneumothorax or pleural effusion. The cardiopericardial silhouette is within normal limits for size. The visualized bony structures of the thorax are intact. Telemetry leads overlie the chest. IMPRESSION: No active disease. Electronically Signed   By: Kennith Center M.D.   On: 04-Jul-2018 13:23   Dg Chest Port 1v Same Day  Result Date: 06/20/2018 CLINICAL DATA:  Intubated patient. Possible pneumothorax on plain film of the chest earlier  today. EXAM: PORTABLE CHEST 1 VIEW COMPARISON:  Single-view of the chest earlier today. FINDINGS: Endotracheal tube is in place with the tip well above the carina. There is no pneumothorax. Finding described on the prior report was likely a skin fold. Lungs clear. Heart size normal. IMPRESSION: Negative for pneumothorax.  No acute disease. ETT in good position. Electronically Signed   By: Drusilla Kanner M.D.   On: 06/20/2018 10:16   Dg Vangie Bicker G Tube Plc W/fl W/rad  Result Date: 06/23/2018 CLINICAL DATA:  Difficulty advancing OG tube into the stomach EXAM: NASO G TUBE PLACEMENT WITH FL AND WITH RAD CONTRAST:  20 mL Omnipaque 300 FLUOROSCOPY TIME:  Fluoroscopy Time:  5 seconds Radiation Exposure Index (if provided by the fluoroscopic device): Number of Acquired Spot Images: 0 COMPARISON:  Abdomen film Jun 21, 2018 FINDINGS: Fluoroscopy over the lower chest and upper abdomen demonstrates the tube to have advanced since prior KUB with the tip in the proximal stomach. This was advanced slightly further with the tip now in the mid to distal stomach. Contrast was injected to confirm placement. IMPRESSION: OG tube advanced into the mid to distal stomach. Electronically Signed   By: Charlett Nose M.D.   On: 06/23/2018 15:49    Vassie Loll, MD  [facility] Pager [phone]  06/29/2018, 5:17 PM   LOS: 10 days

## 2018-06-30 MED ORDER — SCOPOLAMINE 1 MG/3DAYS TD PT72
1.0000 | MEDICATED_PATCH | TRANSDERMAL | Status: DC
  Administered 2018-06-30: 1.5 mg via TRANSDERMAL
  Filled 2018-06-30: qty 1

## 2018-07-01 ENCOUNTER — Encounter (HOSPITAL_COMMUNITY): Payer: Self-pay | Admitting: Internal Medicine

## 2018-07-01 NOTE — Discharge Summary (Signed)
Death Summary  Jennifer ColasSarah S Mangold ZOX:[id number] DOB: 02-29-1930 DOA: 07/01/2018  PCP: Joette CatchingNyland, Leonard, MD PCP/Office notified: through Epic.  Admit date: 07/10/2018 Date of Death: 07/01/2018  Final Diagnoses:  Unresponsiveness Acute resp failure with hypoxia Acute encephalopathy Acute respiratory failure with hypoxia (HCC) Rhabdomyolysis Feeding difficulty in elderly Goals of care, counseling/discussion Palliative care encounter Acute cystitis with hematuria Hx of Parkinson's disease Aspiration PNA Hypothyroidism Hx of TIA Type 2 diabetes HTN   History of present illness:  85 y.o. female with medical history significant for TIA, Parkinson's disease, hypothyroidism, hypertension, diabetes who was brought to the ED unresponsive.  History is obtained from chart review and EDP.  Patient was last seen normal 2 days ago.  We will attempt to contact family unsuccessful.  Both numbers listed in chart to go to voicemail.  ED Course: On arrival in the ED O2 sats was in the 70s.  Patient was on nonrebreather.  Due to mental status patient was intubated to protect her airway.  UDS was clean.  UA positive for nitrites and leukocytes, many bacteria, 21-50 WBCs.  SARS-CoV-2 test negative.  WBC 7.6, hemoglobin 14.3.  CMP with mildly low bicarb 18, anion gap 16.  CK elevated at 1065.  ABG 7, showed pH 7.4, PCO2 30, PO2 elevated at 407. Normal TSH  0.46.  Portable chest x-ray-post intubation, shows ET tube above carina otherwise no acute abnormality.  Head CT-negative for acute intracranial abnormality. Patient was started on IV ceftriaxone, hospitalist to admit for unresponsiveness.   Hospital Course:  Acute encephalopathy, type unspecified -Etiology unclear although likely multifactorial including UTI, B12 deficiency, possible seizure, levodopa withdraw -Appreciate neurology consult -06/25/2018 CT brain negative for acute findings -MRI of brain--cannot obtain while on ventilator -06/20/18 CT  brain--neg for acute findings -Folic acid--7.3 -Ammonia--12 -TSH 0.462 -Urine drug screen negative -Personally reviewed EKG--sinus rhythm, no ST-T wave changes -06/23/2018--patient remainsminimally responsivetoprotopathic stimuli despite being off sedation. -palliative care discussed with family and decision has been made for one way extubation and comfort care only. Patient extubated on 06/28/18.  -patient was kept on medications comfort and symptoms management -expired at 23:13 on 07-01-2018  Acute respiratory failure with hypoxia -Secondary to hypoventilation from encephalopathy; now due to HCAP and pulmonary edema -Currently on mechanical ventilation (intubated since 5/7) -5/12 personally reviewed CXR--increase interstitial markings increase RLL opacity; suggesting pneumonia development. -06/23/18--started enteral feeding -Plan decided to proceed with compassionate extubation on (06/28/2018); appreciate pulmonology's assistance and recommendations.. -patient kept on morphine for comfort. -patient peacefully expired on 07-01-2018 at 23:13.  HCAP/Aspiration pneumonitis -CXR as discussed above, just an aspiration pneumonitis/new infiltrates. -tracheal aspirate for culture taken. -blood cultures x 2 without any growth. -Patient treated appropriately with antibiotics -remained afebrile -abx's discontinued once transitioned to full comfort care.   UTI--EColi -appropriately treated with antibiotics. -no fever appreciated after that.  B12 deficiency -Repleted IM X 7 days -Plan of care decided for comfort care only.  Parkinson's Disease -appreciate GI assistance-->OG placed to mid-esophagus-->trial of meds only with patient sitting at 45 degrees -continue carbidopa/levidopa  -appreciate neurology assistance. -Stoppe all medications that couldn't be given orally after compassionate extubation. -Rotigotine transderma patch was continued.  Nontraumatic rhabdomyolysis -Initial CPK  [id number]>>>[id number] -patient received fluid resuscitation, until decision for full comfort care decided. -symptomatic management only implemented   Elevated troponin -Secondary to demand ischemia -Trend is flat -Echocardiogram--60-65%, no PFO, grade 1 DD -Plan of care decided for full comfort only -No further work-up or interventions.  FEN -Full comfort care -No artificial nutrition  or intervention decided..  Diabetes mellitus type 2 -oral hypoglycemic agents and insulin therapy discontinued -no further CBG's -patient was kept comfortable.  Hypothyroidism -once decision for comfort care only decided -synthroid was discontinued.  Hyperlipidemia -Symptomatic management and comfort care only. -Statin discontinued.  Increase oral secretions -Patient started on Robinul and scopolamine patch.  Time: 25 minutes  Signed:  Vassie Loll  [facility] 07/01/2018, 9:02 AM

## 2018-07-14 NOTE — Progress Notes (Signed)
PROGRESS NOTE  Jennifer ColasSarah S [name] RUE:[id number]:[id number] DOB: 1930/04/04 DOA: 06/27/2018 PCP: Jennifer [name]  Brief History: 85 year old female with a history of TIA, Parkinson's disease, hypothyroidism, hypertension, diabetes mellitus, hyperlipidemia, PSVT status post RFA 2006, and tobacco abuse in remission presented with unresponsiveness. The patient lives by herself, but her granddaughter intermittently checks up on her. On 06/18/2018, the patient granddaughter noted the patient to be lethargic, but felt that the patient was just tired and sleepy. She went back to check up on the patient on the morning of 07/10/2018 and noted the patient to be essentially unresponsive. EMS was activated. In the emergency department, the patient was intubated to protect her airway secondary to her encephalopathy. According to the patient's daughter, the patient has had a long history of poor compliance with her medications. It was felt in the past that her encephalopathy may have been partly attributed to not taking her Parkinson's medicines as well as he thyroid replacement. However, the patient has never been unresponsive. There is been no history of recent fevers, chills, complaints of chest pain, shortness breath, vomiting, diarrhea. At baseline, the patient has some pleasant confusion but is able to converse. She normally gets around in a wheelchair, but occasionally uses a walker. In the emergency department, the patient had temperature 100.4 F, but blood pressure as high as 208/73. EKG showed sinus tachycardia without any ST-T wave changes. CPK was 1065. UA showed 21-50 WBC. Otherwise BMP and CBC were essentially unremarkable. CT of the brain was negative for any acute findings. Chest x-ray was negative for any infiltrates. Neurology and pulmonarywereconsulted to assist with management.  Assessment/Plan: Acute encephalopathy, type unspecified -Etiology unclear although likely multifactorial  including UTI, B12 deficiency, possible seizure, levodopa withdraw -Appreciate neurology consult -06/26/2018 CT brain negative for acute findings -MRI of brain--cannot obtain while on ventilator -06/20/18 CT brain--neg for acute findings -Folic acid--7.3 -Ammonia--12 -TSH 0.462 -Urine drug screen negative -Personally reviewed EKG--sinus rhythm, no ST-T wave changes -06/23/2018--patient remainsminimally responsivetoprotopathic stimuli despite being off sedation. -palliative care discussed with family and decision has been made for one way extubation and comfort care only. Patient extubated on 06/28/18. There is presence of intermittent agonal breathing on today's exam.  -Continue morphine drip and symptomatic management -Obtained EEG--global slowing and triphasics consistent with metabolic encephalopathy -continue empiric IV keppra -Continue Rotigotine transdermally.   Acute respiratory failure with hypoxia -Secondary to hypoventilation from encephalopathy; now due to HCAP and pulmonary edema -Currently on mechanical ventilation (intubated since 5/7) -5/12 personally reviewed CXR--increase interstitial markings increase RLL opacity; suggesting pneumonia development. -06/23/18--started enteral feeding -Plan is to proceed with compassionate extubation later today (06/28/2018); appreciate pulmonology's assistance and recommendation.. -Anticipate hospital diet -Continue morphine drip.  HCAP/Aspiration pneumonitis -CXR as discussed above, just an aspiration pneumonitis/new infiltrates. -tracheal aspirate for culture taken. -blood cultures x 2 without any growth. -Patient currently afebrile. -And is for full comfort and symptomatic management only. -Will discontinue antibiotics  UTI--EColi -appropriately treated with ceftriaxone for 5 days.  B12 deficiency -Repleted IM X 7 days -will now focus on comfort care.  Parkinson's Disease -appreciate GI assistance-->OG placed to  mid-esophagus-->trial of meds only with patient sitting at 45 degrees -continue carbidopa/levidopa  -appreciate neurology assistance. -Stop all medications that can not be given orally after compassionate extubation. -Continue Rotigotine transderma patch.  Nontraumatic rhabdomyolysis -Initial CPK 1065>>>575>>>414 -IV fluids has been discontinued at this time -Continue symptomatic management only.  Elevated troponin -Secondary to demand ischemia -Trend  is flat -Echocardiogram--60-65%, no PFO, grade 1 DD -Plan is for full comfort -No further work-up or interventions.  FEN -Full comfort care -No artificial nutrition.  Diabetes mellitus type 2 -continue Holding glipizide and Tradjenta currently -Hemoglobin A1c--6.2 -Now that tube feeding has been discontinue and that the plan of care is just for comfort; will discontinue CBGs and insulin therapy.  Hypothyroidism -We will discontinue Synthroid -Plan of care is just comfort.  Hyperlipidemia -Symptomatic management and comfort care -Statin has been discontinued.  Increase oral secretions -Continue Robinul and add a scopolamine patch. -Continue symptomatic management and comfort care.  Disposition Plan:Remain in hospital; anticipate hospital death.  Family Communication:Daughter updated on phone 5/13  Consultants:pulmonary, palliative care; neurology  Code Status: DNR  DVT Prophylaxis:  Lovenox   Procedures: As Listed in Progress Note Above  Antibiotics: Ceftriaxone 5/7>>5/11  Subjective: No fever.  Patient has remained unresponsive.  Having intermittent episode of agonal breathing and increased oral secretions.   Objective: Vitals:   06/22/2018 0404 07/12/2018 0500 07/10/2018 0600 06/20/2018 0610  BP: (!) 91/37   (!) 87/39  Pulse:      Resp: 13 12 12 13   Temp:      TempSrc:      SpO2:      Weight:  71.8 kg    Height:        Intake/Output Summary (Last 24 hours) at 06/21/2018 1016 Last  data filed at 07/05/2018 0600 Gross per 24 hour  Intake 66.42 ml  Output --  Net 66.42 ml   Weight change:   Exam: General exam: No fever, remains unresponsive.  Continues to have intermittent episodes of agonal breathing and is demonstrating increased oral secretions. Respiratory system: No wheezing, positive rhonchi at bilaterally. Cardiovascular system:RRR. No rubs or gallops. Gastrointestinal system: Abdomen is nondistended, soft and nontender. No organomegaly or masses felt. Normal bowel sounds heard. Central nervous system: Unresponsive and unable to properly assess any neurologic exam. Extremities: No cyanosis or clubbing.  There have been improvement in her upper extremities swelling. Skin: No rashes, no petechiae. Psychiatry: Unresponsive.  Data Reviewed: I have personally reviewed following labs and imaging studies  Basic Metabolic Panel: Recent Labs  Lab 06/24/18 0409 06/25/18 0519  NA 135 135  K 4.2 4.8  CL 109 107  CO2 19* 20*  GLUCOSE 261* 291*  BUN 15 20  CREATININE 0.71 0.82  CALCIUM 8.2* 8.7*   Liver Function Tests: Recent Labs  Lab 06/24/18 0409  AST 25  ALT 9  ALKPHOS 59  BILITOT 0.4  PROT 4.9*  ALBUMIN 2.1*   CBC: Recent Labs  Lab 06/24/18 0409 06/25/18 0519  WBC 6.9 13.5*  HGB 9.0* 10.4*  HCT 28.5* 31.3*  MCV 93.4 91.0  PLT 145* 162   Cardiac Enzymes: Recent Labs  Lab 06/24/18 0409  CKTOTAL 414*   CBG: Recent Labs  Lab 06/27/18 1119 06/27/18 1626 06/27/18 1954 06/27/18 2352 06/28/18 0411  GLUCAP 195* 185* 166* 202* 203*   Urine analysis:    Component Value Date/Time   COLORURINE YELLOW 06/24/2018 0546   APPEARANCEUR CLOUDY (A) 06/24/2018 0546   LABSPEC 1.012 06/24/2018 0546   PHURINE 5.0 06/24/2018 0546   GLUCOSEU 150 (A) 06/24/2018 0546   HGBUR SMALL (A) 06/24/2018 0546   BILIRUBINUR NEGATIVE 06/24/2018 0546   KETONESUR NEGATIVE 06/24/2018 0546   PROTEINUR NEGATIVE 06/24/2018 0546   UROBILINOGEN 0.2 11/20/2012  1254   NITRITE NEGATIVE 06/24/2018 0546   LEUKOCYTESUR LARGE (A) 06/24/2018 0546    Recent Results (  from the past 240 hour(s))  Culture, Urine     Status: Abnormal   Collection Time: 06/24/18  5:46 AM  Result Value Ref Range Status   Specimen Description   Final    URINE, CATHETERIZED Performed at [facility], [address], [place], [place] [address]    Special Requests   Final    NONE Performed at [facility], [address], [place], [place] [address]    Culture >=100,000 COLONIES/mL YEAST (A)  Final   Report Status 06/26/2018 FINAL  Final     Scheduled Meds:  glycopyrrolate  0.2 mg Intravenous QID   rotigotine  1 patch Transdermal Daily   scopolamine  1 patch Transdermal Q72H   Continuous Infusions:  morphine 1 mg/hr (06/20/2018 0600)    Procedures/Studies: Dg Abd 1 View  Result Date: 06/21/2018 CLINICAL DATA:  Check gastric catheter placement EXAM: ABDOMEN - 1 VIEW COMPARISON:  None. FINDINGS: Scattered large and small bowel gas is noted. Degenerative changes of lumbar spine are noted with scoliosis concave to the right. Check gastric catheter is noted in the mid esophagus at the superior aspect of the film. This could be advanced several cm to reach the stomach. IMPRESSION: Gastric catheter in the mid esophagus. Electronically Signed   By: Alcide Clever M.D.   On: 06/21/2018 15:47   Ct Head Wo Contrast  Result Date: 06/20/2018 CLINICAL DATA:  85 year old with altered mental status. EXAM: CT HEAD WITHOUT CONTRAST TECHNIQUE: Contiguous axial images were obtained from the base of the skull through the vertex without intravenous contrast. COMPARISON:  13-Jul-2018 FINDINGS: Brain: Stable cerebral atrophy. Negative for acute hemorrhage, mass lesion, midline shift, hydrocephalus or large infarct. Again noted are hypodensities in the white matter suggesting chronic changes. Focal low-density in the right frontal-parietal white matter is compatible with old ischemic changes.  Patient's head rotated on this examination. Vascular: No hyperdense vessel or unexpected calcification. Skull: Normal. Negative for fracture or focal lesion. Sinuses/Orbits: No acute finding. Other: None. IMPRESSION: 1. No acute intracranial abnormality. 2. Stable atrophy and chronic white matter changes. Findings are most compatible with chronic small vessel ischemic changes. Electronically Signed   By: Richarda Overlie M.D.   On: 06/20/2018 15:55   Ct Head Wo Contrast  Result Date: 07-13-18 CLINICAL DATA:  85 year old female with a history of altered mental status EXAM: CT HEAD WITHOUT CONTRAST TECHNIQUE: Contiguous axial images were obtained from the base of the skull through the vertex without intravenous contrast. COMPARISON:  12/15/2012 FINDINGS: Brain: No acute intracranial hemorrhage. No midline shift or mass effect. Gray-white differentiation maintained. Progression of senescent volume loss. Patchy hypodensities in the periventricular white matter. Unremarkable appearance of the ventricular system. Vascular: Dense calcifications of anterior and posterior circulation. Skull: No acute fracture.  No aggressive bone lesion identified. Sinuses/Orbits: Unremarkable appearance of the orbits. Mastoid air cells clear. No middle ear effusion. No significant sinus disease. Other: Endotracheal tube in position.  Fluid in the nasopharynx IMPRESSION: Negative for acute intracranial abnormality. Senescent volume loss and evidence of chronic microvascular ischemic disease. Electronically Signed   By: Gilmer Mor D.O.   On: 07/13/18 14:11   Dg Chest Port 1 View  Result Date: 06/26/2018 CLINICAL DATA:  85 year old female with respiratory failure in cephalopathy, recently negative for COVID-19. EXAM: PORTABLE CHEST 1 VIEW COMPARISON:  06/25/2018 and earlier. FINDINGS: Portable AP upright view at 0540 hours. Stable lines and tubes. Stable lung volumes. The patient remains mildly rotated to the right. No pneumothorax,  pulmonary edema, pleural  effusion or consolidation. Mild infrahilar atelectasis suspected. Paucity bowel gas in the upper abdomen. IMPRESSION: 1. Stable lines and tubes. 2. Continued mild atelectasis. Electronically Signed   By: Odessa Fleming M.D.   On: 06/26/2018 08:32   Dg Chest Port 1 View  Result Date: 06/25/2018 CLINICAL DATA:  85 year old female with encephalopathy, intubated. Negative for COVID-19 recently. EXAM: PORTABLE CHEST 1 VIEW COMPARISON:  06/24/2018 and earlier. FINDINGS: Portable AP upright views at 0554 hours. Stable endotracheal tube tip at the level the clavicles. Enteric tube courses to the abdomen, tip not included. Stable right PICC line. Mildly improved lung volumes. Stable cardiac size and mediastinal contours. No pneumothorax. Stable pulmonary vascularity without overt edema. No pleural effusion. Mild patchy lung base opacity most resembles atelectasis. Paucity of bowel gas in the upper abdomen. No acute osseous abnormality identified. IMPRESSION: 1. Stable lines and tubes. 2. Improved lung volumes with mild lung base atelectasis. Electronically Signed   By: Odessa Fleming M.D.   On: 06/25/2018 08:02   Dg Chest Port 1 View  Result Date: 06/24/2018 CLINICAL DATA:  Followup ventilator support EXAM: PORTABLE CHEST 1 VIEW COMPARISON:  06/22/2018 FINDINGS: Endotracheal tube tip is 3 cm above the carina. Orogastric or nasogastric tube extends at least to the distal esophagus. Right-sided central line tip at the SVC RA junction. The patient is rotated towards the right. The left lung appears clear. There appears to be volume loss and infiltrate in the right lung. IMPRESSION: Patient rotated towards the right. Possible worsening of infiltrate in volume loss in the right lung. Orogastric or nasogastric tube tip not clearly seen due to underpenetration. Electronically Signed   By: Paulina Fusi M.D.   On: 06/24/2018 10:40   Dg Chest Port 1 View  Result Date: 06/22/2018 CLINICAL DATA:  Respiratory  failure. EXAM: PORTABLE CHEST 1 VIEW COMPARISON:  06/21/2018 FINDINGS: The patient is rotated to the left. A right PICC has been placed and terminates over the lower SVC. An endotracheal tube terminates just below the clavicles, unchanged. An enteric tube is suboptimally visualized though now appears to extend into the upper abdomen. The cardiomediastinal silhouette is unchanged. Lung volumes remain low without evidence of airspace consolidation, edema, pleural effusion, pneumothorax. IMPRESSION: 1. Support devices including new right PICC as above. 2. Low lung volumes without evidence of acute airspace disease. Electronically Signed   By: Sebastian Ache M.D.   On: 06/22/2018 08:16   Dg Chest Port 1 View  Result Date: 06/21/2018 CLINICAL DATA:  Respiratory failure. EXAM: PORTABLE CHEST 1 VIEW COMPARISON:  Radiograph of Jun 20, 2018. FINDINGS: The heart size and mediastinal contours are within normal limits. Endotracheal tube is unchanged in position. Nasogastric tube appears to be looped within the distal esophagus with tip directed back up into the more proximal esophagus. No acute pulmonary disease is noted. No pneumothorax or pleural effusion is noted. Atherosclerosis of thoracic aorta is noted. The visualized skeletal structures are unremarkable. IMPRESSION: Endotracheal tube is unchanged in position. Nasogastric tube is looped within distal esophagus, with distal tip directed into the more proximal esophagus. No acute cardiopulmonary abnormality seen. Aortic Atherosclerosis (ICD10-I70.0). Electronically Signed   By: Lupita Raider M.D.   On: 06/21/2018 08:48   Dg Chest Port 1 View  Result Date: 06/20/2018 CLINICAL DATA:  85 year old female with repositioning of the endotracheal tube EXAM: PORTABLE CHEST 1 VIEW COMPARISON:  06/20/2018 FINDINGS: Cardiomediastinal silhouette unchanged in size and contour. No evidence of central vascular congestion. The endotracheal tube has been slightly  withdrawn, now  terminating 2.7 cm above the carina. There is a linear contour of the right lung of uncertain significance. There are overlying surgical lines tubes in EKG leads of the right chest somewhat obscuring evaluation. Low lung volumes. Pleuroparenchymal opacity of the right lung, may represent chronic changes and some pleural fluid. No new airspace disease. IMPRESSION: Endotracheal tube has been slightly withdrawn now terminating 2.7 cm above the carina. There is linear contour of the right lung extending in a convex configuration towards the costophrenic angle. While this is favored to represent artifact, a right pneumothorax cannot be excluded. Correlation with physical exam may be useful, as well as a repeat chest x-ray with adequate positioning and removal of the obscuring surgical line/hardware and EKG leads. These results were discussed by telephone at the time of interpretation on 06/20/2018 at 9:21 am with the nurse caring for the patient, Ms Shon Baton. Electronically Signed   By: Gilmer Mor D.O.   On: 06/20/2018 09:21   Dg Chest Port 1 View  Result Date: 06/20/2018 CLINICAL DATA:  Unresponsive. EXAM: PORTABLE CHEST 1 VIEW COMPARISON:  Radiograph Jun 19, 2018. FINDINGS: The heart size and mediastinal contours are within normal limits. Atherosclerosis of thoracic aorta is noted. Endotracheal tube is directed into right mainstem bronchus; withdrawal by 3-4 cm is recommended. Both lungs are clear. The visualized skeletal structures are unremarkable. IMPRESSION: Endotracheal tube directed into right mainstem bronchus; withdrawal by 3-4 cm is recommended. No acute cardiopulmonary abnormality seen. These results will be called to the ordering clinician or representative by the Radiologist Assistant, and communication documented in the PACS or zVision Dashboard. Electronically Signed   By: Lupita Raider M.D.   On: 06/20/2018 08:16   Dg Chest Portable 1 View  Result Date: 06/17/2018 CLINICAL DATA:   Unresponsive. EXAM: PORTABLE CHEST 1 VIEW COMPARISON:  06/27/2010 FINDINGS: 1301 hours. Endotracheal tube tip is approximately 1.4 cm above the base of the carina. The lungs are clear without focal pneumonia, edema, pneumothorax or pleural effusion. The cardiopericardial silhouette is within normal limits for size. The visualized bony structures of the thorax are intact. Telemetry leads overlie the chest. IMPRESSION: No active disease. Electronically Signed   By: Kennith Center M.D.   On: 06/29/2018 13:23   Dg Chest Port 1v Same Day  Result Date: 06/20/2018 CLINICAL DATA:  Intubated patient. Possible pneumothorax on plain film of the chest earlier today. EXAM: PORTABLE CHEST 1 VIEW COMPARISON:  Single-view of the chest earlier today. FINDINGS: Endotracheal tube is in place with the tip well above the carina. There is no pneumothorax. Finding described on the prior report was likely a skin fold. Lungs clear. Heart size normal. IMPRESSION: Negative for pneumothorax.  No acute disease. ETT in good position. Electronically Signed   By: Drusilla Kanner M.D.   On: 06/20/2018 10:16   Dg Vangie Bicker G Tube Plc W/fl W/rad  Result Date: 06/23/2018 CLINICAL DATA:  Difficulty advancing OG tube into the stomach EXAM: NASO G TUBE PLACEMENT WITH FL AND WITH RAD CONTRAST:  20 mL Omnipaque 300 FLUOROSCOPY TIME:  Fluoroscopy Time:  5 seconds Radiation Exposure Index (if provided by the fluoroscopic device): Number of Acquired Spot Images: 0 COMPARISON:  Abdomen film Jun 21, 2018 FINDINGS: Fluoroscopy over the lower chest and upper abdomen demonstrates the tube to have advanced since prior KUB with the tip in the proximal stomach. This was advanced slightly further with the tip now in the mid to distal stomach. Contrast was injected to confirm  placement. IMPRESSION: OG tube advanced into the mid to distal stomach. Electronically Signed   By: Charlett NoseKevin  Dover M.D.   On: 06/23/2018 15:49    Vassie Lollarlos Larayne Baxley, [name]  [facility] Pager  [phone]  2018-05-04, 10:16 AM   LOS: 11 days

## 2018-07-14 NOTE — Care Management Important Message (Signed)
Important Message  Patient Details  Name: Jennifer [name] MRN: [id number] Date of Birth: 04/08/1930   Medicare Important Message Given:  Yes    Corey Harold 07/01/2018, 3:33 PM

## 2018-07-14 NOTE — Progress Notes (Addendum)
Pt went asystole at 2313. Confirmed time of death by Rexford Maus RN and Ronie Spies RN. Patients daughter notified on her cell phone at [phone]. Pt daughter confirmed request to have her body sent to [facility] and expressed thanks for the care she received. Pt had an angel figurine and vase of silk flowers in room. Placed both items at the nurses desk.   This RN and Ronie Spies wasted 78 mL of morphine into the steri jug.

## 2018-07-14 DEATH — deceased

## 2018-08-27 ENCOUNTER — Ambulatory Visit: Payer: Medicare Other | Admitting: Cardiology

## 2020-08-22 IMAGING — CR PORTABLE CHEST - 1 VIEW
1 series · 1 of 1 positions shown · non-contrast
Comparison: 06/27/2010

CLINICAL DATA: Unresponsive.

EXAM:
PORTABLE CHEST 1 VIEW

[portable]
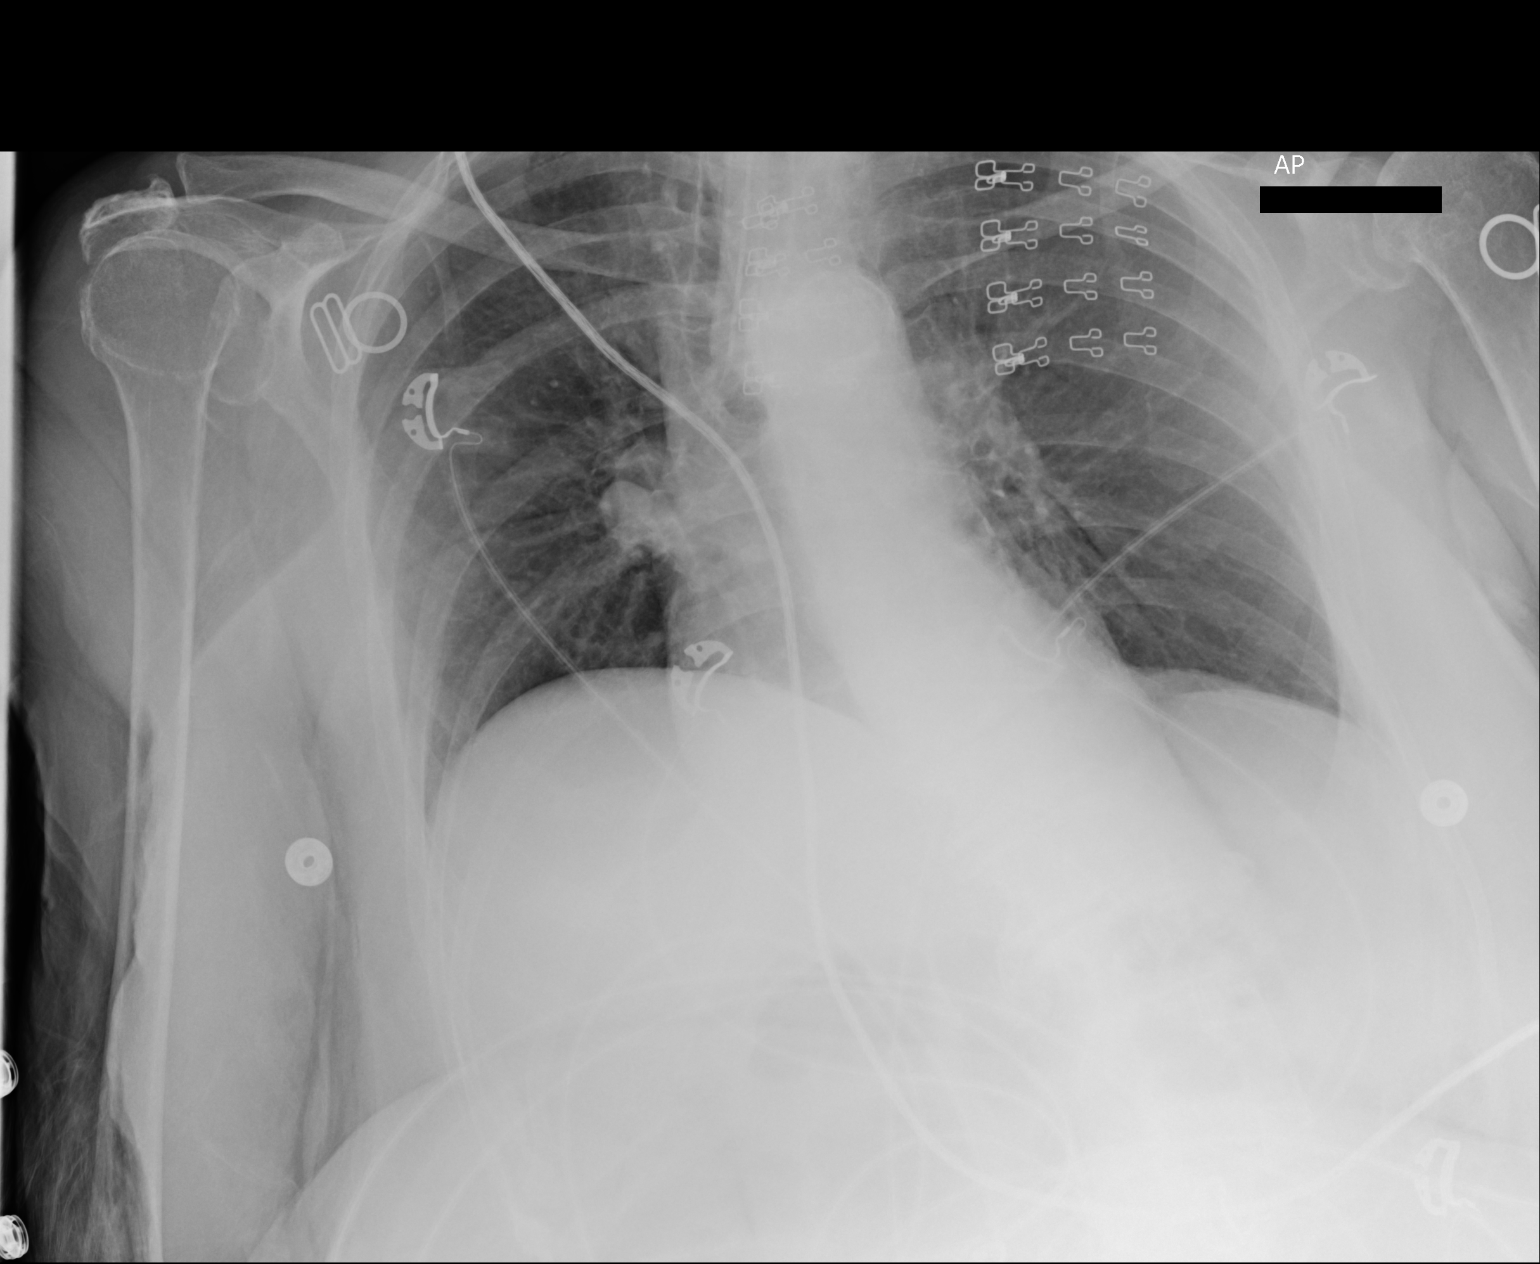

[1 of 1 positions shown; findings below may reference images not displayed]

FINDINGS: 1931 hours. Endotracheal tube tip is approximately 1.4 cm above the
base of the carina. The lungs are clear without focal pneumonia,
edema, pneumothorax or pleural effusion. The cardiopericardial
silhouette is within normal limits for size. The visualized bony
structures of the thorax are intact. Telemetry leads overlie the
chest.
IMPRESSION: No active disease.

## 2020-08-24 IMAGING — CR PORTABLE CHEST - 1 VIEW
2 series · 2 of 2 positions shown · non-contrast
Comparison: Radiograph June 20, 2018.

CLINICAL DATA: Respiratory failure.

EXAM:
PORTABLE CHEST 1 VIEW

[portable (1 of 2)]
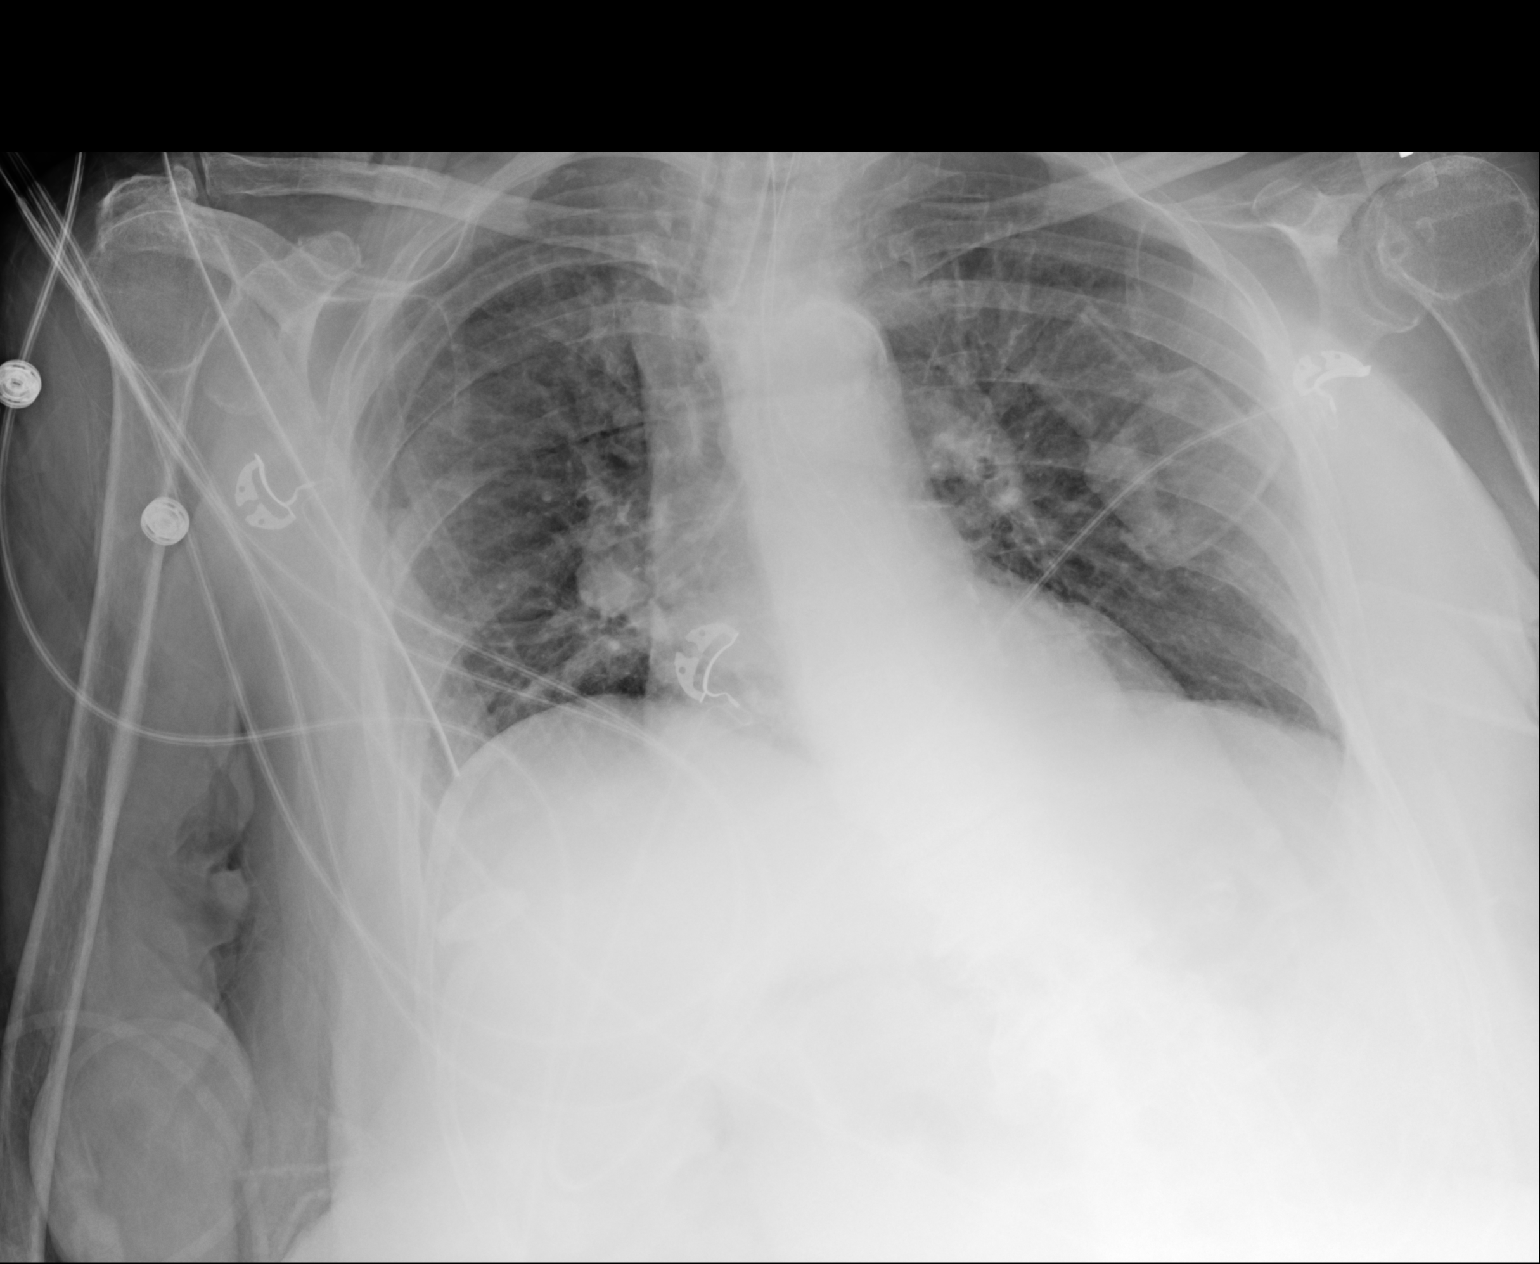

[portable (2 of 2)]
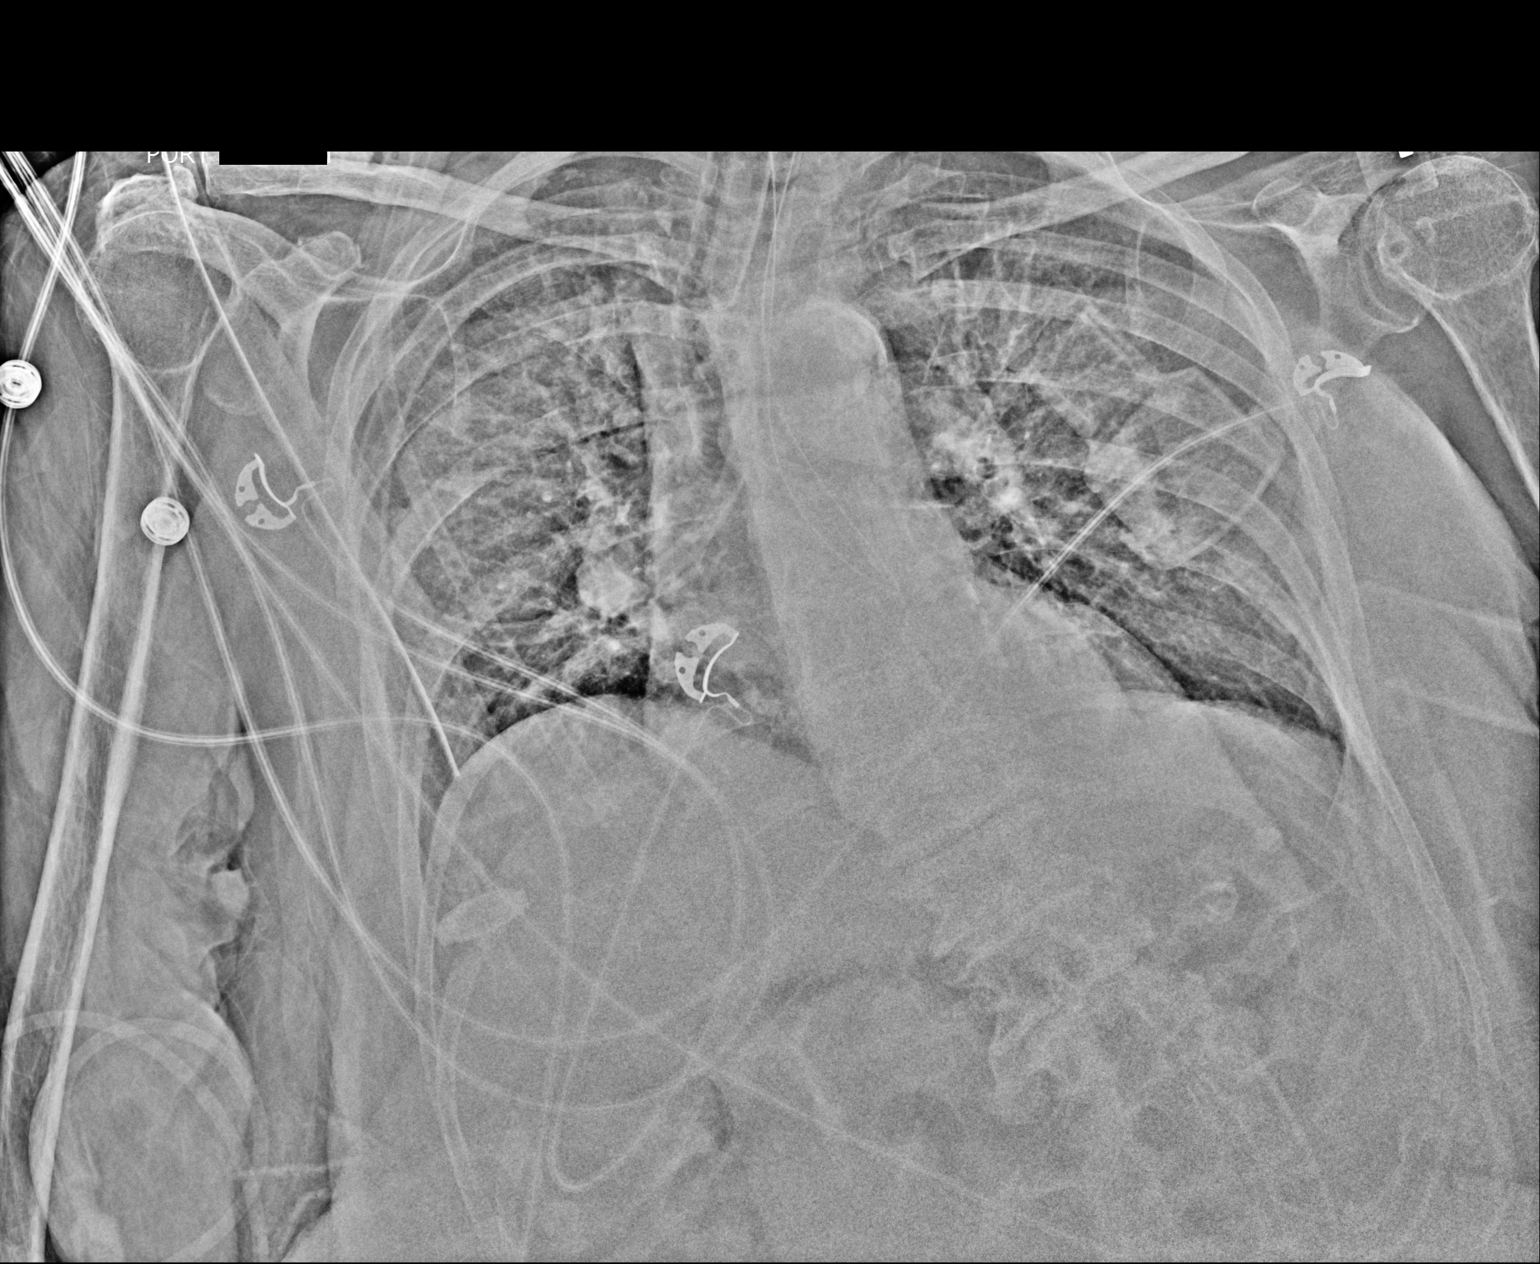

[2 of 2 positions shown; findings below may reference images not displayed]

FINDINGS: The heart size and mediastinal contours are within normal limits.
Endotracheal tube is unchanged in position. Nasogastric tube appears
to be looped within the distal esophagus with tip directed back up
into the more proximal esophagus. No acute pulmonary disease is
noted. No pneumothorax or pleural effusion is noted. Atherosclerosis
of thoracic aorta is noted. The visualized skeletal structures are
unremarkable.
IMPRESSION: Endotracheal tube is unchanged in position. Nasogastric tube is
looped within distal esophagus, with distal tip directed into the
more proximal esophagus. No acute cardiopulmonary abnormality seen.

Aortic Atherosclerosis ([id number]).

## 2020-08-24 IMAGING — CR ABDOMEN - 1 VIEW
2 series · 2 of 2 positions shown · non-contrast
Comparison: None.

CLINICAL DATA: Check gastric catheter placement

EXAM:
ABDOMEN - 1 VIEW

[supine ap (1 of 2)]
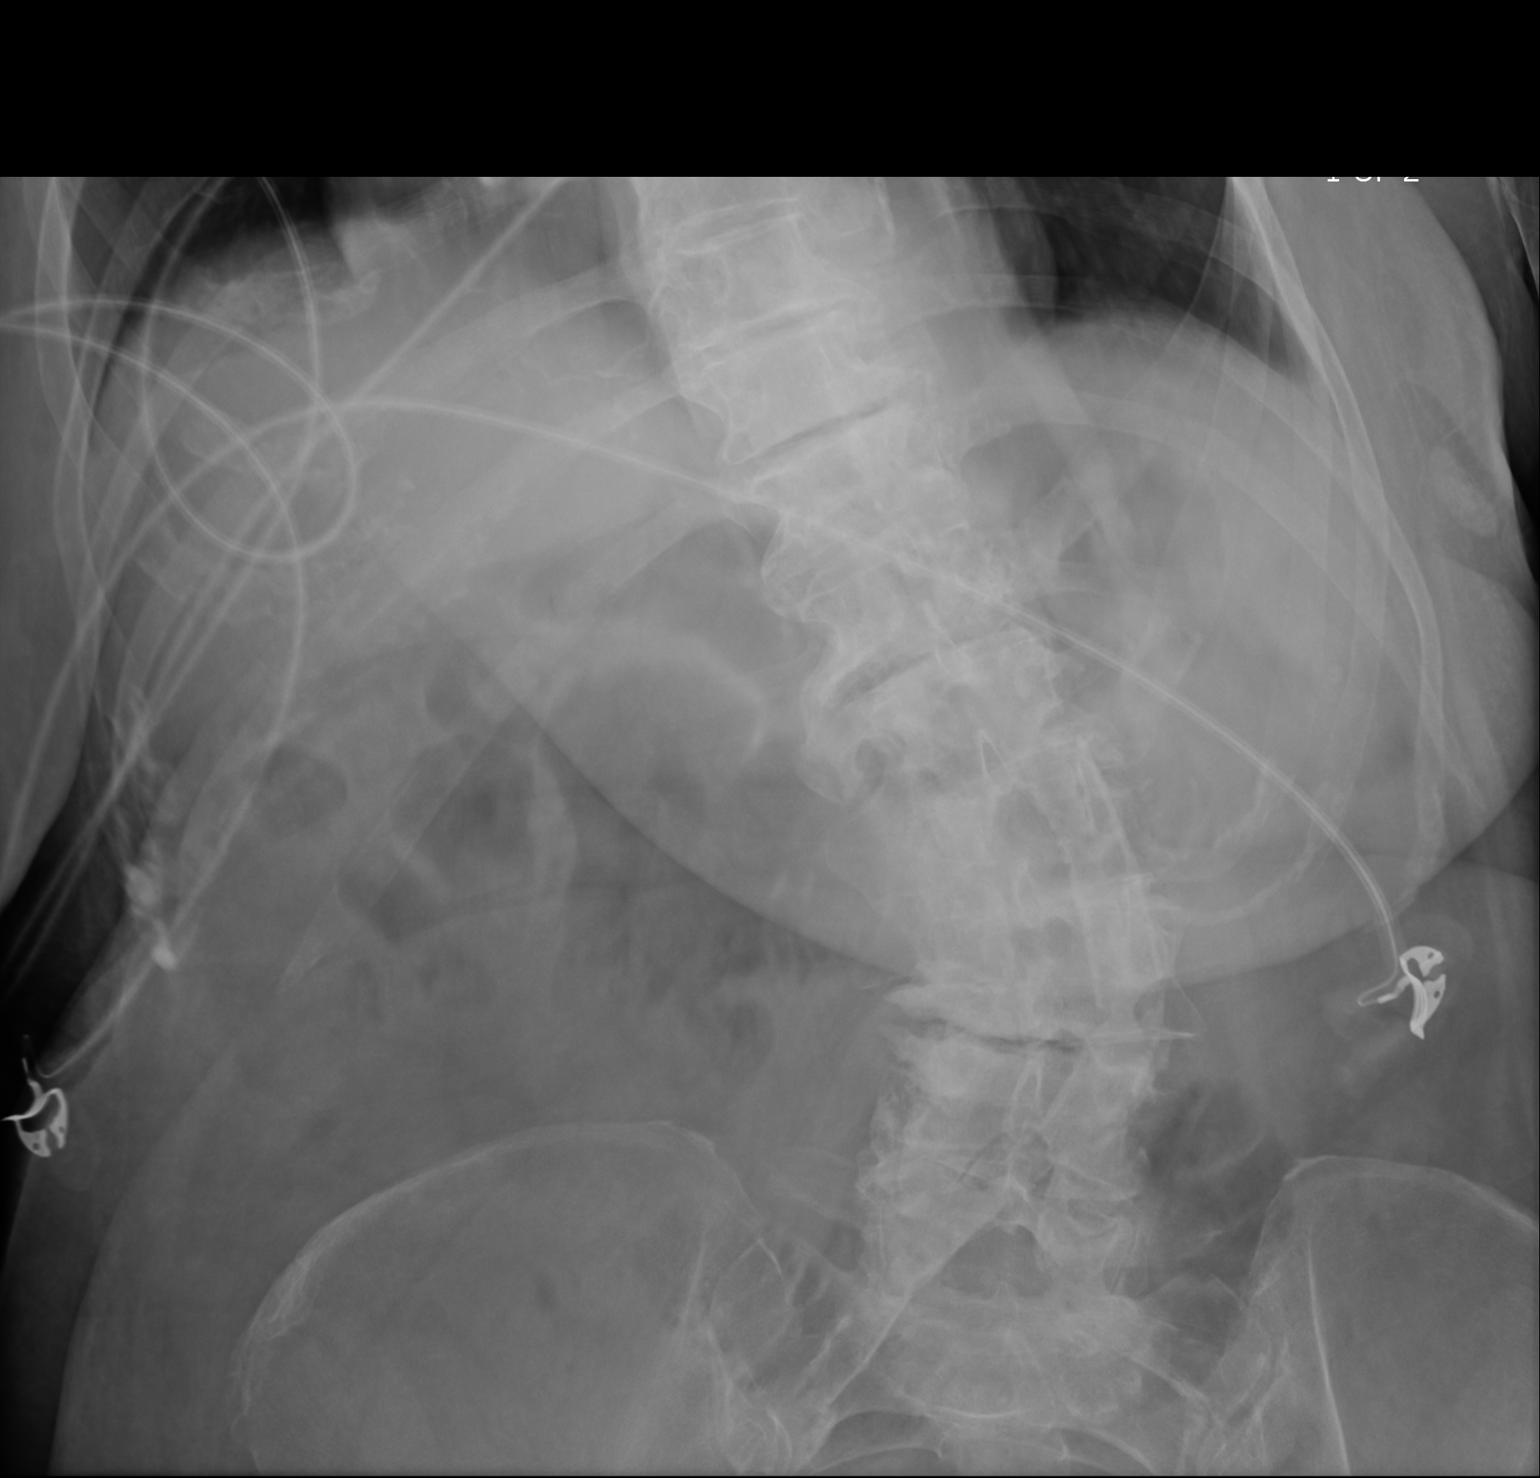

[supine ap (2 of 2)]
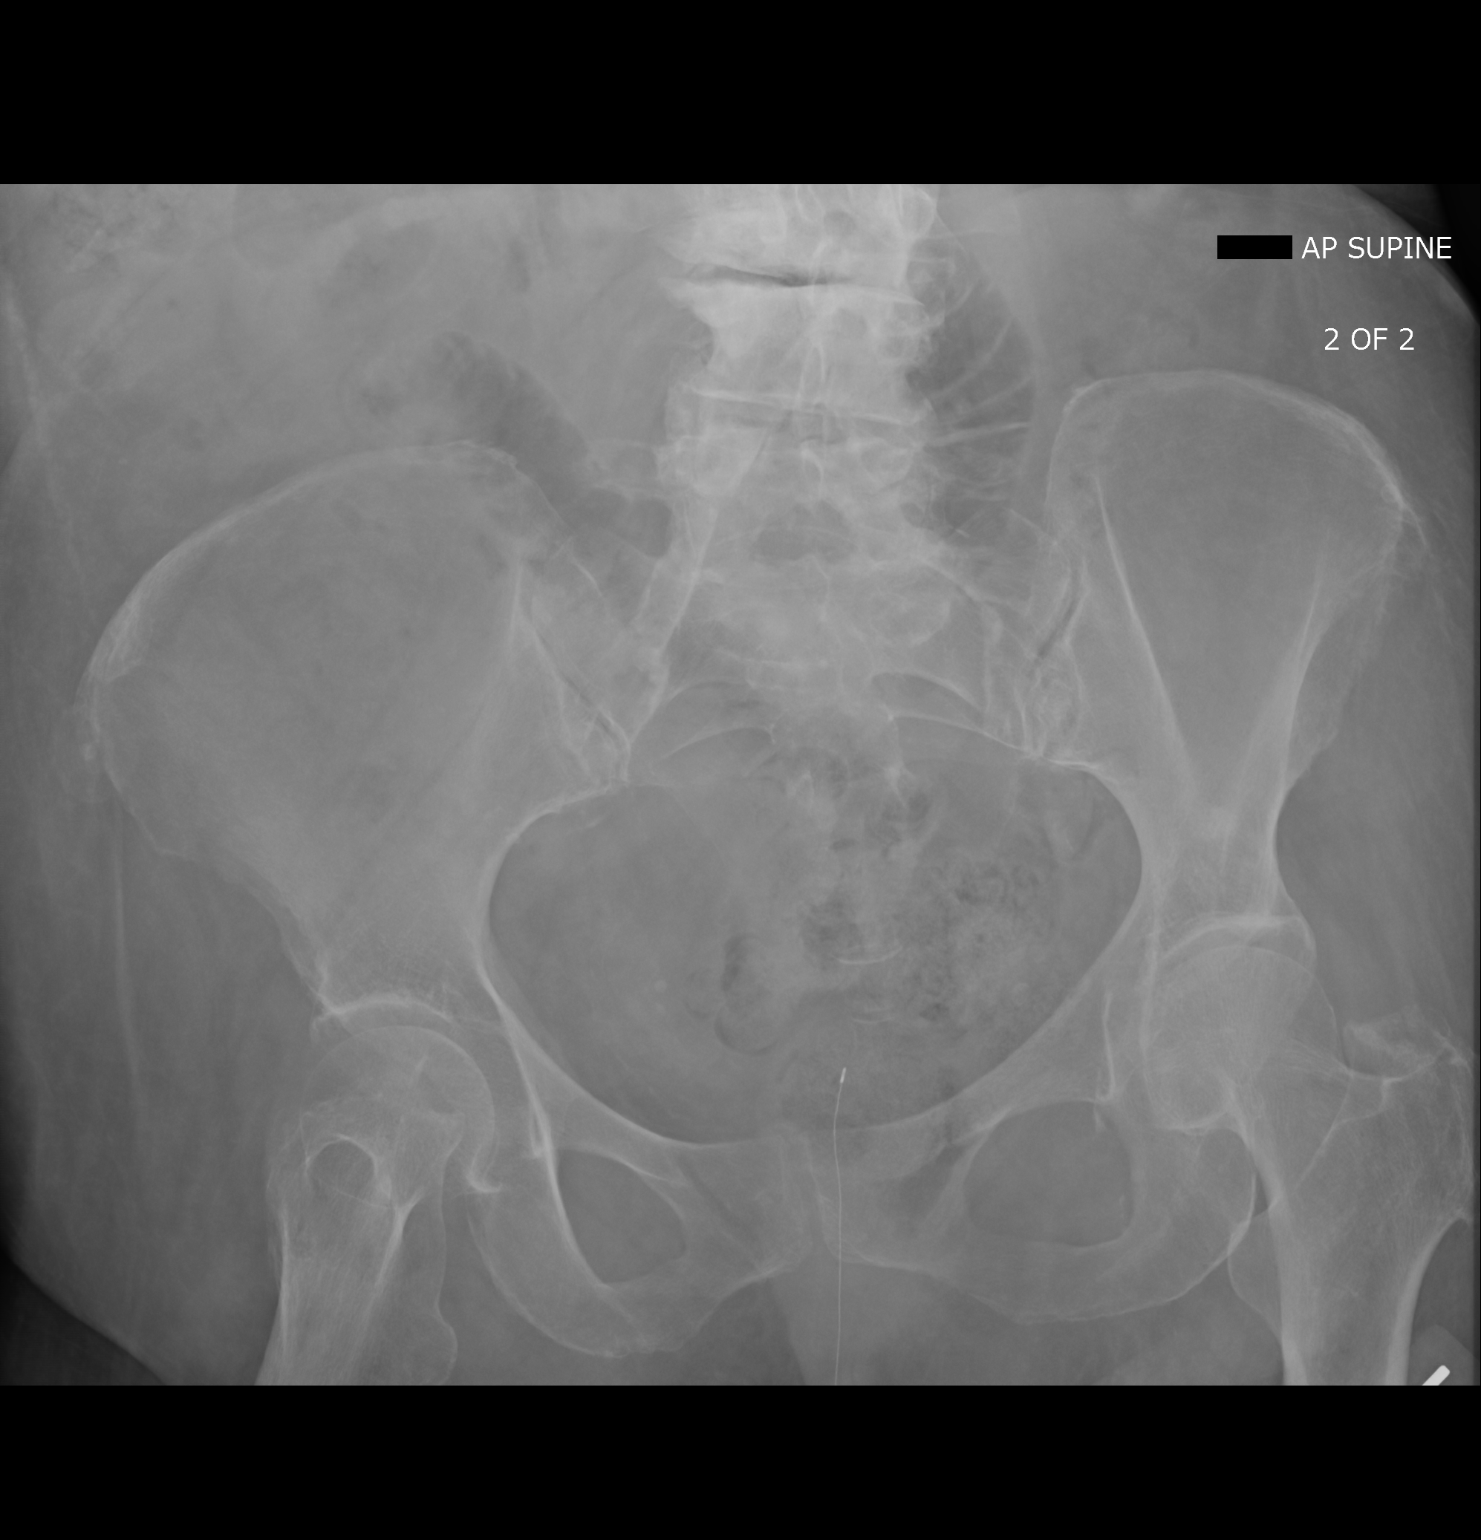

[2 of 2 positions shown; findings below may reference images not displayed]

FINDINGS: Scattered large and small bowel gas is noted. Degenerative changes
of lumbar spine are noted with scoliosis concave to the right. Check
gastric catheter is noted in the mid esophagus at the superior
aspect of the film. This could be advanced several cm to reach the
stomach.
IMPRESSION: Gastric catheter in the mid esophagus.
